# Patient Record
Sex: Female | Born: 1935 | Race: Black or African American | Hispanic: No | Smoking: Never smoker
Health system: Southern US, Community
[De-identification: ages and names within clinical notes are randomized; demographics above are authoritative.]

## PROBLEM LIST (undated history)

## (undated) DIAGNOSIS — C50919 Malignant neoplasm of unspecified site of unspecified female breast: Secondary | ICD-10-CM

## (undated) DIAGNOSIS — M2550 Pain in unspecified joint: Secondary | ICD-10-CM

## (undated) DIAGNOSIS — I4729 Other ventricular tachycardia: Secondary | ICD-10-CM

## (undated) DIAGNOSIS — I471 Supraventricular tachycardia, unspecified: Secondary | ICD-10-CM

## (undated) DIAGNOSIS — K5900 Constipation, unspecified: Secondary | ICD-10-CM

## (undated) DIAGNOSIS — I10 Essential (primary) hypertension: Secondary | ICD-10-CM

## (undated) DIAGNOSIS — I472 Ventricular tachycardia: Secondary | ICD-10-CM

## (undated) DIAGNOSIS — E7800 Pure hypercholesterolemia, unspecified: Secondary | ICD-10-CM

## (undated) DIAGNOSIS — R1013 Epigastric pain: Secondary | ICD-10-CM

## (undated) DIAGNOSIS — R42 Dizziness and giddiness: Secondary | ICD-10-CM

## (undated) DIAGNOSIS — I4510 Unspecified right bundle-branch block: Secondary | ICD-10-CM

## (undated) DIAGNOSIS — K219 Gastro-esophageal reflux disease without esophagitis: Secondary | ICD-10-CM

## (undated) HISTORY — PX: BREAST LUMPECTOMY: SHX2

## (undated) HISTORY — PX: TONSILLECTOMY: SUR1361

## (undated) HISTORY — PX: EYE SURGERY: SHX253

## (undated) HISTORY — DX: Malignant neoplasm of unspecified site of unspecified female breast: C50.919

## (undated) HISTORY — PX: CHOLECYSTECTOMY: SHX55

---

## 2000-03-21 ENCOUNTER — Encounter: Admission: RE | Admit: 2000-03-21 | Discharge: 2000-03-21 | Payer: Self-pay | Admitting: Oncology

## 2000-03-21 ENCOUNTER — Encounter: Payer: Self-pay | Admitting: Oncology

## 2000-10-10 ENCOUNTER — Other Ambulatory Visit: Admission: RE | Admit: 2000-10-10 | Discharge: 2000-10-10 | Payer: Self-pay | Admitting: Family Medicine

## 2001-03-23 ENCOUNTER — Encounter: Admission: RE | Admit: 2001-03-23 | Discharge: 2001-03-23 | Payer: Self-pay | Admitting: Oncology

## 2001-03-23 ENCOUNTER — Encounter: Payer: Self-pay | Admitting: Oncology

## 2004-06-02 ENCOUNTER — Ambulatory Visit (HOSPITAL_COMMUNITY): Admission: RE | Admit: 2004-06-02 | Discharge: 2004-06-02 | Payer: Self-pay | Admitting: Obstetrics

## 2005-03-03 ENCOUNTER — Ambulatory Visit (HOSPITAL_COMMUNITY): Admission: RE | Admit: 2005-03-03 | Discharge: 2005-03-03 | Payer: Self-pay | Admitting: Ophthalmology

## 2005-05-05 ENCOUNTER — Ambulatory Visit (HOSPITAL_COMMUNITY): Admission: RE | Admit: 2005-05-05 | Discharge: 2005-05-05 | Payer: Self-pay | Admitting: Orthopedic Surgery

## 2005-10-18 ENCOUNTER — Emergency Department (HOSPITAL_COMMUNITY): Admission: EM | Admit: 2005-10-18 | Discharge: 2005-10-18 | Payer: Self-pay | Admitting: Emergency Medicine

## 2006-06-19 ENCOUNTER — Emergency Department (HOSPITAL_COMMUNITY): Admission: EM | Admit: 2006-06-19 | Discharge: 2006-06-19 | Payer: Self-pay | Admitting: Emergency Medicine

## 2006-07-29 ENCOUNTER — Ambulatory Visit: Payer: Self-pay | Admitting: Family Medicine

## 2006-09-23 ENCOUNTER — Ambulatory Visit: Payer: Self-pay | Admitting: Family Medicine

## 2006-10-06 ENCOUNTER — Ambulatory Visit: Payer: Self-pay | Admitting: Family Medicine

## 2006-10-20 ENCOUNTER — Ambulatory Visit: Payer: Self-pay | Admitting: Family Medicine

## 2006-10-25 ENCOUNTER — Ambulatory Visit: Payer: Self-pay | Admitting: Cardiology

## 2006-11-01 ENCOUNTER — Ambulatory Visit: Payer: Self-pay

## 2006-11-08 ENCOUNTER — Ambulatory Visit: Payer: Self-pay | Admitting: Cardiology

## 2006-11-21 ENCOUNTER — Ambulatory Visit: Payer: Self-pay | Admitting: Family Medicine

## 2007-01-21 ENCOUNTER — Emergency Department (HOSPITAL_COMMUNITY): Admission: EM | Admit: 2007-01-21 | Discharge: 2007-01-21 | Payer: Self-pay | Admitting: Emergency Medicine

## 2007-01-26 ENCOUNTER — Ambulatory Visit: Payer: Self-pay | Admitting: Family Medicine

## 2008-10-29 ENCOUNTER — Emergency Department (HOSPITAL_COMMUNITY): Admission: EM | Admit: 2008-10-29 | Discharge: 2008-10-29 | Payer: Self-pay | Admitting: Emergency Medicine

## 2008-11-03 ENCOUNTER — Emergency Department (HOSPITAL_COMMUNITY): Admission: EM | Admit: 2008-11-03 | Discharge: 2008-11-03 | Payer: Self-pay | Admitting: Emergency Medicine

## 2009-05-14 ENCOUNTER — Emergency Department (HOSPITAL_COMMUNITY): Admission: EM | Admit: 2009-05-14 | Discharge: 2009-05-15 | Payer: Self-pay | Admitting: Emergency Medicine

## 2010-08-06 ENCOUNTER — Emergency Department (HOSPITAL_COMMUNITY)
Admission: EM | Admit: 2010-08-06 | Discharge: 2010-08-07 | Payer: Self-pay | Source: Home / Self Care | Admitting: Emergency Medicine

## 2010-08-06 LAB — DIFFERENTIAL
Basophils Absolute: 0 10*3/uL (ref 0.0–0.1)
Basophils Relative: 0 % (ref 0–1)
Eosinophils Absolute: 0.1 10*3/uL (ref 0.0–0.7)
Eosinophils Relative: 1 % (ref 0–5)
Lymphocytes Relative: 31 % (ref 12–46)
Lymphs Abs: 2 10*3/uL (ref 0.7–4.0)
Monocytes Absolute: 0.3 10*3/uL (ref 0.1–1.0)
Monocytes Relative: 5 % (ref 3–12)
Neutro Abs: 4 10*3/uL (ref 1.7–7.7)
Neutrophils Relative %: 63 % (ref 43–77)

## 2010-08-06 LAB — COMPREHENSIVE METABOLIC PANEL
ALT: 26 U/L (ref 0–35)
AST: 26 U/L (ref 0–37)
Albumin: 4.5 g/dL (ref 3.5–5.2)
Alkaline Phosphatase: 62 U/L (ref 39–117)
BUN: 13 mg/dL (ref 6–23)
CO2: 27 mEq/L (ref 19–32)
Calcium: 10 mg/dL (ref 8.4–10.5)
Chloride: 105 mEq/L (ref 96–112)
Creatinine, Ser: 0.78 mg/dL (ref 0.4–1.2)
GFR calc Af Amer: >60 mL/min (ref >60)
GFR calc non Af Amer: >60 mL/min (ref >60)
Glucose, Bld: 102 mg/dL — ABNORMAL HIGH (ref 70–99)
Potassium: 3.6 mEq/L (ref 3.5–5.1)
Sodium: 139 mEq/L (ref 135–145)
Total Bilirubin: 0.8 mg/dL (ref 0.3–1.2)
Total Protein: 8.5 g/dL — ABNORMAL HIGH (ref 6.0–8.3)

## 2010-08-06 LAB — POCT CARDIAC MARKERS
CKMB, poc: 6 ng/mL (ref 1.0–8.0)
Myoglobin, poc: 123 ng/mL (ref 12–200)
Troponin i, poc: <0.05 ng/mL (ref 0.00–0.09)

## 2010-08-06 LAB — CBC
HCT: 36.9 % (ref 36.0–46.0)
Hemoglobin: 12.5 g/dL (ref 12.0–15.0)
MCH: 28 pg (ref 26.0–34.0)
MCHC: 33.9 g/dL (ref 30.0–36.0)
MCV: 82.7 fL (ref 78.0–100.0)
Platelets: 155 10*3/uL (ref 150–400)
RBC: 4.46 MIL/uL (ref 3.87–5.11)
RDW: 13.9 % (ref 11.5–15.5)
WBC: 6.4 10*3/uL (ref 4.0–10.5)

## 2010-08-07 ENCOUNTER — Emergency Department (HOSPITAL_COMMUNITY)
Admission: EM | Admit: 2010-08-07 | Discharge: 2010-08-07 | Payer: Self-pay | Source: Home / Self Care | Admitting: Emergency Medicine

## 2010-11-05 LAB — LIPASE, BLOOD: Lipase: 18 U/L (ref 11–59)

## 2010-11-05 LAB — DIFFERENTIAL
Basophils Absolute: 0 10*3/uL (ref 0.0–0.1)
Basophils Relative: 1 % (ref 0–1)
Eosinophils Absolute: 0 10*3/uL (ref 0.0–0.7)
Eosinophils Relative: 0 % (ref 0–5)
Lymphocytes Relative: 22 % (ref 12–46)
Lymphs Abs: 1.3 10*3/uL (ref 0.7–4.0)
Monocytes Absolute: 0.3 10*3/uL (ref 0.1–1.0)
Monocytes Relative: 6 % (ref 3–12)
Neutro Abs: 4 10*3/uL (ref 1.7–7.7)
Neutrophils Relative %: 71 % (ref 43–77)

## 2010-11-05 LAB — COMPREHENSIVE METABOLIC PANEL
ALT: 19 U/L (ref 0–35)
AST: 20 U/L (ref 0–37)
Albumin: 4.3 g/dL (ref 3.5–5.2)
Alkaline Phosphatase: 58 U/L (ref 39–117)
BUN: 10 mg/dL (ref 6–23)
CO2: 27 mEq/L (ref 19–32)
Calcium: 9.5 mg/dL (ref 8.4–10.5)
Chloride: 103 mEq/L (ref 96–112)
Creatinine, Ser: 0.7 mg/dL (ref 0.4–1.2)
GFR calc Af Amer: >60 mL/min (ref >60)
GFR calc non Af Amer: >60 mL/min (ref >60)
Glucose, Bld: 126 mg/dL — ABNORMAL HIGH (ref 70–99)
Potassium: 3.6 mEq/L (ref 3.5–5.1)
Sodium: 136 mEq/L (ref 135–145)
Total Bilirubin: 0.6 mg/dL (ref 0.3–1.2)
Total Protein: 8.2 g/dL (ref 6.0–8.3)

## 2010-11-05 LAB — CBC
HCT: 40.6 % (ref 36.0–46.0)
Hemoglobin: 13.4 g/dL (ref 12.0–15.0)
MCHC: 32.9 g/dL (ref 30.0–36.0)
MCV: 86.8 fL (ref 78.0–100.0)
Platelets: 159 10*3/uL (ref 150–400)
RBC: 4.68 MIL/uL (ref 3.87–5.11)
RDW: 14.2 % (ref 11.5–15.5)
WBC: 5.6 10*3/uL (ref 4.0–10.5)

## 2010-12-17 ENCOUNTER — Emergency Department (HOSPITAL_COMMUNITY)
Admission: EM | Admit: 2010-12-17 | Discharge: 2010-12-17 | Disposition: A | Discharge status: Home / Self Care | Payer: Medicare Other | Attending: Emergency Medicine | Admitting: Emergency Medicine

## 2010-12-17 ENCOUNTER — Emergency Department (HOSPITAL_COMMUNITY): Payer: Medicare Other

## 2010-12-17 DIAGNOSIS — E7800 Pure hypercholesterolemia, unspecified: Secondary | ICD-10-CM | POA: Insufficient documentation

## 2010-12-17 DIAGNOSIS — D649 Anemia, unspecified: Secondary | ICD-10-CM | POA: Insufficient documentation

## 2010-12-17 DIAGNOSIS — I10 Essential (primary) hypertension: Secondary | ICD-10-CM | POA: Insufficient documentation

## 2010-12-17 DIAGNOSIS — Z79899 Other long term (current) drug therapy: Secondary | ICD-10-CM | POA: Insufficient documentation

## 2010-12-17 DIAGNOSIS — R110 Nausea: Secondary | ICD-10-CM | POA: Insufficient documentation

## 2010-12-17 DIAGNOSIS — R109 Unspecified abdominal pain: Secondary | ICD-10-CM | POA: Insufficient documentation

## 2010-12-17 LAB — DIFFERENTIAL
Basophils Absolute: 0 10*3/uL (ref 0.0–0.1)
Basophils Relative: 0 % (ref 0–1)
Eosinophils Absolute: 0 10*3/uL (ref 0.0–0.7)
Eosinophils Relative: 0 % (ref 0–5)
Lymphocytes Relative: 29 % (ref 12–46)
Lymphs Abs: 1.3 10*3/uL (ref 0.7–4.0)
Monocytes Absolute: 0.3 10*3/uL (ref 0.1–1.0)
Monocytes Relative: 6 % (ref 3–12)
Neutro Abs: 2.8 10*3/uL (ref 1.7–7.7)
Neutrophils Relative %: 64 % (ref 43–77)

## 2010-12-17 LAB — URINALYSIS, ROUTINE W REFLEX MICROSCOPIC
Bilirubin Urine: NEGATIVE
Glucose, UA: NEGATIVE mg/dL
Hgb urine dipstick: NEGATIVE
Ketones, ur: NEGATIVE mg/dL
Nitrite: NEGATIVE
Protein, ur: NEGATIVE mg/dL
Specific Gravity, Urine: 1.01 (ref 1.005–1.030)
Urobilinogen, UA: 0.2 mg/dL (ref 0.0–1.0)
pH: 7 (ref 5.0–8.0)

## 2010-12-17 LAB — COMPREHENSIVE METABOLIC PANEL
ALT: 22 U/L (ref 0–35)
AST: 26 U/L (ref 0–37)
Albumin: 4.5 g/dL (ref 3.5–5.2)
Alkaline Phosphatase: 68 U/L (ref 39–117)
BUN: 13 mg/dL (ref 6–23)
CO2: 28 mEq/L (ref 19–32)
Calcium: 10.7 mg/dL — ABNORMAL HIGH (ref 8.4–10.5)
Chloride: 100 mEq/L (ref 96–112)
Creatinine, Ser: 0.72 mg/dL (ref 0.4–1.2)
GFR calc Af Amer: >60 mL/min (ref >60)
GFR calc non Af Amer: >60 mL/min (ref >60)
Glucose, Bld: 129 mg/dL — ABNORMAL HIGH (ref 70–99)
Potassium: 3.8 mEq/L (ref 3.5–5.1)
Sodium: 136 mEq/L (ref 135–145)
Total Bilirubin: 0.6 mg/dL (ref 0.3–1.2)
Total Protein: 8.7 g/dL — ABNORMAL HIGH (ref 6.0–8.3)

## 2010-12-17 LAB — CBC
HCT: 38.7 % (ref 36.0–46.0)
Hemoglobin: 12.6 g/dL (ref 12.0–15.0)
MCH: 27.8 pg (ref 26.0–34.0)
MCHC: 32.6 g/dL (ref 30.0–36.0)
MCV: 85.4 fL (ref 78.0–100.0)
Platelets: 154 10*3/uL (ref 150–400)
RBC: 4.53 MIL/uL (ref 3.87–5.11)
RDW: 14.5 % (ref 11.5–15.5)
WBC: 4.5 10*3/uL (ref 4.0–10.5)

## 2010-12-17 LAB — URINE MICROSCOPIC-ADD ON

## 2010-12-17 LAB — LIPASE, BLOOD: Lipase: 27 U/L (ref 11–59)

## 2010-12-17 MED ORDER — IOHEXOL 300 MG/ML  SOLN
100.0000 mL | Freq: Once | INTRAMUSCULAR | Status: AC | PRN
  Administered 2010-12-17: 100 mL via INTRAVENOUS

## 2010-12-18 NOTE — Op Note (Signed)
[name], [name]             ACCOUNT NO.:  [id number]   MEDICAL RECORD NO.:  [id number]          PATIENT TYPE:  AMB   LOCATION:  DAY                           FACILITY:  [facility]   PHYSICIAN:  Trish Fountain, MD    DATE OF BIRTH:  Aug 15, 1919   DATE OF PROCEDURE:  05/05/2005  DATE OF DISCHARGE:  05/05/2005                                 OPERATIVE REPORT   PREOPERATIVE DIAGNOSIS:  Cataract, left eye.   POSTOPERATIVE DIAGNOSIS:  Cataract, left eye.   PROCEDURE:  Kelman phacoemulsification, left eye, with posterior chamber  intraocular lens, left eye.   ANESTHESIA:  MAC with topical anesthesia of the left eye.   SURGEON:  Trish Fountain, M.D.   SPECIMENS:  None.   COMPLICATIONS:  None.   INDICATIONS FOR PROCEDURE:  This is an 77 year old female with progressive  decreased vision in the left eye.   LENS MODEL:  The lens model #AMO CLRFLXC 21 diopter lens, serial  #[id number].   DESCRIPTION OF PROCEDURE:  In the preoperative area the patient had Cyclogyl  and Neo-Synephrine drops in the left eye in order to dilate the eye, along  with Tetracycline to help anesthetize the eye.  Once the patient's left eye  was dilated, the patient was taken to the operating room and prepped.  The  left eye was prepped and draped in the usual sterile manner.  A lid speculum  was placed in the left eye and a 2% Xylocaine jelly was placed in the left  eye as well.  A paracentesis was made through clear cornea at the limbus at  approximately the 11 o'clock position of the left eye.  Non-preserved  Xylocaine 1%, 1 mL was placed into the anterior chamber for one minute.  Viscoat was then used to fill the anterior chamber.  Using a 2.75 mm blade  at the 9 o'clock position, an incision into the anterior chamber was made  through clear cornea near the limbus.  Viscoat was again used to reform the  anterior chamber.  A #25 gauge bent capsulotomy needle was used to begin the  capsulorrhexis through  the anterior capsule of the lens.  Utrata forceps  were used to make a 360 degree anterior capsulorrhexis.  A Chang #27 gauge  irrigating cannula was used to hydrochlorothiazide-dissect and  hydrochlorothiazide-delineate the nucleus.  Once the hydrodissection and  hydrodelineation were carried out, [facility] phacoemulsification was used to  make a deep groove in the lens nucleus.  The lens was rotated 360 degrees  and divided into four quadrants using deep grooves made by  phacoemulsification with the [facility] phacoemulsification tip.  The nucleus  was then divided using the phaco tip and the nucleus manipulator.  The  nuclear quadrants were then removed using phacoemulsification.  The  irrigation aspiration was then used to remove the remainder of the cortex.  The anterior chamber and posterior capsule were filled with Provisc and the  9 o'clock position incision was slightly widened using the same 2.75 mm  blade that was initially used to make the incision.  An intraocular lens was  placed  in the shooter, and this was placed in the eye, followed by placement  of the trailing haptic into the posterior capsule, using the Kuglen.  The  irrigation/aspiration was then used to remove Provisc from the anterior  chamber and the posterior capsule.  BSS on a syringe was then used to  hydrate the cornea at the 9 o'clock incision site.  The incision site was  then checked for water tightness using a Weck-cel.  Half-strength Betadine  solution placed, one drop in the inner canthus and one drop in the outer  canthus.  After one minute this was rinsed from the eye.  Drops were placed  in the eye with Vigamox, followed by Nevanac, followed by Econopred.  A  shield was placed over the patient's left eye.   The patient was sent to the recovery room in satisfactory condition.      Trish Fountain, MD  Electronically Signed     PVK/MEDQ  D:  05/07/2005  T:  05/07/2005  Job:  [id number]

## 2010-12-18 NOTE — Op Note (Signed)
[name], [name]             ACCOUNT NO.:  [id number]   MEDICAL RECORD NO.:  [id number]          PATIENT TYPE:  AMB   LOCATION:  DAY                           FACILITY:  [facility]   PHYSICIAN:  Trish Fountain, MD    DATE OF BIRTH:  1919/11/07   DATE OF PROCEDURE:  03/03/2005  DATE OF DISCHARGE:                                 OPERATIVE REPORT   PREOPERATIVE DIAGNOSIS:  Cataract, right eye.   POSTOPERATIVE DIAGNOSIS:  Cataract, right eye.   SURGERY:  Kelman phacoemulsification, right eye, with posterior chamber  intraocular lens, right eye.   ANESTHESIA:  MAC with topical anesthesia of the right eye.   SURGEON:  Trish Fountain, MD   SPECIMENS:  None.   COMPLICATIONS:  None.   HISTORY:  This is an 77 year old female who has slowly progressive decrease  in vision in the right eye.   Lens model is AMO CLRFLXC 21.0 diopter lens, serial # [id number].   DESCRIPTION OF PROCEDURE:  In the preoperative area, the patient had  Cyclogyl and Neo-Synephrine drops in the right eye in order to dilate the  eye along with Tetracaine to help anesthetize the eye.  Once the patient's  right eye was dilated, the patient was taken to the operating room and  prepped.  The right eye was prepped and draped in the usual sterile manner.  A lid speculum was placed in the right eye, and 2% Xylocaine jelly was  placed in the right eye as well.  A paracentesis was made through clear  cornea at the limbus at approximately the 11 o'clock position of the right  eye.  Nonpreserved Xylocaine 1% 1 cc was placed into the anterior chamber  for one minute.  Viscoat was then used to fill the anterior chamber.  Using  a 2.75 mm blade at the 9 o'clock position, an incision into the anterior  chamber was made through clear cornea near the limbus.  Viscoat was again  used to reform the anterior chamber.  A 25 gauge bent capsulotomy needle was  used to begin the capsulorrhexis through the anterior capsule of the  lens.  Utrata forceps were used to make a 360 degree anterior capsulorrhexis.  A  Chang 27 gauge irrigating cannula was used to hydrodissect and  hydrodelineate the nucleus.  Once hydrodissection and hydrodelineation was  carried out, [facility] phacoemulsification was used to make a deep groove in  the lens nucleus.  The lens was rotated 360 degrees and divided into four  quadrants using deep grooves made by phacoemulsification with the [facility]  phacoemulsification tip.  The nucleus was then divided using the phaco tip  and the nucleus manipulator.  The nuclear quadrants were then removed using  phacoemulsification.  The irrigation aspiration was then used to remove the  remainder of the cortex.  The anterior chamber and posterior capsule was  filled with Provisc, and the 9 o'clock position incision was slightly  widened, using the same 2.75 mm blade that was initially used to make the  incision.  An intraocular lens was placed in the shooter, and this was  placed in the eye, followed by placement of the trailing haptic into the  posterior capsule, using the Kugelan.  Irrigation/aspiration was then used  to remove Provisc from the anterior chamber and the posterior capsule.  BSS  on a syringe was then used to hydrate the cornea at the 9 o'clock incision  site.  The incision site was then checked for water tightness, using a Weck-  cel.  Half-strength Betadine solution was placed, 1 drop, in the inner  canthus, and 1 drop in the outer canthus.  After one minute, this was rinsed  from the eye.  Drops were placed in the eye, Vigamox, followed by Nevanac  followed by Econopred.  A shield was placed over the patient's right eye,  and the patient was sent to the recovery room in satisfactory condition.       PVK/MEDQ  D:  03/04/2005  T:  03/05/2005  Job:  [id number]

## 2010-12-18 NOTE — Assessment & Plan Note (Signed)
[facility]                            CARDIOLOGY OFFICE NOTE   NAME:Dolin, ELHAM FINI                    MRN:          [id number]  DATE:11/08/2006                            DOB:          01-19-1920    Ms. Hirschmann comes back today to followup on her episodes of dizziness.  She was profoundly hypertension in the office and we restarted her on  amlodipine 5 mg a day. I also told her to stay well hydrated and to eat  regular meals.   She feels remarkably better, having no further spells.   We performed a stress Myoview, which showed an EF of 70% with normal  contractility and wall motion of all areas of myocardium. In some views,  it appeared that she had some attentuation in the distal anterior wall  and apex, which could have been mild ischemia. However, it was most  likely consistent with variable breast attenuation with normal wall  motion and a normal EF, it is unlikely that this is significant  ischemia.   Also performed carotid Dopplers which showed no obstructive disease with  good antegrade flow in both vertebrals.   I have had a nice chat today with Mrs. Fiala and her son, Roe Coombs. I have  asked her again to eat regular meals, to get up slowly before she starts  moving, take amlodipine 5 mg with careful monitoring of her blood  pressure (she says it is running around 130 systolic now), and to let me  know if she is having any further episodes.   I also told her the greatest fear that I have is that she could fall.  This could mean a fractured hip. I have reinforced the fact that if she  feels like she is going to faint, she should sit down or lie down as  quickly and safely as possible.   Will see her back on a p.r.n. basis.     Thomas C. Daleen Squibb, MD, [facility]  Electronically Signed    TCW/MedQ  DD: 11/08/2006  DT: 11/08/2006  Job #: [id number]   cc:   Delaney Meigs, M.D.

## 2010-12-18 NOTE — Assessment & Plan Note (Signed)
[facility]                            CARDIOLOGY OFFICE NOTE   NAME:[name], [name]                    MRN:          [id number]  DATE:10/25/2006                            DOB:          1920/04/13    I was asked by Dr. Lysbeth Galas to evaluate Maudie Flakes with episodes of  dizziness, and what she refers to as spells.   HISTORY OF PRESENT ILLNESS:  She is an 77 year old widowed African-  American female who comes with her son today for the evaluation of the  above complaints.   This has been going on for several months.  It will happen almost every  other day.  It generally happens when she has been up, doing things for  a while.  It does not seem to be related to position or changes in  posture.   She has no other symptoms including chest discomfort, shortness of  breath, diaphoresis, nausea and has never fainted with it.  She usually  will just sit down and rest for a few minutes, then she feels better.  She says it seems to be worse when she has not eaten.   She was recently put on amlodipine, unknown dose, along with her  metoprolol, for blood pressure.  Her last blood pressure in Dr. Joyce Copa  office on October 06, 2006, was 148/82.  She says this medicine made her  feel worse.   She said her blood pressure is always elevated in the doctor's office.   PAST MEDICAL HISTORY:  She has no allergies to medications.  She has no  dye allergies.  She does not smoke, drink, use any recreational drugs.  She does enjoy tea and coffee, but she says that they are decaffeinated.  She says she drinks plenty of water.  It does not sounds like she eats  on a regular basis.  She has lost her appetite.   PREVIOUS SURGERY:  Cholecystectomy and a lumpectomy, unknown dates.   FAMILY HISTORY:  Really noncontributory.   CURRENT MEDICATIONS:  1. Aspirin 81 mg daily.  2. Metoprolol 25 mg daily.  3. Benicar/HCT 40/12.5 daily.  4. Multivitamin daily.   SOCIAL  HISTORY:  She is widowed.  She has 5 children.  Her son is with  her today.   REVIEW OF SYSTEMS:  All systems reviewed and negative.   She had recent blood work which included a normal CBC, normal  electrolytes, normal TSH.  BUN, creatinine was 14 and 0.7.  Her LDL was  only 110.  Total was 184.  LFTs were normal.   PHYSICAL EXAMINATION:  Her blood pressures were checked numerous times  here in the office.  Lying it was 177/86, pulse was 69, and she is in  sinus rhythm.  Sitting, it went to 182/92 with no change in pulse.  Standing for 2 minutes, it went to 194/94 with a heart rate of 78, and  she felt a little lightheaded, and then we checked it at 5 minutes, it  was 195/96, and she was weak and lightheaded.  Her heart rate was 79 and  regular.  We then walked her around the office, and her blood pressure  was 198/98, pulse of 86 and regular.  HEENT:  Normocephalic, atraumatic.  PERRLA, extraocular movements  intact.  Arcus senilis, sclerae are slightly muddy.  Facial symmetry is  normal.  She had dentures.  NECK:  Supple.  Carotids are full without bruits.  There is no JVD.  Thyroid is not enlarged.  Trachea is midline.  LUNGS:  Clear to auscultation.  HEART:  Reveals a nondisplaced PMI.  She has normal S1, S2 with a very  faint murmur at the apex.  S2 splits.  ABDOMINAL EXAM:  Soft with good bowel sounds, no midline bruit.  There  is no hepatomegaly.  EXTREMITIES:  Reveal no edema.  Pulses are present.  NEUROLOGICAL EXAM:  Grossly intact.  SKIN:  Shows some chronic arthritic changes.   Her electrocardiogram shows sinus rhythm with a right bundle, which is  seen on previous ECG at Dr. Joyce Copa office.   ASSESSMENT:  1. Weak spells and dizziness, unknown etiology.  2. Hypertension.  I think this is poorly controlled, though she says      it may be white coat hypertension.   PLAN:  1. Adenosine Myoview to rule out any obstructive coronary disease.  2. Carotid Dopplers to  evaluate carotids as well as vertebrals.  3. Begin amlodipine 5 mg a day.  I told her this might make her feel a      little lightheaded at first, but her pressure needs to clearly come      down.  She will continue metoprolol, benazepril      hydrochlorothiazide.  I have encouraged her to drink more fluids.   I will schedule for a followup in 2 weeks to discuss tests, see how her  blood pressure is doing.     Thomas C. Daleen Squibb, MD, [facility]  Electronically Signed    TCW/MedQ  DD: 10/25/2006  DT: 10/25/2006  Job #: [id number]   cc:   Delaney Meigs, M.D.

## 2010-12-24 ENCOUNTER — Ambulatory Visit (INDEPENDENT_AMBULATORY_CARE_PROVIDER_SITE_OTHER): Payer: Medicare Other | Admitting: Internal Medicine

## 2010-12-24 DIAGNOSIS — Z7982 Long term (current) use of aspirin: Secondary | ICD-10-CM

## 2010-12-24 DIAGNOSIS — R112 Nausea with vomiting, unspecified: Secondary | ICD-10-CM

## 2011-01-18 NOTE — Consult Note (Signed)
[name], [name]             ACCOUNT NO.:  [id number]  MEDICAL RECORD NO.:  [id number]           PATIENT TYPE:  LOCATION:                                 FACILITY:  PHYSICIAN:  Lionel December, M.D.    DATE OF BIRTH:  1920-06-22  DATE OF CONSULTATION:  12/24/2010 DATE OF DISCHARGE:                                CONSULTATION   REASON FOR CONSULTATION:  Nausea.  HISTORY OF PRESENT ILLNESS:  [name] is an 77 year old black female referred to our office from Dr. Despina Hidden.  She was recently seen in the emergency room for nausea and abdominal pain.  She underwent a CT scan of the abdomen and pelvis with contrast which revealed no acute abnormalities.  She has had a cholecystectomy with the post cholecystectomy, dilatation of the common bile duct, atherosclerosis, unchanged small left inferior pole renal cyst.  She has had a calcified necrotic uterus, uterine fibroid with fluid within the superior uterine fundus.  This probably relates outflow obstruction, however, a follow up nonemergent transvaginal ultrasound recommended to further characterize. Cervical stenosis is another possibility causes endometrial fluid.  The common bile duct was not measured.  She was seen by Dr. Despina Hidden and then referred to our office.  [name] states she cannot eat chocolate or greasy foods.  At times, she becomes so nauseated.  She feels like she will pass out.  She has been seen Dr. Madilyn Fireman in [place] who is a gastroenterologist.  She saw him 5-6 weeks ago for this.  She was given a GERD diet.  When she was in the emergency department recently, she was given samples of Dexilant.  She has been on Prilosec which is actually not helped any.  She says she is not having any pain.  She usually has nausea about every other day, but she has not vomited.  She has actually had these symptoms for over 2 years.  She says she has been on multiple PPIs and they have not helped.  Her friend states [name] has  lost about 15 pounds over the past year.  She has had a colonoscopy less than 5 years ago in [place] and they reported it was normal.  She usually has one bowel movement a day.  She has had no rectal bleeding or melena.  ALLERGIES:  THERE ARE NO KNOWN ALLERGIES.  HOME MEDICATIONS:  Include 1. Norvasc 5 mg a day. 2. Dexilant 60 mg a day. 3. Crestor 20 mg a day. 4. Losartan 100 mg a day. 5. Aspirin 81 mg a day. 6. MVI one a day. 7. MiraLax once a day. 8. She is also taking lansoprazole 30 mg one a day.  SURGERIES:  She had a lumpectomy to her left breast 25 years ago and she had a cholecystectomy many years ago.  MEDICAL HISTORY:  Includes hypertension.  FAMILY HISTORY:  Mother and father are deceased, mother from a CVA, father from hypertension.  She has one brother and one sister in good health.  SOCIAL HISTORY:  She is widowed.  She is retired from [employer].  She does not smoke, drink or do drugs.  She had 5 children,  1 is deceased from lung cancer in his 30s and 4 are in good health.  OBJECTIVE:  VITAL SIGNS:  Her weight is 153, her height is 5 feet 2 inches, temperature is 98, blood pressure is 150/76, her pulse is 72. HEENT:  She has dentures.  Her oral mucosa is moist.  There are no lesions.  Her conjunctivae is pink.  Her sclerae are anicteric.  Her thyroid is normal.  There is no cervical lymphadenopathy. LUNGS:  Clear. HEART:  Regular rate and rhythm. ABDOMEN:  Soft.  Bowel sounds are positive.  No masses and there is no tenderness.  LABORATORY DATA:  Dec 17, 2010, lipase 27.  Urine was negative, small amount of leukocytes.  WBC count was 4.5, hemoglobin was 12.6, hematocrit was 38.7, MCV 85.4, platelets 154.  All the lab was normal on the CBC.  CMET, sodium 136, potassium 3.8, chloride 100, glucose 129, BUN 13, total bilirubin 0.6, ALP 68, AST 26, ALT 22, total protein was 8.7 and calcium was 10.7.  ASSESSMENT:  [name] is an 77 year old female presenting today  with nausea.  Suspect she may have peptic ulcer disease.  She has been on multiple PPIs which have not helped.  RECOMMENDATIONS: Diagnostic EGD in near future.  She will take Dexilant once a day.  Stop the Prevacid.  I would like to get H. pylori on her. I would also like for her to follow with the GERD diet that was given to her by Dr. Madilyn Fireman 6 weeks ago..  If her EGD is normal, further studies will be needed possibly a gastric emptying study.    ______________________________ Dorene Ar, NP   ______________________________ Lionel December, M.D.    TS/MEDQ  D:  12/24/2010  T:  12/25/2010  Job:  [id number] Copy to Dr. Despina Hidden  Electronically Signed by Dorene Ar PA on 12/31/2010 01:57:02 PM Electronically Signed by Lionel December M.D. on 01/18/2011 10:17:24 AM

## 2011-01-27 ENCOUNTER — Encounter (INDEPENDENT_AMBULATORY_CARE_PROVIDER_SITE_OTHER): Payer: Medicare Other | Admitting: Internal Medicine

## 2011-04-04 ENCOUNTER — Inpatient Hospital Stay (INDEPENDENT_AMBULATORY_CARE_PROVIDER_SITE_OTHER)
Admission: RE | Admit: 2011-04-04 | Discharge: 2011-04-04 | Disposition: A | Discharge status: Home / Self Care | Payer: Medicare Other | Source: Ambulatory Visit | Attending: Emergency Medicine | Admitting: Emergency Medicine

## 2011-04-04 DIAGNOSIS — H9209 Otalgia, unspecified ear: Secondary | ICD-10-CM

## 2011-04-04 DIAGNOSIS — H81399 Other peripheral vertigo, unspecified ear: Secondary | ICD-10-CM

## 2011-05-19 LAB — COMPREHENSIVE METABOLIC PANEL
ALT: 24
Calcium: 9.7
Creatinine, Ser: 0.72
GFR calc non Af Amer: >60
Glucose, Bld: 110 — ABNORMAL HIGH
Sodium: 134 — ABNORMAL LOW
Total Protein: 7.1

## 2011-05-19 LAB — URINALYSIS, ROUTINE W REFLEX MICROSCOPIC
Glucose, UA: NEGATIVE
Hgb urine dipstick: NEGATIVE
Specific Gravity, Urine: 1.02
pH: 6

## 2011-05-19 LAB — DIFFERENTIAL
Lymphocytes Relative: 20
Lymphs Abs: 1
Monocytes Relative: 7
Neutro Abs: 3.5
Neutrophils Relative %: 72

## 2011-05-19 LAB — CBC
Hemoglobin: 12.4
MCHC: 33.7
MCV: 83.4
RDW: 13.7

## 2011-05-19 LAB — URINE MICROSCOPIC-ADD ON

## 2011-07-21 ENCOUNTER — Encounter: Payer: Self-pay | Admitting: Emergency Medicine

## 2011-07-21 ENCOUNTER — Emergency Department (HOSPITAL_COMMUNITY)
Admission: EM | Admit: 2011-07-21 | Discharge: 2011-07-21 | Disposition: A | Discharge status: Home / Self Care | Payer: Medicare Other | Attending: Emergency Medicine | Admitting: Emergency Medicine

## 2011-07-21 DIAGNOSIS — I491 Atrial premature depolarization: Secondary | ICD-10-CM | POA: Insufficient documentation

## 2011-07-21 DIAGNOSIS — I10 Essential (primary) hypertension: Secondary | ICD-10-CM | POA: Insufficient documentation

## 2011-07-21 DIAGNOSIS — I4510 Unspecified right bundle-branch block: Secondary | ICD-10-CM | POA: Insufficient documentation

## 2011-07-21 DIAGNOSIS — K219 Gastro-esophageal reflux disease without esophagitis: Secondary | ICD-10-CM | POA: Insufficient documentation

## 2011-07-21 DIAGNOSIS — I498 Other specified cardiac arrhythmias: Secondary | ICD-10-CM | POA: Insufficient documentation

## 2011-07-21 HISTORY — DX: Essential (primary) hypertension: I10

## 2011-07-21 HISTORY — DX: Gastro-esophageal reflux disease without esophagitis: K21.9

## 2011-07-21 MED ORDER — ONDANSETRON HCL 4 MG/2ML IJ SOLN
4.0000 mg | Freq: Once | INTRAMUSCULAR | Status: AC
  Administered 2011-07-21: 4 mg via INTRAVENOUS
  Filled 2011-07-21: qty 2

## 2011-07-21 MED ORDER — FAMOTIDINE 20 MG PO TABS
20.0000 mg | ORAL_TABLET | Freq: Once | ORAL | Status: AC
  Administered 2011-07-21: 20 mg via ORAL
  Filled 2011-07-21: qty 1

## 2011-07-21 MED ORDER — PANTOPRAZOLE SODIUM 40 MG IV SOLR
40.0000 mg | Freq: Once | INTRAVENOUS | Status: AC
  Administered 2011-07-21: 40 mg via INTRAVENOUS
  Filled 2011-07-21: qty 40

## 2011-07-21 MED ORDER — ONDANSETRON 4 MG PO TBDP: 4.0000 mg | ORAL_TABLET | Freq: Three times a day (TID) | ORAL | Status: AC | PRN

## 2011-07-21 NOTE — ED Notes (Signed)
Pt states nausea and "feeling like I'm going to pass out at times" began last night and has continued today. Pt denies any pain.

## 2011-07-21 NOTE — ED Notes (Signed)
C/o nausea onset yesterday-denies vomiting; c/o lightheadedness ("feels like I'm going to just black out") onset this morning. Denies pain. A&ox4; answers questions appropriately; in no distress.

## 2011-07-21 NOTE — ED Provider Notes (Signed)
Scribed for [employer]. Colon Branch, MD, the patient was seen in room APA07/APA07 . This chart was scribed by Ellie Lunch.   CSN: [id number] Arrival date & time: 07/21/2011  3:10 PM   First MD Initiated Contact with Patient 07/21/11 1514      Chief Complaint  Patient presents with  . Nausea/Dizziness     (Consider location/radiation/quality/duration/timing/severity/associated sxs/prior treatment) HPI [name] is a 77 y.o. female w h/o GERD who presents to the Emergency Department complaining of 1 day of sudden onset nausea. Nausea began yesterday and has been gradually worsening since onset. Pt ate a meal of salad and chicken before nausea began. Pt denies associated fever, chills, diarrhea, difficulty urinating, and CP. Pt treated nausea with her prescribed GERD medication with no improvement.       Past Medical History  Diagnosis Date  . Hypertension   . GERD (gastroesophageal reflux disease)     Past Surgical History  Procedure Date  . Cholecystectomy   . Breast lumpectomy     left breast    No family history on file.  History  Substance Use Topics  . Smoking status: Never Smoker   . Smokeless tobacco: Not on file  . Alcohol Use: No     Review of Systems 10 Systems reviewed and are negative for acute change except as noted in the HPI.   Allergies  Review of patient's allergies indicates no known allergies.  Home Medications   Current Outpatient Rx  Name Route Sig Dispense Refill  . AMLODIPINE BESYLATE 5 MG PO TABS Oral Take 5 mg by mouth every morning.      Marland Kitchen CETIRIZINE HCL 10 MG PO TABS Oral Take 10 mg by mouth every morning.      Marland Kitchen FLUTICASONE PROPIONATE 50 MCG/ACT NA SUSP Nasal Place 2 sprays into the nose daily.      Marland Kitchen LANSOPRAZOLE 30 MG PO CPDR Oral Take 30 mg by mouth daily after breakfast.      . LOSARTAN POTASSIUM 100 MG PO TABS Oral Take 100 mg by mouth every morning.      Marland Kitchen ROSUVASTATIN CALCIUM 5 MG PO TABS Oral Take 5 mg by mouth every  morning.        BP 146/74  Pulse 113  Temp(Src) 99.2 F (37.3 C) (Oral)  Resp 16  Wt 150 lb (68.04 kg)  SpO2 100%  Physical Exam  Nursing note and vitals reviewed. Constitutional: She is oriented to person, place, and time. She appears well-developed and well-nourished. No distress.  HENT:  Head: Normocephalic and atraumatic.  Right Ear: Tympanic membrane normal.  Left Ear: Tympanic membrane normal.  Mouth/Throat: Oropharynx is clear and moist.       Upper and lower dentures  Eyes: Conjunctivae are normal. No scleral icterus.  Neck: Neck supple.  Cardiovascular: Normal rate and regular rhythm.  Exam reveals no gallop and no friction rub.   No murmur heard. Pulmonary/Chest: Effort normal and breath sounds normal. No respiratory distress. She has no wheezes. She has no rales.  Abdominal: Soft. Bowel sounds are normal. There is no tenderness.  Musculoskeletal: She exhibits no edema.  Neurological: She is alert and oriented to person, place, and time.  Skin: Skin is warm and dry.  Psychiatric: She has a normal mood and affect.    ED Course  Procedures (including critical care time) DIAGNOSTIC STUDIES: Oxygen Saturation is 100% on room air, normal by my interpretation.     Date: 07/21/2011 1523  Rate: 106  Rhythm: sinus tachycardia and premature atrial contractions (PAC)  QRS Axis: normal  Intervals: normal  ST/T Wave abnormalities: normal  Conduction Disutrbances:right bundle branch block  Narrative Interpretation:   Old EKG Reviewed: unchanged c/w 08/06/10   COORDINATION OF CARE:   3:41 PM EDP discussed with PT certain food that can aggravate GERD. Discussed that nausea is likely caused by GERD. Recommended PT supplement medication with OTC mylanta.   No diagnosis found.    MDM  Patient with symptoms of worsening GERD since last night. Has taken her PPO without relief. Non focal PE. Given protonix and pepcid with relief. WIll d/c home with instructions for use of  mylanta as a supplement to her PPO.Pt feels improved after observation and/or treatment in ED.Pt stable in ED with no significant deterioration in condition.The patient appears reasonably screened and/or stabilized for discharge and I doubt any other medical condition or other [facility] requiring further screening, evaluation, or treatment in the ED at this time prior to discharge.  I personally performed the services described in this documentation, which was scribed in my presence. The recorded information has been reviewed and considered.   MDM Reviewed: nursing note and vitals         Nicoletta Dress. Colon Branch, MD 07/21/11 1640

## 2011-07-21 NOTE — ED Notes (Signed)
Left in c/o family for transport home; instructions given and reviewed; verbalizes understanding.

## 2011-09-25 ENCOUNTER — Other Ambulatory Visit: Payer: Self-pay

## 2011-09-25 ENCOUNTER — Emergency Department (HOSPITAL_BASED_OUTPATIENT_CLINIC_OR_DEPARTMENT_OTHER): Payer: Medicare Other

## 2011-09-25 ENCOUNTER — Inpatient Hospital Stay (HOSPITAL_BASED_OUTPATIENT_CLINIC_OR_DEPARTMENT_OTHER)
Admission: EM | Admit: 2011-09-25 | Discharge: 2011-09-28 | DRG: 310 | Disposition: A | Discharge status: Home / Self Care | Payer: Medicare Other | Attending: Cardiology | Admitting: Cardiology

## 2011-09-25 ENCOUNTER — Emergency Department (INDEPENDENT_AMBULATORY_CARE_PROVIDER_SITE_OTHER): Payer: Medicare Other

## 2011-09-25 ENCOUNTER — Encounter (HOSPITAL_BASED_OUTPATIENT_CLINIC_OR_DEPARTMENT_OTHER): Payer: Self-pay | Admitting: *Deleted

## 2011-09-25 DIAGNOSIS — I4510 Unspecified right bundle-branch block: Secondary | ICD-10-CM | POA: Diagnosis present

## 2011-09-25 DIAGNOSIS — R05 Cough: Secondary | ICD-10-CM

## 2011-09-25 DIAGNOSIS — I471 Supraventricular tachycardia, unspecified: Secondary | ICD-10-CM

## 2011-09-25 DIAGNOSIS — J069 Acute upper respiratory infection, unspecified: Secondary | ICD-10-CM | POA: Diagnosis present

## 2011-09-25 DIAGNOSIS — R5383 Other fatigue: Secondary | ICD-10-CM

## 2011-09-25 DIAGNOSIS — R5381 Other malaise: Secondary | ICD-10-CM

## 2011-09-25 DIAGNOSIS — I498 Other specified cardiac arrhythmias: Principal | ICD-10-CM | POA: Diagnosis present

## 2011-09-25 DIAGNOSIS — E7800 Pure hypercholesterolemia, unspecified: Secondary | ICD-10-CM | POA: Diagnosis present

## 2011-09-25 DIAGNOSIS — I517 Cardiomegaly: Secondary | ICD-10-CM

## 2011-09-25 DIAGNOSIS — R110 Nausea: Secondary | ICD-10-CM

## 2011-09-25 DIAGNOSIS — E876 Hypokalemia: Secondary | ICD-10-CM | POA: Diagnosis present

## 2011-09-25 DIAGNOSIS — R0602 Shortness of breath: Secondary | ICD-10-CM

## 2011-09-25 DIAGNOSIS — D696 Thrombocytopenia, unspecified: Secondary | ICD-10-CM | POA: Diagnosis present

## 2011-09-25 DIAGNOSIS — K219 Gastro-esophageal reflux disease without esophagitis: Secondary | ICD-10-CM | POA: Diagnosis present

## 2011-09-25 DIAGNOSIS — D72819 Decreased white blood cell count, unspecified: Secondary | ICD-10-CM | POA: Diagnosis present

## 2011-09-25 DIAGNOSIS — I10 Essential (primary) hypertension: Secondary | ICD-10-CM

## 2011-09-25 DIAGNOSIS — E785 Hyperlipidemia, unspecified: Secondary | ICD-10-CM | POA: Diagnosis present

## 2011-09-25 DIAGNOSIS — K5900 Constipation, unspecified: Secondary | ICD-10-CM | POA: Diagnosis present

## 2011-09-25 DIAGNOSIS — R112 Nausea with vomiting, unspecified: Secondary | ICD-10-CM | POA: Diagnosis present

## 2011-09-25 HISTORY — DX: Supraventricular tachycardia, unspecified: I47.10

## 2011-09-25 HISTORY — DX: Other ventricular tachycardia: I47.29

## 2011-09-25 HISTORY — DX: Ventricular tachycardia: I47.2

## 2011-09-25 HISTORY — DX: Pure hypercholesterolemia, unspecified: E78.00

## 2011-09-25 HISTORY — DX: Unspecified right bundle-branch block: I45.10

## 2011-09-25 HISTORY — DX: Supraventricular tachycardia: I47.1

## 2011-09-25 LAB — BASIC METABOLIC PANEL
BUN: 17 mg/dL (ref 6–23)
CO2: 23 mEq/L (ref 19–32)
GFR calc non Af Amer: 55 mL/min — ABNORMAL LOW (ref >90)
Glucose, Bld: 130 mg/dL — ABNORMAL HIGH (ref 70–99)
Potassium: 3.3 mEq/L — ABNORMAL LOW (ref 3.5–5.1)
Sodium: 136 mEq/L (ref 135–145)

## 2011-09-25 LAB — CBC
Hemoglobin: 12.7 g/dL (ref 12.0–15.0)
MCV: 81.7 fL (ref 78.0–100.0)
Platelets: 92 10*3/uL — ABNORMAL LOW (ref 150–400)
RBC: 4.71 MIL/uL (ref 3.87–5.11)
RBC: 4.57 MIL/uL (ref 3.87–5.11)
RDW: 13.5 % (ref 11.5–15.5)
WBC: 2.1 10*3/uL — ABNORMAL LOW (ref 4.0–10.5)
WBC: 2.2 10*3/uL — ABNORMAL LOW (ref 4.0–10.5)

## 2011-09-25 LAB — CREATININE, SERUM
Creatinine, Ser: 0.78 mg/dL (ref 0.50–1.10)
GFR calc Af Amer: 83 mL/min — ABNORMAL LOW (ref >90)
GFR calc non Af Amer: 72 mL/min — ABNORMAL LOW (ref >90)

## 2011-09-25 LAB — CARDIAC PANEL(CRET KIN+CKTOT+MB+TROPI)
CK, MB: 4.1 ng/mL — ABNORMAL HIGH (ref 0.3–4.0)
Total CK: 160 U/L (ref 7–177)
Troponin I: <0.3 ng/mL (ref <0.30)

## 2011-09-25 LAB — MAGNESIUM: Magnesium: 1.9 mg/dL (ref 1.5–2.5)

## 2011-09-25 LAB — DIFFERENTIAL
Blasts: 0 %
Eosinophils Absolute: 0 10*3/uL (ref 0.0–0.7)
Eosinophils Relative: 0 % (ref 0–5)
Metamyelocytes Relative: 0 %
Monocytes Absolute: 0.2 10*3/uL (ref 0.1–1.0)
Monocytes Relative: 9 % (ref 3–12)
Myelocytes: 0 %
Neutro Abs: 1 10*3/uL — ABNORMAL LOW (ref 1.7–7.7)
Neutrophils Relative %: 47 % (ref 43–77)
nRBC: 0 /100 WBC

## 2011-09-25 LAB — PROCALCITONIN: Procalcitonin: <0.1 ng/mL

## 2011-09-25 LAB — PROTIME-INR
INR: 1.23 (ref 0.00–1.49)
Prothrombin Time: 15.8 seconds — ABNORMAL HIGH (ref 11.6–15.2)

## 2011-09-25 LAB — LACTIC ACID, PLASMA: Lactic Acid, Venous: 1.5 mmol/L (ref 0.5–2.2)

## 2011-09-25 LAB — MRSA PCR SCREENING: MRSA by PCR: NEGATIVE

## 2011-09-25 MED ORDER — SODIUM CHLORIDE 0.9 % IJ SOLN
3.0000 mL | Freq: Two times a day (BID) | INTRAMUSCULAR | Status: DC
  Administered 2011-09-27 (×2): 3 mL via INTRAVENOUS

## 2011-09-25 MED ORDER — SODIUM CHLORIDE 0.9 % IV SOLN: Freq: Once | INTRAVENOUS | Status: DC

## 2011-09-25 MED ORDER — SODIUM CHLORIDE 0.9 % IV SOLN: 250.0000 mL | INTRAVENOUS | Status: DC | PRN

## 2011-09-25 MED ORDER — DILTIAZEM HCL 100 MG IV SOLR: 5.0000 mg/h | Freq: Once | INTRAVENOUS | Status: DC

## 2011-09-25 MED ORDER — HEPARIN SODIUM (PORCINE) 5000 UNIT/ML IJ SOLN
5000.0000 [IU] | Freq: Three times a day (TID) | INTRAMUSCULAR | Status: DC
  Administered 2011-09-25 – 2011-09-28 (×8): 5000 [IU] via SUBCUTANEOUS
  Filled 2011-09-25 (×12): qty 1

## 2011-09-25 MED ORDER — SODIUM CHLORIDE 0.9 % IV SOLN
INTRAVENOUS | Status: DC
  Administered 2011-09-25: 20:00:00 via INTRAVENOUS

## 2011-09-25 MED ORDER — ASPIRIN EC 81 MG PO TBEC
81.0000 mg | DELAYED_RELEASE_TABLET | Freq: Every day | ORAL | Status: DC
  Administered 2011-09-26 – 2011-09-28 (×3): 81 mg via ORAL
  Filled 2011-09-25 (×3): qty 1

## 2011-09-25 MED ORDER — ATORVASTATIN CALCIUM 10 MG PO TABS
10.0000 mg | ORAL_TABLET | Freq: Every day | ORAL | Status: DC
  Administered 2011-09-25 – 2011-09-27 (×3): 10 mg via ORAL
  Filled 2011-09-25 (×4): qty 1

## 2011-09-25 MED ORDER — B COMPLEX-C PO TABS
1.0000 | ORAL_TABLET | Freq: Every day | ORAL | Status: DC
  Administered 2011-09-26 – 2011-09-28 (×3): 1 via ORAL
  Filled 2011-09-25 (×3): qty 1

## 2011-09-25 MED ORDER — ASPIRIN 81 MG PO CHEW: 324.0000 mg | CHEWABLE_TABLET | ORAL | Status: DC

## 2011-09-25 MED ORDER — DILTIAZEM HCL 25 MG/5ML IV SOLN
INTRAVENOUS | Status: AC
  Administered 2011-09-25: 10 mg via INTRAVENOUS
  Filled 2011-09-25: qty 5

## 2011-09-25 MED ORDER — SODIUM CHLORIDE 0.9 % IJ SOLN: 3.0000 mL | INTRAMUSCULAR | Status: DC | PRN

## 2011-09-25 MED ORDER — ASPIRIN 300 MG RE SUPP
300.0000 mg | RECTAL | Status: DC
  Filled 2011-09-25: qty 1

## 2011-09-25 MED ORDER — POTASSIUM CHLORIDE CRYS ER 20 MEQ PO TBCR
40.0000 meq | EXTENDED_RELEASE_TABLET | Freq: Once | ORAL | Status: AC
  Administered 2011-09-25: 40 meq via ORAL
  Filled 2011-09-25: qty 2

## 2011-09-25 MED ORDER — ADENOSINE 6 MG/2ML IV SOLN
INTRAVENOUS | Status: AC
  Filled 2011-09-25: qty 6

## 2011-09-25 MED ORDER — DILTIAZEM HCL 25 MG/5ML IV SOLN
10.0000 mg | Freq: Once | INTRAVENOUS | Status: AC
  Administered 2011-09-25: 10 mg via INTRAVENOUS

## 2011-09-25 MED ORDER — SODIUM CHLORIDE 0.9 % IV BOLUS (SEPSIS)
1000.0000 mL | Freq: Once | INTRAVENOUS | Status: AC
  Administered 2011-09-25: 1000 mL via INTRAVENOUS

## 2011-09-25 MED ORDER — ACETAMINOPHEN 325 MG PO TABS: 650.0000 mg | ORAL_TABLET | ORAL | Status: DC | PRN

## 2011-09-25 MED ORDER — DILTIAZEM HCL 100 MG IV SOLR
INTRAVENOUS | Status: AC
  Filled 2011-09-25: qty 100

## 2011-09-25 MED ORDER — DILTIAZEM HCL 100 MG IV SOLR
5.0000 mg/h | Freq: Once | INTRAVENOUS | Status: AC
  Administered 2011-09-25: 5 mg/h via INTRAVENOUS

## 2011-09-25 MED ORDER — ONDANSETRON HCL 4 MG/2ML IJ SOLN: 4.0000 mg | Freq: Four times a day (QID) | INTRAMUSCULAR | Status: DC | PRN

## 2011-09-25 MED ORDER — LOSARTAN POTASSIUM 50 MG PO TABS
100.0000 mg | ORAL_TABLET | Freq: Every day | ORAL | Status: DC
  Administered 2011-09-26 – 2011-09-28 (×3): 100 mg via ORAL
  Filled 2011-09-25 (×3): qty 2

## 2011-09-25 NOTE — H&P (Signed)
Primary Cardiologist: Dr. Valera Castle Corinda Gubler); last seen in 2008 per records  Primary Care Doctor: Dr. Lysbeth Galas  Patient Location: Room 2927  Chief Complaint: weakness/nausea  HPI:  Ms. Shackleford is a delightful 78 year old woman with a history of hypertension, GERD, and hypercholesterolemia who presents with nausea and fatigue.  Her present illness began approximately 1 week ago, on Sunday 2/17 when she developed a cough after going out in the cold the prior evening. The cough was non-productive, but persistent. She did not experience any fevers, chills, or night sweats. She developed nausea without overt abdominal pain and has had a poor appetite for most of the last week. No diarrhea or abdominal cramping. She has chronic constipation which responds to PRN Miralax. On the basis of her cough, she saw her primary care physician who prescribed an oral antibiotic on Tuesday 2/19. After taking a single dose of the prescribed antibiotic (unclear what particular medication she was prescribed), she developed significantly worsened nausea/vomiting and more fatigue. She stopped after one dose and began a course of Azithromycin (5 day Z-pack) on Wednesday 2/20. Wile she feels better than on Tuesday, her nausea persisted, as has her sense of fatigue. On the basis of continued nausea and poor oral intake, she presented to [facility] center for evaluation this afternoon. Of note, the patient denies any chest pain or discomfort, and similarly denies shortness of breath during the past week. No palpitations. No lightheadedness or orthostatic symptoms. No syncope. No PND, orthopnea, or leg swelling. No headaches or myalgias. No joint pain or skin rashes that she's noticed.   At [facility], she was found to be profoundly tachycardic, with a normal blood pressure. She was given a liter of normal saline and IV Diltiazem as a bolus followed by continuous infusion. After improvement in her heart rate,  she was subsequent transferred to [facility] for further evaluation and management.   Upon arrival to [facility], she reports feeling "much better than earlier today." She is without acute complaints. She currently denies nausea, chest pain, or shortness of breath. She is actually feeling hungry for the first time this week.   Past Medical History Hypertension Hypercholesterolemia GERD S/p left breast lumpectomy S/p cholecystectomy  Family History: Non-contributory to this presentation  Social History: Widow; with children & family in the area 7 family members accompanied her to [facility] from [employer] Lives alone and independently in her own home  Review of Systems: As per HPI; otherwise is comprehensively negative  Allergies: No Known Allergies  Medications (home): Aspirin 81mg  Amlodipine 5 mg Losartan 100 mg Rosuvastatin 5 mg Lansoprazole 30 mg  Cetirizine 10 mg daily (Zyrtec) Fluticasone 50 mcg nasal spray 2 sprays daily Vitamin B/C complex Fish Oil 1000 mg  Azithromycin 250mg  for 2 more days (through Monday 2/25)  Medications on transfer from [facility]: Diltiazem IV infusion S/p single dose potassium for replacement of K (3.3) S/p normal saline bolus (1 liter)  Exam:  Afebrile  HR  71 BP 145/88 RR 14 100% RA  GEN: no acute distress, no accessory muscle use HEENT: JVP 8cm H2O, no carotid bruits  CHEST: clear to auscultation bilaterally; no wheezing, no crackles CARDIAC: S1 S2, no murmurs/rubs; no RV heave; no rub  ABDOMEN: soft, non-tender, no bruits; normal bowel sounds EXT: warm, well-perfused, no edema NEURO: alert & oriented x3; no focal deficits detected SKIN: warm & dry  Select Labs:  WBC 2.1 Hgb 13  Plts 92K  Na  136  K 3.3 HCO3 23  BUN 17 Cr 0.9 Ca 9.4 Lactate 1.5 Troponin < 0.30   CXR: no evident infiltrates, effusions or edema  ECGs:  From [facility]: 1433 RBBB (QRS~130) with HR on 171; regular without evident P-wave activity though P's may  be buried under T waves; no obvious ST segment abnormalities 1438 RBBB; HR 165; otherwise same as at 1433 1440 normal sinus rhythm with 1st degree AV block (PR~210); RBBB; HR ~100 with rhytm strip at the end showing an abrupt transition back to tachycardia with loss of clearly defined P waves 1450 normal sinus; HR 85; RBBB; 1st degree AV block   Assessment/Plan  78 year old admirably-independent woman who presents after a week of what sounds most like a viral syndrome with associated cough and periodic nausea. Based on her clinical history and presentation to [facility], I suspect that she developed an SVT secondary to nausea and a degree of dehydration for poor PO intake from a viral syndrome this week. Out of an abundance of understandable caution, she was initiated on an oral antibiotic by her primary care physician this week. Reassuringly, based on a clear lung exam together with a reasonably benign looking chest radiograph and her general appearance absent fevers or hypoxia, I do not think she has a pneumonia. Instead, she most likely experienced a URI this week.   In the context of her SVT paroxysms earlier this afternoon: her exam offers no evidence of volume overload and her cardiovascular bedside exam is normal. I have no reason to suspect underlying  structural heart disease. I suspect that volume depletion and hypokalemia may have lowered her threshold. Thus, I think the primary management approach should focus on re-establishment of euvolemia, electrolyte replacement as needed, and encouragement of PO intake.  1) SVT paroxysms likely secondary to volume depletion and resolving viral syndrome - Received 1 liter normal saline prior to transfer from [facility]; will give her 100cc/hr of normal saline for a total of 1 liter overnight - Will encourage PO intake for maintenance of hydration, with plan to wean IV fluids by tomorrow (Sunday) - Currently on Diltiazem infusion at 5mg /hour. Once she is  volume replete, we can likely discontinue the drip tomorrow (Sunday). Her current medication regimen does not currently include a nodal agent. I am not yet convinced that she will ultimately need a nodal agent, since the underlying cause of her SVT paroxysms seems related to an acute, resolving trigger in the form of her dehydration & viral-syndrome. Should her SVT re-cur, or if there is concern for its recurrence going forward, then perhaps her anti-hypertensive regimen can be modified to include a nodal agent. Specifically, her amlodipine could be switched to a low, long-acting dose of Diltiazem. Alternatively, a low-dose beta-blocker could be employed.   2) Hypertension - Continue home regimen, including Losartan 100mg  daily - Currently on Diltiazem infusion. As I discuss above, I think she can be transitioned back to her home Amlodipine dose, perhaps as early as tomorrow (Sunday), depending on how she does overnight from the standpoint of SVT paroxysms.   3) Hyperlipidemia - Continue low-dose statin therapy and low-dose aspirin for primary prevention - I do not think her current symptoms are as a result of her statin  4) GERD - Continue Lansoprazole therapy per her home dose   I discussed my impressions with the patient who voice understanding. Her questions were answered to the best of my ability.  Zacarias Pontes, MD Cardiology Fellow On-Call [phone]

## 2011-09-25 NOTE — ED Notes (Addendum)
Called son Zeniyah Peaster and informed him of patient's room assignment

## 2011-09-25 NOTE — ED Notes (Signed)
Patient states that she is completing her second antibiotic for pneumonia symptoms developed last weekend, antibiotic changed since the first one made her nauseous, patient states that she was feeling weak all weak and nauseous today, during ekg patient changed from svt, to afib to NSR, nausea has subsided at this time

## 2011-09-25 NOTE — ED Notes (Addendum)
Pt states she got sick last Sunday and saw the Dr. On Tuesday. Dx'd with pneumonia and placed on several different meds, the last one being Azithromycin. Color pale. BBS diminished. Not taking deep breaths. Sat 100% at triage.

## 2011-09-25 NOTE — H&P (Signed)
CARDIOLOGY ADMISSION NOTE  Patient ID: [name] [name] MRN: [id number] DOB/AGE: 11/29/1919 78 y.o.  Admit date: 09/25/2011 Primary Physician  Dr. Lysbeth Galas Primary Cardiologist   None Chief Complaint    Cough, N/V, weakness  HPI: Please see Dr. Seward Meth H&P  Past Medical History  Diagnosis Date  . Hypertension   . GERD (gastroesophageal reflux disease)     Past Surgical History  Procedure Date  . Cholecystectomy   . Breast lumpectomy     left breast    No Known Allergies    Current Outpatient Prescriptions on File Prior to Encounter  Medication Sig Dispense Refill  . amLODipine (NORVASC) 5 MG tablet Take 5 mg by mouth every morning.        . cetirizine (ZYRTEC) 10 MG tablet Take 10 mg by mouth daily as needed. For allergies      . fluticasone (FLONASE) 50 MCG/ACT nasal spray Place 2 sprays into the nose daily.        . lansoprazole (PREVACID) 30 MG capsule Take 30 mg by mouth daily after breakfast.        . losartan (COZAAR) 100 MG tablet Take 100 mg by mouth every morning.        . rosuvastatin (CRESTOR) 5 MG tablet Take 5 mg by mouth every morning.         Social History: Widow, denies any smoking, alcohol, or illicit drugs  No pertinent family history.    Physical Exam: Blood pressure 153/117, pulse 81, temperature 98.8 F (37.1 C), temperature source Oral, resp. rate 13, height 5\' 3"  (1.6 m), weight 151 lb (68.493 kg), SpO2 100.00%.   please see Dr. Seward Meth H&P.   Labs: Lab Results  Component Value Date   BUN 17 09/25/2011   Lab Results  Component Value Date   CREATININE 0.90 09/25/2011   Lab Results  Component Value Date   NA 136 09/25/2011   K 3.3* 09/25/2011   CL 100 09/25/2011   CO2 23 09/25/2011   Lab Results  Component Value Date   TROPONINI <0.30 09/25/2011   Lab Results  Component Value Date   WBC 2.1* 09/25/2011   HGB 13.0 09/25/2011   HCT 38.5 09/25/2011   MCV 81.7 09/25/2011   PLT 92* 09/25/2011   No results found for this basename: CHOL,  HDL, LDLCALC, LDLDIRECT, TRIG, CHOLHDL   Lab Results  Component Value Date   ALT 22 12/17/2010   AST 26 12/17/2010   ALKPHOS 68 12/17/2010   BILITOT 0.6 12/17/2010      Radiology: CXR 09/25/11:   Findings: Cardiac silhouette mildly enlarged but stable. Thoracic  aorta atherosclerotic, unchanged. Hilar and mediastinal contours  otherwise unremarkable. Lungs clear. Bronchovascular markings  normal. Pulmonary vascularity normal. No pneumothorax. No  pleural effusions. Surgical clips in the left axilla from prior  node dissection.  IMPRESSION:  Stable mild cardiomegaly. No acute cardiopulmonary disease.   ASSESSMENT AND PLAN:    1. SVT/Atrial fibrillation: -Admit to SDU -cycle cardiac enzymes x3 q8hrs -Repeat EKG in AM -Check TSH  2. Leukopenia: WBC 2.1, baseline is normal. This is likely 2/2 post infectious vs. Drug-induced since patient recently received abx as well as having symptoms of URI/gastritis with cough, N/V. -Will continue to monitor CBC  3. Thrombocytopenia- Platelet 92.  Also her baseline has been normal.  Unclear etiology at this time.  Likely from post-infection. No signs of active bleeding at this time. -Will monitor CBC  4. Hypokalemia- likely 2/2 poor oral intake.  -  Will replete with KCL x 2 doses,  -Check magnesium -continue to monitor her BMP  5. HTN-well controlled with Diltiazem gtt  6. GERD-stable, will start Protonix 40mg  IV bid    Signed: Annalysia Willenbring 09/25/2011, 4:29 PM

## 2011-09-25 NOTE — ED Provider Notes (Signed)
History     CSN: [id number]  Arrival date & time 09/25/11  1348   First MD Initiated Contact with Patient 09/25/11 1415      Chief Complaint  Patient presents with  . Cough    (Consider location/radiation/quality/duration/timing/severity/associated sxs/prior treatment) HPI Comments: Patient presents with cough over the last week and she has been evaluated by her primary care physician Dr. Lysbeth Galas and his nurse practitioner.  She was initially placed on an antibiotic that she does not know the name of but gave her significant nausea and vomiting so that was stopped and changed to azithromycin.  Patient is now been on the azithromycin but no she's had some continued nausea and emesis.  No diarrhea.  No specific abdominal pain.  She's had some nonproductive cough but does not complaining of shortness of breath or chest pain.  She just feels tired and "sick".  No significant smoking history and no asthma or emphysema history.  Denies any cardiac history.  Patient is a 78 y.o. female presenting with cough. The history is provided by the patient. No language interpreter was used.  Cough This is a new problem. The current episode started more than 2 days ago. The problem occurs every few minutes. The problem has not changed since onset.The cough is non-productive. There has been no fever. Pertinent negatives include no chest pain, no chills, no sweats, no weight loss, no ear congestion, no ear pain, no headaches, no rhinorrhea, no sore throat, no myalgias, no shortness of breath, no wheezing and no eye redness. She has tried cough syrup for the symptoms. She is not a smoker. Her past medical history is significant for pneumonia.    Past Medical History  Diagnosis Date  . Hypertension   . GERD (gastroesophageal reflux disease)     Past Surgical History  Procedure Date  . Cholecystectomy   . Breast lumpectomy     left breast    History reviewed. No pertinent family history.  History    Substance Use Topics  . Smoking status: Never Smoker   . Smokeless tobacco: Not on file  . Alcohol Use: No    OB History    Grav Para Term Preterm Abortions TAB SAB Ect Mult Living                  Review of Systems  Constitutional: Positive for fatigue. Negative for fever, chills and weight loss.  HENT: Negative.  Negative for ear pain, sore throat and rhinorrhea.   Eyes: Negative.  Negative for discharge and redness.  Respiratory: Positive for cough. Negative for shortness of breath and wheezing.   Cardiovascular: Negative.  Negative for chest pain.  Gastrointestinal: Positive for nausea and vomiting. Negative for abdominal pain and diarrhea.  Genitourinary: Negative.  Negative for dysuria and vaginal discharge.  Musculoskeletal: Negative.  Negative for myalgias and back pain.  Skin: Negative.  Negative for color change and rash.  Neurological: Negative.  Negative for syncope and headaches.  Hematological: Negative.  Negative for adenopathy.  Psychiatric/Behavioral: Negative.  Negative for confusion.  All other systems reviewed and are negative.    Allergies  Review of patient's allergies indicates no known allergies.  Home Medications   Current Outpatient Rx  Name Route Sig Dispense Refill  . AZITHROMYCIN 250 MG PO TABS Oral Take 250 mg by mouth daily.    Marland Kitchen HYDROCODONE-HOMATROPINE 5-1.5 MG/5ML PO SYRP Oral Take 5 mLs by mouth every 6 (six) hours as needed.    Marland Kitchen AMLODIPINE BESYLATE  5 MG PO TABS Oral Take 5 mg by mouth every morning.      Marland Kitchen CETIRIZINE HCL 10 MG PO TABS Oral Take 10 mg by mouth every morning.      Marland Kitchen FLUTICASONE PROPIONATE 50 MCG/ACT NA SUSP Nasal Place 2 sprays into the nose daily.      Marland Kitchen LANSOPRAZOLE 30 MG PO CPDR Oral Take 30 mg by mouth daily after breakfast.      . LOSARTAN POTASSIUM 100 MG PO TABS Oral Take 100 mg by mouth every morning.      Marland Kitchen ROSUVASTATIN CALCIUM 5 MG PO TABS Oral Take 5 mg by mouth every morning.        BP 153/106  Pulse 116   Temp(Src) 98.8 F (37.1 C) (Oral)  Resp 28  Ht 5\' 3"  (1.6 m)  Wt 151 lb (68.493 kg)  BMI 26.75 kg/m2  SpO2 100%  Physical Exam  Nursing note and vitals reviewed. Constitutional: She is oriented to person, place, and time. She appears well-developed and well-nourished.  Non-toxic appearance. She does not have a sickly appearance.  HENT:  Head: Normocephalic and atraumatic.  Eyes: Conjunctivae, EOM and lids are normal. Pupils are equal, round, and reactive to light. No scleral icterus.  Neck: Trachea normal and normal range of motion. Neck supple.  Cardiovascular: Regular rhythm, S1 normal, S2 normal and normal heart sounds.  Tachycardia present.   Pulmonary/Chest: Effort normal and breath sounds normal. No respiratory distress. She has no wheezes. She has no rales.  Abdominal: Soft. Normal appearance. There is no tenderness. There is no rebound, no guarding and no CVA tenderness.  Musculoskeletal: Normal range of motion.  Neurological: She is alert and oriented to person, place, and time. She has normal strength.  Skin: Skin is warm, dry and intact. No rash noted.  Psychiatric: She has a normal mood and affect. Her behavior is normal. Judgment and thought content normal.    ED Course  Procedures (including critical care time)  Results for orders placed during the hospital encounter of 09/25/11  CBC      Component Value Range   WBC 2.1 (*) 4.0 - 10.5 (K/uL)   RBC 4.71  3.87 - 5.11 (MIL/uL)   Hemoglobin 13.0  12.0 - 15.0 (g/dL)   HCT 16.1  09.6 - 04.5 (%)   MCV 81.7  78.0 - 100.0 (fL)   MCH 27.6  26.0 - 34.0 (pg)   MCHC 33.8  30.0 - 36.0 (g/dL)   RDW 40.9  81.1 - 91.4 (%)   Platelets 92 (*) 150 - 400 (K/uL)  DIFFERENTIAL      Component Value Range   Neutrophils Relative PENDING  43 - 77 (%)   Neutro Abs PENDING  1.7 - 7.7 (K/uL)   Band Neutrophils PENDING  0 - 10 (%)   Lymphocytes Relative PENDING  12 - 46 (%)   Lymphs Abs PENDING  0.7 - 4.0 (K/uL)   Monocytes Relative  PENDING  3 - 12 (%)   Monocytes Absolute PENDING  0.1 - 1.0 (K/uL)   Eosinophils Relative PENDING  0 - 5 (%)   Eosinophils Absolute PENDING  0.0 - 0.7 (K/uL)   Basophils Relative PENDING  0 - 1 (%)   Basophils Absolute PENDING  0.0 - 0.1 (K/uL)   WBC Morphology PENDING     RBC Morphology PENDING     Smear Review PENDING     nRBC PENDING  0 (/100 WBC)   Metamyelocytes Relative PENDING  Myelocytes PENDING     Promyelocytes Absolute PENDING     Blasts PENDING    TROPONIN I      Component Value Range   Troponin I <0.30  <0.30 (ng/mL)  LACTIC ACID, PLASMA      Component Value Range   Lactic Acid, Venous 1.5  0.5 - 2.2 (mmol/L)   Dg Chest Portable 1 View  09/25/2011  *RADIOLOGY REPORT*  Clinical Data: Severe nausea and shortness of breath.  PORTABLE CHEST - 1 VIEW 09/25/2011 1455 hours:  Comparison: Two-view chest x-ray 12/17/2010 and portable chest x- ray 10/18/2005 [facility].  Findings: Cardiac silhouette mildly enlarged but stable.  Thoracic aorta atherosclerotic, unchanged.  Hilar and mediastinal contours otherwise unremarkable.  Lungs clear.  Bronchovascular markings normal.  Pulmonary vascularity normal.  No pneumothorax.  No pleural effusions.  Surgical clips in the left axilla from prior node dissection.  IMPRESSION: Stable mild cardiomegaly.  No acute cardiopulmonary disease.  Original Report Authenticated By: Arnell Sieving, M.D.      Date: 09/25/2011  1433  Rate: 171  Rhythm: supraventricular tachycardia (SVT)  QRS Axis: left  Intervals: normal  ST/T Wave abnormalities: ST depression in anterior leads  Conduction Disutrbances:right bundle branch block  Narrative Interpretation:   Old EKG Reviewed: changes noted from 07/21/2011 when patient was in the sinus tachycardia with right bundle branch block   Date: 09/25/2011  1438  Rate: 165  Rhythm: supraventricular tachycardia (SVT)  QRS Axis: left  Intervals: normal  ST/T Wave abnormalities: ST depressions  anteriorly  Conduction Disutrbances:right bundle branch block  Narrative Interpretation:   Old EKG Reviewed: unchanged from prior EKG   Date: 09/25/2011  1440  Rate: 111  Rhythm: sinus tachycardia initially transitioning to SVT  QRS Axis: left  Intervals: normal  ST/T Wave abnormalities: normal  Conduction Disutrbances:right bundle branch block  Narrative Interpretation:   Old EKG Reviewed: changes noted from prior EKG with SVT   Date: 09/25/2011   1448  Rate: 86  Rhythm: normal sinus rhythm  QRS Axis: left  Intervals: normal  ST/T Wave abnormalities: normal  Conduction Disutrbances:right bundle branch block  Narrative Interpretation:   Old EKG Reviewed: changes noted from prior EKG        MDM  Patient presented with symptoms initially concerning for possible pneumonia but as patient discussed further symptoms of mild nausea that was intermittent and is not associated with abdominal pain that became concerning for atypical presentation for ACS.  As the tech was performing the EKG she noted the patient to be in a heart rate of the 160s and what appeared to be SVT.  Patient then showed some varying heart rates between 160s to the 90s spontaneously without further intervention.  At the lower heart rates patient would be in a normal sinus rhythm.  It is unclear if the patient may have truly been in A. fib with RVR rather than SVT.  Therefore rather than give patient adenosine as she often was dropping her heart rate to the 90s I gave her diltiazem and place her on a diltiazem drip to maintain a sinus rhythm in the 80s.  On 5 mg per hour the patient has maintained a normal heart rate and normal blood pressure and feels no more nausea.  Notably the patient was nauseous when she had the tachycardic episodes.  Patient has a clear chest x-ray and so I do not feel further antibiotics for pneumonia are necessary at this time if she has no infiltrate.  Notably patient also has no leukocytosis and  is not febrile here.  Patient's cardiac markers are negative at this time.  There is no acute signs of acute ischemia on her EKG.  I did discuss this patient with Dr. Diona Browner from [facility] cardiology at [facility] and the patient will  be transferred to a medical step down bed at [facility] admitted to the cardiology service for further evaluation and management of these tachycardic episodes.  Patient was found to be hypokalemic and will have potassium given to raise her potassium to the normal range.   CRITICAL CARE Performed by: Emeline General A   Total critical care time: 40 minutes  Critical care time was exclusive of separately billable procedures and treating other patients.  Critical care was necessary to treat or prevent imminent or life-threatening deterioration.  Critical care was time spent personally by me on the following activities: development of treatment plan with patient and/or surrogate as well as nursing, discussions with consultants, evaluation of patient's response to treatment, examination of patient, obtaining history from patient or surrogate, ordering and performing treatments and interventions, ordering and review of laboratory studies, ordering and review of radiographic studies, pulse oximetry and re-evaluation of patient's condition.        Nat Christen, MD 09/25/11 1556

## 2011-09-26 DIAGNOSIS — I471 Supraventricular tachycardia, unspecified: Secondary | ICD-10-CM

## 2011-09-26 DIAGNOSIS — J069 Acute upper respiratory infection, unspecified: Secondary | ICD-10-CM

## 2011-09-26 DIAGNOSIS — I359 Nonrheumatic aortic valve disorder, unspecified: Secondary | ICD-10-CM

## 2011-09-26 DIAGNOSIS — I4510 Unspecified right bundle-branch block: Secondary | ICD-10-CM

## 2011-09-26 DIAGNOSIS — I10 Essential (primary) hypertension: Secondary | ICD-10-CM

## 2011-09-26 LAB — BASIC METABOLIC PANEL
Calcium: 8.6 mg/dL (ref 8.4–10.5)
Chloride: 106 mEq/L (ref 96–112)
Creatinine, Ser: 0.82 mg/dL (ref 0.50–1.10)
GFR calc Af Amer: 71 mL/min — ABNORMAL LOW (ref >90)
Sodium: 139 mEq/L (ref 135–145)

## 2011-09-26 LAB — CBC
Platelets: 109 10*3/uL — ABNORMAL LOW (ref 150–400)
RDW: 13.8 % (ref 11.5–15.5)
WBC: 2.3 10*3/uL — ABNORMAL LOW (ref 4.0–10.5)

## 2011-09-26 LAB — CARDIAC PANEL(CRET KIN+CKTOT+MB+TROPI): Total CK: 133 U/L (ref 7–177)

## 2011-09-26 LAB — TSH: TSH: 0.666 u[IU]/mL (ref 0.350–4.500)

## 2011-09-26 MED ORDER — POTASSIUM CHLORIDE CRYS ER 20 MEQ PO TBCR
40.0000 meq | EXTENDED_RELEASE_TABLET | Freq: Once | ORAL | Status: AC
  Administered 2011-09-26: 40 meq via ORAL
  Filled 2011-09-26: qty 2

## 2011-09-26 MED ORDER — PNEUMOCOCCAL VAC POLYVALENT 25 MCG/0.5ML IJ INJ
0.5000 mL | INJECTION | INTRAMUSCULAR | Status: AC
  Filled 2011-09-26 (×2): qty 0.5

## 2011-09-26 MED ORDER — DEXTROSE 5 % IV SOLN
5.0000 mg/h | INTRAVENOUS | Status: DC
  Administered 2011-09-26: 5 mg/h via INTRAVENOUS

## 2011-09-26 NOTE — Progress Notes (Signed)
  Echocardiogram 2D Echocardiogram has been performed.  Joelene Barriere, Real Cons 09/26/2011, 4:07 PM

## 2011-09-26 NOTE — Progress Notes (Signed)
Subjective: Feels better this morning, no cough or shortness of breath, no palpitations or chest pain.   Objective: Temp:  [98.4 F (36.9 C)-99.5 F (37.5 C)] 98.8 F (37.1 C) (02/24 0850) Pulse Rate:  [59-116] 64  (02/24 0646) Resp:  [9-28] 18  (02/24 0646) BP: (99-153)/(50-117) 116/52 mmHg (02/24 0646) SpO2:  [97 %-100 %] 97 % (02/24 0646) Weight:  [151 lb (68.493 kg)-154 lb 12.2 oz (70.2 kg)] 154 lb 12.2 oz (70.2 kg) (02/24 0616)  I/O last 3 completed shifts: In: 1085 [I.V.:1085] Out: 400 [Urine:400]  Telemetry - Normal sinus rhythm, no SVT or AF.  Exam -   General - NAD.  Lungs - Coarse but clear, nonlabored.  Cardiac - Regular rate and rhythm, very soft systolic murmur.  Abdomen - NABS.  Extremities - No pitting.  Testing -   Lab Results  Component Value Date   WBC 2.3* 09/26/2011   HGB 11.4* 09/26/2011   HCT 34.5* 09/26/2011   MCV 84.8 09/26/2011   PLT 109* 09/26/2011    Lab Results  Component Value Date   CREATININE 0.82 09/26/2011   BUN 15 09/26/2011   NA 139 09/26/2011   K 3.4* 09/26/2011   CL 106 09/26/2011   CO2 24 09/26/2011    Lab Results  Component Value Date   CKTOTAL 133 09/25/2011   CKMB 3.5 09/25/2011   TROPONINI <0.30 09/25/2011    ECG - Sinus rhythm with right bundle branch block, PR interval 194 ms.   Current Medications    . sodium chloride   Intravenous Once  . aspirin EC  81 mg Oral Daily  . atorvastatin  10 mg Oral q1800  . B-complex with vitamin C  1 tablet Oral Daily  . diltiazem (CARDIZEM) infusion  5 mg/hr Intravenous Once  . diltiazem (CARDIZEM) infusion  5 mg/hr Intravenous Once  . diltiazem      . diltiazem  10 mg Intravenous Once  . heparin  5,000 Units Subcutaneous Q8H  . losartan  100 mg Oral Daily  . potassium chloride  40 mEq Oral Once  . sodium chloride  1,000 mL Intravenous Once  . sodium chloride  3 mL Intravenous Q12H  . DISCONTD: aspirin  324 mg Oral NOW  . DISCONTD: aspirin  300 mg Rectal NOW     Assessment:  1. Probable PSVT rather than atrial fibrillation or atrial flutter with 2:1 conduction. Heart rhythm was regular and in the 160s to 170s at times, which would be fast for 2:1 conduction in an 78 year old. This has been quiescent during observation at [facility], on Cardizem infusion. Patient reports no previous history of palpitations or dysrhythmia. Cardiac markers argue against any associated acute coronary syndrome. She has a right bundle branch block and borderline prolonged PR interval at baseline.  2. Probable upper respiratory tract infection over the last week, perhaps contributing to problem #1. No infiltrate by chest x-ray, suspect viral etiology most likely.  3. Hypertension, on Norvasc and Cozaar as an outpatient.  Plan:  Appreciate the complete note of Dr. Maryelizabeth Kaufmann. Patient has been hydrated overnight, feels better this morning. Plan is to discontinue Cardizem infusion, transfer her to telemetry, and have her ambulate in the halls some today. 2-D echocardiogram will also be arranged to assess cardiac structure and function. If she has no more PSVT under observation, anticipate that she could be discharged home tomorrow, and arrange followup in the [place] office. It is not entirely clear that she will require an AV nodal  blocking agent, although if PSVT continues, would suggest switching from Norvasc to Cardizem CD.  Jonelle Sidle, M.D., F.A.C.C.

## 2011-09-27 ENCOUNTER — Other Ambulatory Visit: Payer: Self-pay

## 2011-09-27 DIAGNOSIS — I498 Other specified cardiac arrhythmias: Principal | ICD-10-CM

## 2011-09-27 LAB — CBC
MCH: 27.4 pg (ref 26.0–34.0)
Platelets: 108 10*3/uL — ABNORMAL LOW (ref 150–400)
RBC: 4.45 MIL/uL (ref 3.87–5.11)
WBC: 3.4 10*3/uL — ABNORMAL LOW (ref 4.0–10.5)

## 2011-09-27 LAB — BASIC METABOLIC PANEL
CO2: 25 mEq/L (ref 19–32)
Calcium: 9.2 mg/dL (ref 8.4–10.5)
Chloride: 106 mEq/L (ref 96–112)
Sodium: 140 mEq/L (ref 135–145)

## 2011-09-27 LAB — MAGNESIUM: Magnesium: 1.7 mg/dL (ref 1.5–2.5)

## 2011-09-27 MED ORDER — GUAIFENESIN ER 600 MG PO TB12
600.0000 mg | ORAL_TABLET | Freq: Two times a day (BID) | ORAL | Status: DC
  Administered 2011-09-27 – 2011-09-28 (×3): 600 mg via ORAL
  Filled 2011-09-27 (×4): qty 1

## 2011-09-27 MED ORDER — HYDRALAZINE HCL 20 MG/ML IJ SOLN
5.0000 mg | Freq: Four times a day (QID) | INTRAMUSCULAR | Status: DC | PRN
  Filled 2011-09-27: qty 0.25

## 2011-09-27 MED ORDER — POTASSIUM CHLORIDE CRYS ER 20 MEQ PO TBCR
20.0000 meq | EXTENDED_RELEASE_TABLET | Freq: Two times a day (BID) | ORAL | Status: DC
  Administered 2011-09-27 (×2): 20 meq via ORAL
  Filled 2011-09-27 (×4): qty 1

## 2011-09-27 MED ORDER — METOPROLOL SUCCINATE 12.5 MG HALF TABLET
12.5000 mg | ORAL_TABLET | Freq: Every day | ORAL | Status: DC
  Administered 2011-09-27 – 2011-09-28 (×2): 12.5 mg via ORAL
  Filled 2011-09-27 (×2): qty 1

## 2011-09-27 MED ORDER — AMLODIPINE BESYLATE 5 MG PO TABS
5.0000 mg | ORAL_TABLET | Freq: Every day | ORAL | Status: DC
  Administered 2011-09-28: 5 mg via ORAL
  Filled 2011-09-27: qty 1

## 2011-09-27 NOTE — Progress Notes (Signed)
CARDIAC REHAB PHASE I   PRE:  Rate/Rhythm: 70SR  BP:  Supine:   Sitting: 100/66  Standing:    SaO2: 98%RA  MODE:  Ambulation: 352 ft   POST:  Rate/Rhythem: 78 SR  BP:  Supine:   Sitting: 140/60  Standing:    SaO2: 99%RA 1255-1320 Pt walked 352 ft on RA with hand held asst with steady gait. Tolerated well. To chair after walk. Call bell in reach.  Duanne Limerick

## 2011-09-27 NOTE — Progress Notes (Signed)
UR Completed. [name], Maureen Lannan F [phone]  

## 2011-09-27 NOTE — Progress Notes (Signed)
Subjective:  The patient feels a little better today.  She is still coughing.  Her nausea has improved.  She has not yet walked in the hall.  Telemetry yesterday showed a short run of monomorphic ventricular tachycardia in the setting of hypokalemia.  She has had no further ventricular tachycardia overnight.  She is having frequent PVCs today.  Objective:  Vital Signs in the last 24 hours: Temp:  [98 F (36.7 C)-98.9 F (37.2 C)] 98 F (36.7 C) (02/25 0641) Pulse Rate:  [69-76] 76  (02/25 0641) Resp:  [18-22] 19  (02/25 0641) BP: (119-143)/(57-81) 143/76 mmHg (02/25 0641) SpO2:  [99 %] 99 % (02/25 0641)  Intake/Output from previous day: 02/24 0701 - 02/25 0700 In: 320 [P.O.:320] Out: 575 [Urine:575] Intake/Output from this shift:       . sodium chloride   Intravenous Once  . aspirin EC  81 mg Oral Daily  . atorvastatin  10 mg Oral q1800  . B-complex with vitamin C  1 tablet Oral Daily  . heparin  5,000 Units Subcutaneous Q8H  . losartan  100 mg Oral Daily  . pneumococcal 23 valent vaccine  0.5 mL Intramuscular Tomorrow-1000  . potassium chloride  40 mEq Oral Once  . sodium chloride  3 mL Intravenous Q12H  . DISCONTD: diltiazem (CARDIZEM) infusion  5 mg/hr Intravenous Once      . sodium chloride 100 mL/hr at 09/25/11 1940  . DISCONTD: diltiazem (CARDIZEM) infusion 5 mg/hr (09/26/11 0342)    Physical Exam: The patient appears to be in no distress.  Head and neck exam reveals that the pupils are equal and reactive.  The extraocular movements are full.  There is no scleral icterus.  Mouth and pharynx are benign.  No lymphadenopathy.  No carotid bruits.  The jugular venous pressure is normal.  Thyroid is not enlarged or tender.  Chest reveals a few inspiratory rhonchi.  She has a nonproductive cough  Heart reveals no abnormal lift or heave.  First and second heart sounds are normal.  There is no murmur gallop rub or click.  The abdomen is soft and nontender.  Bowel  sounds are normoactive.  There is no hepatosplenomegaly or mass.  There are no abdominal bruits.  Extremities reveal no phlebitis or edema.  Pedal pulses are good.  There is no cyanosis or clubbing.  Neurologic exam is normal strength and no lateralizing weakness.  No sensory deficits.  Integument reveals no rash  Lab Results:  Basename 09/27/11 0643 09/26/11 0010  WBC 3.4* 2.3*  HGB 12.2 11.4*  PLT 108* 109*    Basename 09/27/11 0643 09/26/11 0010  NA 140 139  K 3.3* 3.4*  CL 106 106  CO2 25 24  GLUCOSE 77 93  BUN 13 15  CREATININE 0.68 0.82    Basename 09/25/11 2341 09/25/11 1827  TROPONINI <0.30 <0.30   Hepatic Function Panel No results found for this basename: PROT,ALBUMIN,AST,ALT,ALKPHOS,BILITOT,BILIDIR,IBILI in the last 72 hours No results found for this basename: CHOL in the last 72 hours No results found for this basename: PROTIME in the last 72 hours  Imaging: Imaging results have been reviewed.  There is borderline cardiomegaly.  The lungs are clear on x-ray  Cardiac Studies: Two-dimensional echocardiogram shows normal systolic function with an ejection fraction of 60-65% and she has grade 1 diastolic dysfunction.  Assessment/Plan:  Patient Active Hospital Problem List: PSVT (paroxysmal supraventricular tachycardia) (09/26/2011)   Assessment: No further SVT but she did have one run of monomorphic  ventricular tachycardia yesterday    Plan: Repeat potassium.  Add low-dose beta blocker.   Right bundle branch block (09/26/2011)   Assessment: No change    Plan: No change  Upper respiratory tract infection (09/26/2011)   Assessment: She is afebrile.  She recently completed a course of Zithromax    Plan: Administer Mucinex 600 mg twice a day  Essential hypertension, benign (09/26/2011)   Assessment: Systolic blood pressure borderline elevated this morning at 143/76    Plan: Add Toprol 12.5 mg one daily.  Ambulate in the hall today.  Anticipate discharge home  tomorrow if stable.   LOS: 2 days    Cassell Clement 09/27/2011, 8:39 AM

## 2011-09-28 ENCOUNTER — Encounter (HOSPITAL_COMMUNITY): Payer: Self-pay | Admitting: Physician Assistant

## 2011-09-28 LAB — BASIC METABOLIC PANEL
BUN: 13 mg/dL (ref 6–23)
CO2: 27 mEq/L (ref 19–32)
Chloride: 105 mEq/L (ref 96–112)
Creatinine, Ser: 0.74 mg/dL (ref 0.50–1.10)
Glucose, Bld: 88 mg/dL (ref 70–99)

## 2011-09-28 MED ORDER — POTASSIUM CHLORIDE CRYS ER 20 MEQ PO TBCR
20.0000 meq | EXTENDED_RELEASE_TABLET | Freq: Every day | ORAL | Status: DC
  Administered 2011-09-28: 20 meq via ORAL
  Filled 2011-09-28: qty 1

## 2011-09-28 MED ORDER — POTASSIUM CHLORIDE CRYS ER 20 MEQ PO TBCR: 20.0000 meq | EXTENDED_RELEASE_TABLET | Freq: Every day | ORAL | Status: DC

## 2011-09-28 MED ORDER — METOPROLOL SUCCINATE ER 25 MG PO TB24: 12.5000 mg | ORAL_TABLET | Freq: Every day | ORAL | Status: DC

## 2011-09-28 NOTE — Discharge Summary (Signed)
Discharge Summary   Patient ID: CHESLEY VALLS MRN: [id number], DOB/AGE: 11/11/1919 78 y.o. Admit date: 09/25/2011 D/C date:     09/28/2011   Primary Discharge Diagnoses:  1. SVT 2. Recent URI 3. Short run of monomorphic VT in setting of hypokalemia - EF 60-65% by echo 09/26/11 4. Hypokalemia - started on repletion, pending BMET 10/05/11 to determine if KCl needs to be continued 5. RBBB  Secondary Discharge Diagnoses:  1. HTN 2. GERD 3. Hypercholesterolemia  Hospital Course: 78 y/o F with hx of HTN, GERD, HL presented with nausea and fatigue along with recent cough treated as an outpatient with antibiotics. On the basis of continued nausea and poor oral intake, she presented to [facility]25[facility]11 and was found to be profoundly tachycardic with a normal blood pressure. She was given a liter of normal saline and IV Diltiazem as a bolus followed by continuous infusion. After improvement in her heart rate, she was subsequent transferred to [facility] for further evaluation and management. Her initial EKG was reviewed with report of RBBB (QRS~130) with HR on 171; regular without evident P-wave activity though P's may be buried under T waves; no obvious ST segment abnormalities. The admitting physician felt she developed an SVT secondary to nausea and a degree of dehydration for poor PO intake from a viral syndrome this week. After quiescence of SVT, cardizem was changed back to amlodipine. Cardiac enzymes remained negative. 2D echo demonstrated EF 60-65%. She had an episode on 09/27/11 noted of a short run of monomorphic ventricular tachycardia in the setting of hypokalemia felt secondary to GI losses. Potassium was repleted and low-dose BB was added. She ambulated without difficulty. Today she is feeling well and rhythm has remained stable. Dr. Patty Sermons has seen and examined her and feels she is stable for discharge. Of note, her platelets and wbc are slightly decreased and therefore  she was instructed to f/u with PCP for these things. Dr. Patty Sermons has requested she have a BMET and CBC in one week which she was given a lab slip for. Dr. Patty Sermons is not sure she will require long-term potassium supplementation so she will have this checked as part of her BMET.  Discharge Vitals: Blood pressure 153/81, pulse 72, temperature 98.1 F (36.7 C), temperature source Oral, resp. rate 18, height 5\' 3"  (1.6 m), weight 154 lb 12.2 oz (70.2 kg), SpO2 100.00%.  Labs: Lab Results  Component Value Date   WBC 3.4* 09/27/2011   HGB 12.2 09/27/2011   HCT 37.1 09/27/2011   MCV 83.4 09/27/2011   PLT 108* 09/27/2011    Lab 09/28/11 0600  NA 140  K 4.0  CL 105  CO2 27  BUN 13  CREATININE 0.74  CALCIUM 9.2  PROT --  BILITOT --  ALKPHOS --  ALT --  AST --  GLUCOSE 88    Basename 09/25/11 2341 09/25/11 1827 09/25/11 1440  CKTOTAL 133 160 --  CKMB 3.5 4.1* --  TROPONINI <0.30 <0.30 <0.30   Diagnostic Studies/Procedures   1.Chest Portable 1 View 09/25/2011  *RADIOLOGY REPORT*  Clinical Data: Severe nausea and shortness of breath.  PORTABLE CHEST - 1 VIEW 09/25/2011 1455 hours:  Comparison: Two-view chest x-ray 12/17/2010 and portable chest x- ray 10/18/2005 [facility].  Findings: Cardiac silhouette mildly enlarged but stable.  Thoracic aorta atherosclerotic, unchanged.  Hilar and mediastinal contours otherwise unremarkable.  Lungs clear.  Bronchovascular markings normal.  Pulmonary vascularity normal.  No pneumothorax.  No pleural effusions.  Surgical clips in the left axilla from prior node dissection.  IMPRESSION: Stable mild cardiomegaly.  No acute cardiopulmonary disease.  Original Report Authenticated By: Arnell Sieving, M.D.   2. 2D Echo 09/26/11 Study Conclusions - Left ventricle: Systolic function was normal. The estimated ejection fraction was in the range of 60% to 65%. There was an increased relative contribution of atrial contraction to ventricular filling. -  Aortic valve: Mild regurgitation. - Mitral valve: Mild regurgitation. - Pulmonary arteries: PA peak pressure: 34mm Hg (S).  Discharge Medications   Medication List  As of 09/28/2011  9:36 AM   STOP taking these medications         azithromycin 250 MG tablet         TAKE these medications         amLODipine 5 MG tablet   Commonly known as: NORVASC   Take 5 mg by mouth every morning.      aspirin 81 MG tablet   Take 81 mg by mouth daily.      B-complex with vitamin C tablet   Take 1 tablet by mouth daily.      cetirizine 10 MG tablet   Commonly known as: ZYRTEC   Take 10 mg by mouth daily as needed. For allergies      fish oil-omega-3 fatty acids 1000 MG capsule   Take 1 g by mouth daily.      fluticasone 50 MCG/ACT nasal spray   Commonly known as: FLONASE   Place 2 sprays into the nose daily.      HYDROcodone-homatropine 5-1.5 MG/5ML syrup   Commonly known as: HYCODAN   Take 5-10 mLs by mouth every 6 (six) hours as needed. For cough        lansoprazole 30 MG capsule   Commonly known as: PREVACID   Take 30 mg by mouth daily after breakfast.      losartan 100 MG tablet   Commonly known as: COZAAR   Take 100 mg by mouth every morning.      metoprolol succinate 25 MG 24 hr tablet   Commonly known as: TOPROL-XL   Take 0.5 tablets (12.5 mg total) by mouth daily.      potassium chloride SA 20 MEQ tablet   Commonly known as: K-DUR,KLOR-CON   Take 1 tablet (20 mEq total) by mouth daily.      rosuvastatin 5 MG tablet   Commonly known as: CRESTOR   Take 5 mg by mouth every morning.            Disposition   The patient will be discharged in stable condition to home. Discharge Orders    Future Appointments: Provider: Department: Dept Phone: Center:   10/08/2011 1:40 PM Joni Reining, NP [facility] [phone] [id number]     Future Orders Please Complete By Expires   Diet - low sodium heart healthy      Increase activity slowly        Follow-up  Information    Follow up with Josue Hector, MD. (Please follow up to monitor your other medical issues. Your platelet count and white blood cell count were slightly low and will be rechecked  next week, but your primary doctor may need to evaluate this)       Follow up with [facility]. (You will see Joni Reining, NP on 10/08/11 at 1:40pm)    Contact information:   [address] [place] [address] [phone]      Follow up  with [facility] in [place]. (Please take lab slip to lab on Monday 10/05/11 to check electrolytes and blood count)    Contact information:   If you need help with directions, call the [place] Office at [phone]           Duration of Discharge Encounter: 35 minutes including physician and PA time.  Signed, Ronie Spies PA-C 09/28/2011, 9:36 AM

## 2011-09-28 NOTE — Progress Notes (Signed)
Pt/family given discharge instructions, medication list, follow up appointments and when to call the doctor.  Pt/family verbalized understanding of instructions. Thomas Hoff

## 2011-09-28 NOTE — Progress Notes (Signed)
Subjective:  Patient walked well in hall yesterday.  Rhythm stable NSR on low dose Toprol. BP improving. Afebrile. Cough has improved.  Objective:  Vital Signs in the last 24 hours: Temp:  [98.1 F (36.7 C)-98.6 F (37 C)] 98.1 F (36.7 C) (02/26 0500) Pulse Rate:  [72-78] 72  (02/26 0500) Resp:  [18] 18  (02/26 0500) BP: (100-173)/(66-90) 153/81 mmHg (02/26 0500) SpO2:  [98 %-100 %] 100 % (02/26 0500)  Intake/Output from previous day: 02/25 0701 - 02/26 0700 In: 360 [P.O.:360] Out: 650 [Urine:650] Intake/Output from this shift:       . sodium chloride   Intravenous Once  . amLODipine  5 mg Oral Daily  . aspirin EC  81 mg Oral Daily  . atorvastatin  10 mg Oral q1800  . B-complex with vitamin C  1 tablet Oral Daily  . guaiFENesin  600 mg Oral BID  . heparin  5,000 Units Subcutaneous Q8H  . losartan  100 mg Oral Daily  . metoprolol succinate  12.5 mg Oral Daily  . pneumococcal 23 valent vaccine  0.5 mL Intramuscular Tomorrow-1000  . potassium chloride  20 mEq Oral BID  . sodium chloride  3 mL Intravenous Q12H      . sodium chloride 100 mL/hr at 09/25/11 1940    Physical Exam: The patient appears to be in no distress.  Head and neck exam reveals that the pupils are equal and reactive.  The extraocular movements are full.  There is no scleral icterus.  Mouth and pharynx are benign.  No lymphadenopathy.  No carotid bruits.  The jugular venous pressure is normal.  Thyroid is not enlarged or tender.  Chest reveals a few inspiratory rhonchi.  She has a nonproductive cough  Heart reveals no abnormal lift or heave.  First and second heart sounds are normal.  There is no murmur gallop rub or click.  The abdomen is soft and nontender.  Bowel sounds are normoactive.  There is no hepatosplenomegaly or mass.  There are no abdominal bruits.  Extremities reveal no phlebitis or edema.  Pedal pulses are good.  There is no cyanosis or clubbing.  Neurologic exam is normal  strength and no lateralizing weakness.  No sensory deficits.  Integument reveals no rash  Lab Results:  Basename 09/27/11 0643 09/26/11 0010  WBC 3.4* 2.3*  HGB 12.2 11.4*  PLT 108* 109*    Basename 09/28/11 0600 09/27/11 0643  NA 140 140  K 4.0 3.3*  CL 105 106  CO2 27 25  GLUCOSE 88 77  BUN 13 13  CREATININE 0.74 0.68    Basename 09/25/11 2341 09/25/11 1827  TROPONINI <0.30 <0.30   Hepatic Function Panel No results found for this basename: PROT,ALBUMIN,AST,ALT,ALKPHOS,BILITOT,BILIDIR,IBILI in the last 72 hours No results found for this basename: CHOL in the last 72 hours No results found for this basename: PROTIME in the last 72 hours  Imaging: Imaging results have been reviewed.  There is borderline cardiomegaly.  The lungs are clear on x-ray  Cardiac Studies: Two-dimensional echocardiogram shows normal systolic function with an ejection fraction of 60-65% and she has grade 1 diastolic dysfunction.  Assessment/Plan:  Patient Active Hospital Problem List: PSVT (paroxysmal supraventricular tachycardia) (09/26/2011)   Assessment: No further SVT    Plan: Continue current Rx Right bundle branch block (09/26/2011)   Assessment: No change    Plan: No change  Upper respiratory tract infection (09/26/2011)   Assessment: She is afebrile.  She recently completed a course  of Zithromax    Plan: Improved on Mucinex 600 mg twice a day.  Follow CBC as outpatient re low platelets and leukopenia felt to be secondary to recent viral illness.  Essential hypertension, benign (09/26/2011)   Assessment: Systolic blood pressure still mildly elevated.   Plan: Continue Toprol, Losartan,amlodipine. Follow as outpatient and consider further increase in amlodipine if necessary.   Plan: Home today on current Rx and medical follow-up with Dr. Lysbeth Galas and cardiology follow-up in [place]. Potassium level now back to normal after GI losses so will probably not require longterm potassium.  Will  reduce Kdur to once a day and she should have followup CBC and BMET in 1 week.   LOS: 3 days    Cassell Clement 09/28/2011, 7:30 AM

## 2011-10-01 NOTE — H&P (Signed)
This note was prepared in advance by Dr. Anselm Jungling, although the patient was actually seen by Dr. Maryelizabeth Kaufmann, and it is his documentation that should serve as the H&P.

## 2011-10-05 ENCOUNTER — Other Ambulatory Visit: Payer: Self-pay | Admitting: Physician Assistant

## 2011-10-05 LAB — BASIC METABOLIC PANEL
BUN: 13 mg/dL (ref 6–23)
Creat: 0.72 mg/dL (ref 0.50–1.10)

## 2011-10-06 LAB — CBC
HCT: 37.7 % (ref 36.0–46.0)
MCH: 27.6 pg (ref 26.0–34.0)
MCHC: 32.1 g/dL (ref 30.0–36.0)
MCV: 86.1 fL (ref 78.0–100.0)
RDW: 13.7 % (ref 11.5–15.5)

## 2011-10-08 ENCOUNTER — Ambulatory Visit: Payer: Medicare Other | Admitting: Adult Health

## 2011-10-08 ENCOUNTER — Encounter: Payer: Self-pay | Admitting: *Deleted

## 2011-10-11 ENCOUNTER — Encounter: Payer: Self-pay | Admitting: Adult Health

## 2011-10-11 ENCOUNTER — Ambulatory Visit (INDEPENDENT_AMBULATORY_CARE_PROVIDER_SITE_OTHER): Payer: Medicare Other | Admitting: Adult Health

## 2011-10-11 DIAGNOSIS — I471 Supraventricular tachycardia: Secondary | ICD-10-CM

## 2011-10-11 DIAGNOSIS — I10 Essential (primary) hypertension: Secondary | ICD-10-CM

## 2011-10-11 NOTE — Progress Notes (Signed)
HPI: [name] is an 78 y/o patient of Dr. Dietrich Pates we are seeing post-hospitalization at [facility], where she was admitted for SVT, URI, hypokalemia, with know history of hypertension, GERD, and hypercholesterolemia. She was found to be dehydrated, and given IVF hydration. She developed SVT and started on cardizem gtt. SVT resolved and therefore cardizem was discontinued. She was placed back on amlodipine. She was given potassium repletion with discharge potassium of 4.0.  Echocardiogram demonstrated normal EF of 60-65%.  Since discharge she has felt much better and has been asymptomatic. She is drinking Ensure and drinking Gatoraid daily. She is mildly hypertensive on this visit. Otherwise she is without complaint.  No Known Allergies  Current Outpatient Prescriptions  Medication Sig Dispense Refill  . amLODipine (NORVASC) 5 MG tablet Take 5 mg by mouth every morning.        Marland Kitchen aspirin 81 MG tablet Take 81 mg by mouth daily.      . B Complex-C (B-COMPLEX WITH VITAMIN C) tablet Take 1 tablet by mouth daily.      . cetirizine (ZYRTEC) 10 MG tablet Take 10 mg by mouth daily as needed. For allergies      . Cholecalciferol (VITAMIN D-3 PO) Take by mouth daily.      . fish oil-omega-3 fatty acids 1000 MG capsule Take 1 g by mouth daily.      . fluticasone (FLONASE) 50 MCG/ACT nasal spray Place 2 sprays into the nose daily.        Marland Kitchen HYDROcodone-homatropine (HYCODAN) 5-1.5 MG/5ML syrup Take 5-10 mLs by mouth every 6 (six) hours as needed. For cough       . lansoprazole (PREVACID) 30 MG capsule Take 30 mg by mouth daily after breakfast.        . losartan (COZAAR) 100 MG tablet Take 100 mg by mouth every morning.        . metoprolol succinate (TOPROL-XL) 25 MG 24 hr tablet Take 0.5 tablets (12.5 mg total) by mouth daily.  30 tablet  6  . potassium chloride SA (K-DUR,KLOR-CON) 20 MEQ tablet Take 1 tablet (20 mEq total) by mouth daily.  30 tablet  6  . rosuvastatin (CRESTOR) 5 MG tablet Take 5 mg  by mouth every morning.          Past Medical History  Diagnosis Date  . Hypertension   . GERD (gastroesophageal reflux disease)   . High cholesterol   . SVT (supraventricular tachycardia)     Noted 09/2011 responsive to cardizem  . NSVT (nonsustained ventricular tachycardia)     Short run of monomorphic VT in setting of hypokalemia during 09/2011 hospitalization. EF 60-65% by echo 09/26/11  . Right bundle branch block     Past Surgical History  Procedure Date  . Cholecystectomy   . Breast lumpectomy     left breast    QIO:NGEXBM of systems complete and found to be negative unless listed above PHYSICAL EXAM BP 172/77  Pulse 80  Resp 18  Ht 5\' 3"  (1.6 m)  Wt 149 lb (67.586 kg)  BMI 26.39 kg/m2  General: Well developed, well nourished, in no acute distress Head: Eyes PERRLA, No xanthomas.   Normal cephalic and atramatic  Lungs: Clear bilaterally to auscultation and percussion. Heart: HRRR S1 S2, without MRG.  Pulses are 2+ & equal.            No carotid bruit. No JVD.  No abdominal bruits. No femoral bruits. Abdomen: Bowel sounds are positive, abdomen soft  and non-tender without masses or                  Hernia's noted. Msk:  Back normal, normal gait. Normal strength and tone for age. Extremities: No clubbing, cyanosis or edema.  DP +1 Neuro: Alert and oriented X 3. Psych:  Good affect, responds appropriately  ASSESSMENT AND PLAN

## 2011-10-11 NOTE — Assessment & Plan Note (Signed)
There was no evidence of LVH on echo during admission. She is hypertensive on this evaluation, but I rechecked it in the office and found to be 148/78.  I will not make any changes in her medications at this time. I will check a BMET to evaluate kidney fx and potassium status. She is asked to stop drinking Gator Aid secondary to potassium and salt in the drink. Will follow-up in one month for further assessment.

## 2011-10-11 NOTE — Patient Instructions (Signed)
Your physician recommends that you schedule a follow-up appointment in: 3 months  Your physician recommends that you return for lab work in: Today   

## 2011-10-11 NOTE — Assessment & Plan Note (Signed)
This has been quiescent and believed to be related to dehydration and hypokalemia.  No further testing or medication changes are necessary at this time.

## 2011-10-12 ENCOUNTER — Encounter: Payer: Self-pay | Admitting: *Deleted

## 2011-10-12 LAB — BASIC METABOLIC PANEL
Potassium: 4.1 mEq/L (ref 3.5–5.3)
Sodium: 137 mEq/L (ref 135–145)

## 2012-04-20 ENCOUNTER — Other Ambulatory Visit: Payer: Self-pay | Admitting: *Deleted

## 2012-04-20 MED ORDER — POTASSIUM CHLORIDE CRYS ER 20 MEQ PO TBCR: 20.0000 meq | EXTENDED_RELEASE_TABLET | Freq: Every day | ORAL | Status: DC

## 2012-05-17 ENCOUNTER — Other Ambulatory Visit: Payer: Self-pay | Admitting: Adult Health

## 2012-05-22 ENCOUNTER — Ambulatory Visit (INDEPENDENT_AMBULATORY_CARE_PROVIDER_SITE_OTHER): Payer: Medicare Other | Admitting: Physician Assistant

## 2012-05-22 ENCOUNTER — Encounter: Payer: Self-pay | Admitting: Physician Assistant

## 2012-05-22 VITALS — BP 150/78 | HR 77 | Ht 65.0 in | Wt 161.0 lb

## 2012-05-22 DIAGNOSIS — I10 Essential (primary) hypertension: Secondary | ICD-10-CM

## 2012-05-22 DIAGNOSIS — I471 Supraventricular tachycardia: Secondary | ICD-10-CM

## 2012-05-22 NOTE — Patient Instructions (Addendum)
Have Blood Pressure checked with PCP   Your physician wants you to follow-up in: 6 months with Dr.Wall You will receive a reminder letter in the mail two months in advance. If you don't receive a letter, please call our office to schedule the follow-up appointment.    Follow 2 gm sodium diet

## 2012-05-22 NOTE — Assessment & Plan Note (Signed)
Patient's blood pressure is elevated today but she did he does smoke sausage for breakfast this morning. She states it is always normal and she sees her primary care physician. She is willing to keep track of her blood pressure and call us if it is elevated. Recommend 2 g sodium diet.

## 2012-05-22 NOTE — Progress Notes (Signed)
HPI:  This is a 78 year old African American female patient Dr. Elijah Birk wall was here for  Followup. She was last seen in March at which time she was doing well. She has a history of SVT in the setting of an upper respiratory infection. She also has history of hypertension.  The patient says she's been doing extremely well and denies any cardiac complaints. She has no history of chest pain, palpitations, dyspnea, dyspnea on exertion, dizziness, or presyncope. Her blood pressure is elevated today but she admits she is a smoked sausage for breakfast. She tries to do well most of the time. Her echo in the past showed an ejection fraction of 60-65%.   No Known Allergies  Current Outpatient Prescriptions on File Prior to Visit: amLODipine (NORVASC) 5 MG tablet, Take 5 mg by mouth every morning.  , Disp: , Rfl:  aspirin 81 MG tablet, Take 81 mg by mouth daily., Disp: , Rfl:  cetirizine (ZYRTEC) 10 MG tablet, Take 10 mg by mouth daily as needed. For allergies, Disp: , Rfl:  fluticasone (FLONASE) 50 MCG/ACT nasal spray, Place 2 sprays into the nose daily.  , Disp: , Rfl:  KLOR-CON M20 20 MEQ tablet, TAKE ONE TABLET BY MOUTH ONE TIME DAILY, Disp: 30 tablet, Rfl: 11 lansoprazole (PREVACID) 30 MG capsule, Take 30 mg by mouth daily after breakfast.  , Disp: , Rfl:  losartan (COZAAR) 100 MG tablet, Take 100 mg by mouth every morning.  , Disp: , Rfl:  metoprolol succinate (TOPROL-XL) 25 MG 24 hr tablet, Take 0.5 tablets (12.5 mg total) by mouth daily., Disp: 30 tablet, Rfl: 6    Past Medical History:   Hypertension                                                 GERD (gastroesophageal reflux disease)                       High cholesterol                                             SVT (supraventricular tachycardia)                             Comment:Noted 09/2011 responsive to cardizem   NSVT (nonsustained ventricular tachycardia)                    Comment:Short run of monomorphic VT in setting of          hypokalemia during 09/2011 hospitalization. EF               60-65% by echo 09/26/11   Right bundle branch block                                   Past Surgical History:   CHOLECYSTECTOMY                                              BREAST LUMPECTOMY  Comment:left breast  No family history on file.   Social History   Marital Status: Widowed             Spouse Name:                      Years of Education:                 Number of children:             Occupational History   None on file  Social History Main Topics   Smoking Status: Never Smoker                     Smokeless Status: Not on file                      Alcohol Use: No             Drug Use: No             Sexual Activity:                    Other Topics            Concern   None on file  Social History Narrative   None on file    ROS:see history of present illness otherwise negative   PHYSICAL EXAM: Well-nournished, in no acute distress. Neck: No JVD, HJR, Bruit, or thyroid enlargement  Lungs: No tachypnea, clear without wheezing, rales, or rhonchi  Cardiovascular: RRR, PMI not displaced, heart sounds normal, no murmurs, gallops, bruit, thrill, or heave.  Abdomen: BS normal. Soft without organomegaly, masses, lesions or tenderness.  Extremities: without cyanosis, clubbing or edema. Good distal pulses bilateral  SKin: Warm, no lesions or rashes   Musculoskeletal: No deformities  Neuro: no focal signs  BP 150/78  Pulse 77  Ht 5\' 5"  (1.651 m)  Wt 161 lb (73.029 kg)  BMI 26.79 kg/m2   UJW:[id number] sinus rhythm with right bundle branch block

## 2012-05-22 NOTE — Assessment & Plan Note (Signed)
No recurrence. 

## 2012-05-23 ENCOUNTER — Telehealth: Payer: Self-pay | Admitting: Physician Assistant

## 2012-05-23 NOTE — Telephone Encounter (Signed)
Pt is calling about BP 132/82 this morning

## 2012-05-23 NOTE — Telephone Encounter (Signed)
Noted.  Advised patient that this was an adequate blood pressure and to continue with current medication regimen.

## 2012-10-25 ENCOUNTER — Encounter: Payer: Self-pay | Admitting: Cardiology

## 2012-10-25 ENCOUNTER — Ambulatory Visit (INDEPENDENT_AMBULATORY_CARE_PROVIDER_SITE_OTHER): Payer: Medicare Other | Admitting: Cardiology

## 2012-10-25 VITALS — BP 140/70 | HR 83 | Ht 63.0 in | Wt 157.1 lb

## 2012-10-25 DIAGNOSIS — I471 Supraventricular tachycardia: Secondary | ICD-10-CM

## 2012-10-25 NOTE — Progress Notes (Signed)
HPI [name] returns today for evaluation and management of her history of SVT and nonsustained ventricular tachycardia. Her last echocardiogram in February 2013 showed normal left ventricular systolic function.  She still very independent. She drives locally and still gets out to church and other activities.  She's had no symptoms of any cardiac decompensation or SVT. She is on low-dose metoprolol.  Past Medical History  Diagnosis Date  . Hypertension   . GERD (gastroesophageal reflux disease)   . High cholesterol   . SVT (supraventricular tachycardia)     Noted 09/2011 responsive to cardizem  . NSVT (nonsustained ventricular tachycardia)     Short run of monomorphic VT in setting of hypokalemia during 09/2011 hospitalization. EF 60-65% by echo 09/26/11  . Right bundle branch block     Current Outpatient Prescriptions  Medication Sig Dispense Refill  . amLODipine (NORVASC) 5 MG tablet Take 5 mg by mouth every morning.        Marland Kitchen aspirin 81 MG tablet Take 81 mg by mouth daily.      . cetirizine (ZYRTEC) 10 MG tablet Take 10 mg by mouth daily as needed. For allergies      . fluticasone (FLONASE) 50 MCG/ACT nasal spray Place 2 sprays into the nose daily.        Marland Kitchen KLOR-CON M20 20 MEQ tablet TAKE ONE TABLET BY MOUTH ONE TIME DAILY  30 tablet  11  . lansoprazole (PREVACID) 30 MG capsule Take 30 mg by mouth daily after breakfast.        . metoprolol succinate (TOPROL-XL) 25 MG 24 hr tablet Take 0.5 tablets (12.5 mg total) by mouth daily.  30 tablet  6   No current facility-administered medications for this visit.    No Known Allergies  No family history on file.  History   Social History  . Marital Status: Widowed    Spouse Name: N/A    Number of Children: N/A  . Years of Education: N/A   Occupational History  . Not on file.   Social History Main Topics  . Smoking status: Never Smoker   . Smokeless tobacco: Not on file  . Alcohol Use: No  . Drug Use: No  . Sexually Active:     Other Topics Concern  . Not on file   Social History Narrative  . No narrative on file    ROS ALL NEGATIVE EXCEPT THOSE NOTED IN HPI  PE  General Appearance: well developed, well nourished in no acute distress, looks younger than stated age HEENT: symmetrical face, PERRLA, good dentition  Neck: no JVD, thyromegaly, or adenopathy, trachea midline Chest: symmetric without deformity Cardiac: PMI non-displaced, RRR, normal S1, S2, no gallop or murmur Lung: clear to ausculation and percussion Vascular: all pulses full without bruits  Abdominal: nondistended, nontender, good bowel sounds, no HSM, no bruits Extremities: no cyanosis, clubbing or edema, no sign of DVT, no varicosities  Skin: normal color, no rashes Neuro: alert and oriented x 3, non-focal Pysch: normal affect  EKG Not repeated  BMET    Component Value Date/Time   NA 137 10/11/2011 1455   K 4.1 10/11/2011 1455   CL 103 10/11/2011 1455   CO2 26 10/11/2011 1455   GLUCOSE 83 10/11/2011 1455   BUN 13 10/11/2011 1455   CREATININE 0.53 10/11/2011 1455   CREATININE 0.74 09/28/2011 0600   CALCIUM 9.9 10/11/2011 1455   GFRNONAA 73* 09/28/2011 0600   GFRAA 85* 09/28/2011 0600    Lipid Panel  No  results found for this basename: chol, trig, hdl, cholhdl, vldl, ldlcalc    CBC    Component Value Date/Time   WBC 4.5 10/05/2011 1310   RBC 4.38 10/05/2011 1310   HGB 12.1 10/05/2011 1310   HCT 37.7 10/05/2011 1310   PLT 199 10/05/2011 1310   MCV 86.1 10/05/2011 1310   MCH 27.6 10/05/2011 1310   MCHC 32.1 10/05/2011 1310   RDW 13.7 10/05/2011 1310   LYMPHSABS 0.9 09/25/2011 1440   MONOABS 0.2 09/25/2011 1440   EOSABS 0.0 09/25/2011 1440   BASOSABS 0.0 09/25/2011 1440

## 2012-10-25 NOTE — Patient Instructions (Addendum)
Your physician recommends that you schedule a follow-up appointment in: one year  

## 2012-10-25 NOTE — Assessment & Plan Note (Signed)
No clinical recurrence. Continue low-dose beta blocker. Return when necessary or in one year.

## 2012-11-06 ENCOUNTER — Other Ambulatory Visit: Payer: Self-pay | Admitting: Physician Assistant

## 2013-01-20 ENCOUNTER — Encounter (HOSPITAL_COMMUNITY): Payer: Self-pay | Admitting: *Deleted

## 2013-01-20 ENCOUNTER — Emergency Department (HOSPITAL_COMMUNITY)
Admission: EM | Admit: 2013-01-20 | Discharge: 2013-01-20 | Disposition: A | Discharge status: Home / Self Care | Payer: Medicare Other | Attending: Emergency Medicine | Admitting: Emergency Medicine

## 2013-01-20 ENCOUNTER — Emergency Department (HOSPITAL_COMMUNITY): Payer: Medicare Other

## 2013-01-20 DIAGNOSIS — I4729 Other ventricular tachycardia: Secondary | ICD-10-CM | POA: Insufficient documentation

## 2013-01-20 DIAGNOSIS — Z7982 Long term (current) use of aspirin: Secondary | ICD-10-CM | POA: Insufficient documentation

## 2013-01-20 DIAGNOSIS — Z79899 Other long term (current) drug therapy: Secondary | ICD-10-CM | POA: Insufficient documentation

## 2013-01-20 DIAGNOSIS — Z8639 Personal history of other endocrine, nutritional and metabolic disease: Secondary | ICD-10-CM | POA: Insufficient documentation

## 2013-01-20 DIAGNOSIS — I472 Ventricular tachycardia, unspecified: Secondary | ICD-10-CM | POA: Insufficient documentation

## 2013-01-20 DIAGNOSIS — I10 Essential (primary) hypertension: Secondary | ICD-10-CM | POA: Insufficient documentation

## 2013-01-20 DIAGNOSIS — Z862 Personal history of diseases of the blood and blood-forming organs and certain disorders involving the immune mechanism: Secondary | ICD-10-CM | POA: Insufficient documentation

## 2013-01-20 DIAGNOSIS — Z8679 Personal history of other diseases of the circulatory system: Secondary | ICD-10-CM | POA: Insufficient documentation

## 2013-01-20 DIAGNOSIS — R110 Nausea: Secondary | ICD-10-CM | POA: Insufficient documentation

## 2013-01-20 DIAGNOSIS — R142 Eructation: Secondary | ICD-10-CM | POA: Insufficient documentation

## 2013-01-20 DIAGNOSIS — R141 Gas pain: Secondary | ICD-10-CM | POA: Insufficient documentation

## 2013-01-20 DIAGNOSIS — R109 Unspecified abdominal pain: Secondary | ICD-10-CM | POA: Insufficient documentation

## 2013-01-20 DIAGNOSIS — K219 Gastro-esophageal reflux disease without esophagitis: Secondary | ICD-10-CM | POA: Insufficient documentation

## 2013-01-20 DIAGNOSIS — Z9089 Acquired absence of other organs: Secondary | ICD-10-CM | POA: Insufficient documentation

## 2013-01-20 DIAGNOSIS — R1909 Other intra-abdominal and pelvic swelling, mass and lump: Secondary | ICD-10-CM | POA: Insufficient documentation

## 2013-01-20 DIAGNOSIS — IMO0002 Reserved for concepts with insufficient information to code with codable children: Secondary | ICD-10-CM | POA: Insufficient documentation

## 2013-01-20 LAB — CBC WITH DIFFERENTIAL/PLATELET
Basophils Absolute: 0 10*3/uL (ref 0.0–0.1)
Basophils Relative: 0 % (ref 0–1)
Eosinophils Absolute: 0 10*3/uL (ref 0.0–0.7)
HCT: 36.9 % (ref 36.0–46.0)
MCH: 28.1 pg (ref 26.0–34.0)
MCHC: 32.5 g/dL (ref 30.0–36.0)
Monocytes Absolute: 0.3 10*3/uL (ref 0.1–1.0)
Neutro Abs: 3 10*3/uL (ref 1.7–7.7)
RDW: 13.6 % (ref 11.5–15.5)

## 2013-01-20 LAB — URINALYSIS, ROUTINE W REFLEX MICROSCOPIC
Bilirubin Urine: NEGATIVE
Glucose, UA: NEGATIVE mg/dL
Hgb urine dipstick: NEGATIVE
Ketones, ur: NEGATIVE mg/dL
Protein, ur: NEGATIVE mg/dL
Urobilinogen, UA: 0.2 mg/dL (ref 0.0–1.0)

## 2013-01-20 LAB — COMPREHENSIVE METABOLIC PANEL
ALT: 12 U/L (ref 0–35)
AST: 18 U/L (ref 0–37)
Alkaline Phosphatase: 72 U/L (ref 39–117)
CO2: 27 mEq/L (ref 19–32)
GFR calc Af Amer: 60 mL/min — ABNORMAL LOW (ref >90)
GFR calc non Af Amer: 52 mL/min — ABNORMAL LOW (ref >90)
Glucose, Bld: 140 mg/dL — ABNORMAL HIGH (ref 70–99)
Potassium: 4.1 mEq/L (ref 3.5–5.1)
Sodium: 137 mEq/L (ref 135–145)
Total Protein: 8 g/dL (ref 6.0–8.3)

## 2013-01-20 LAB — URINE MICROSCOPIC-ADD ON

## 2013-01-20 MED ORDER — PROMETHAZINE HCL 25 MG PO TABS: 25.0000 mg | ORAL_TABLET | Freq: Four times a day (QID) | ORAL | Status: DC | PRN

## 2013-01-20 MED ORDER — FENTANYL CITRATE 0.05 MG/ML IJ SOLN
50.0000 ug | Freq: Once | INTRAMUSCULAR | Status: AC
  Administered 2013-01-20: 50 ug via INTRAVENOUS
  Filled 2013-01-20: qty 2

## 2013-01-20 MED ORDER — SODIUM CHLORIDE 0.9 % IV BOLUS (SEPSIS)
500.0000 mL | Freq: Once | INTRAVENOUS | Status: AC
  Administered 2013-01-20: 1000 mL via INTRAVENOUS

## 2013-01-20 MED ORDER — HYDROCODONE-ACETAMINOPHEN 5-325 MG PO TABS: 1.0000 | ORAL_TABLET | ORAL | Status: DC | PRN

## 2013-01-20 NOTE — ED Provider Notes (Signed)
History  This chart was scribed for Donnetta Hutching, MD, by Yevette Edwards, ED Scribe. This patient was seen in room APA01/APA01 and the patient's care was started at 4:43 PM.   CSN: [id number]  Arrival date & time 01/20/13  1619   First MD Initiated Contact with Patient 01/20/13 1628      Chief Complaint  Patient presents with  . Abdominal Pain    (Consider location/radiation/quality/duration/timing/severity/associated sxs/prior treatment) The history is provided by the patient. No language interpreter was used.   HPI Comments: [name] is a 79 y.o. female who presents to the Emergency Department complaining of  constant nausea which began two days ago. She reports that the nausea causes her to not want to eat, though she did eat oatmeal and toast today. She states that her abdomen is more swollen than it usually is. She reports that she has experienced two bowel movements today, but that her movements were smaller than usual. The pt denies any emesis, diarrhea, and dysuria.   She denies a h/o abdominal surgery. She does have a h/o HTN. The pt denies smoking and drinking.    Past Medical History  Diagnosis Date  . Hypertension   . GERD (gastroesophageal reflux disease)   . High cholesterol   . SVT (supraventricular tachycardia)     Noted 09/2011 responsive to cardizem  . NSVT (nonsustained ventricular tachycardia)     Short run of monomorphic VT in setting of hypokalemia during 09/2011 hospitalization. EF 60-65% by echo 09/26/11  . Right bundle branch block     Past Surgical History  Procedure Laterality Date  . Cholecystectomy    . Breast lumpectomy      left breast    No family history on file.  History  Substance Use Topics  . Smoking status: Never Smoker   . Smokeless tobacco: Not on file  . Alcohol Use: No    No OB history provided.   Review of Systems A complete 10 system review of systems was obtained, and all systems were negative except where indicated  in the HPI and ROS.   Allergies  Review of patient's allergies indicates no known allergies.  Home Medications   Current Outpatient Rx  Name  Route  Sig  Dispense  Refill  . amLODipine (NORVASC) 5 MG tablet   Oral   Take 5 mg by mouth every morning.           Marland Kitchen aspirin EC 81 MG tablet   Oral   Take 81 mg by mouth daily.         . fluticasone (FLONASE) 50 MCG/ACT nasal spray   Nasal   Place 2 sprays into the nose daily.           . lansoprazole (PREVACID) 30 MG capsule   Oral   Take 30 mg by mouth daily after breakfast.           . losartan (COZAAR) 100 MG tablet   Oral   Take 100 mg by mouth daily.         . metoprolol succinate (TOPROL-XL) 25 MG 24 hr tablet   Oral   Take 12.5 mg by mouth daily.         . potassium chloride SA (K-DUR,KLOR-CON) 20 MEQ tablet   Oral   Take 20 mEq by mouth daily.           Triage Vitals: BP 127/110  Pulse 96  Temp(Src) 98 F (36.7 C) (Oral)  Resp  18  Wt 150 lb (68.04 kg)  BMI 26.58 kg/m2  SpO2 100%  Physical Exam  Nursing note and vitals reviewed. Constitutional: She is oriented to person, place, and time. She appears well-developed and well-nourished.  HENT:  Head: Normocephalic and atraumatic.  Eyes: Conjunctivae and EOM are normal. Pupils are equal, round, and reactive to light.  Neck: Normal range of motion. Neck supple.  Cardiovascular: Normal rate, regular rhythm and normal heart sounds.   Pulmonary/Chest: Effort normal and breath sounds normal.  Abdominal: Soft. Bowel sounds are normal. She exhibits distension. There is no tenderness.  Musculoskeletal: Normal range of motion.  Neurological: She is alert and oriented to person, place, and time.  Skin: Skin is warm and dry.  Psychiatric: She has a normal mood and affect.    ED Course  Procedures (including critical care time)   COORDINATION OF CARE:  4:48 PM- Discussed treatment plan with pt which includes IV fluids, blood work, a urine analysis,  and an abdominal x-ray. Pt agreed.   DIAGNOSTIC STUDIES: Oxygen Saturation is 100% on room air, normal by my interpretation.     Labs Reviewed  COMPREHENSIVE METABOLIC PANEL - Abnormal; Notable for the following:    Glucose, Bld 140 (*)    GFR calc non Af Amer 52 (*)    GFR calc Af Amer 60 (*)    All other components within normal limits  CBC WITH DIFFERENTIAL  LIPASE, BLOOD  URINALYSIS, ROUTINE W REFLEX MICROSCOPIC   Dg Abd Acute W/chest  01/20/2013   *RADIOLOGY REPORT*  Clinical Data: Abdominal distention.  Small bowel obstruction.  ACUTE ABDOMEN SERIES (ABDOMEN 2 VIEW & CHEST 1 VIEW)  Comparison: 09/25/2011  Findings: Normal heart size.  No pleural effusion or edema identified.  There is no airspace consolidation identified. Surgical clips noted within the left axilla.  Cholecystectomy clips are present the right upper quadrant the abdomen.  No dilated loop of small bowel or fluid levels identified.  Gas and stool noted within the colon up to the rectum.  IMPRESSION:  1.  No acute cardiopulmonary abnormalities. 2.  Nonobstructive bowel gas pattern noted.   Original Report Authenticated By: Signa Kell, M.D.     No diagnosis found.    MDM  No acute abdomen. Screening tests normal. Patient is slightly constipated. Constipation guidelines given. Acute abdominal series shows no obstruction      I personally performed the services described in this documentation, which was scribed in my presence. The recorded information has been reviewed and is accurate.    Donnetta Hutching, MD 01/20/13 [phone]

## 2013-01-20 NOTE — ED Notes (Signed)
Generalized abd pain and nausea x 2 days.  Denies v/d.

## 2013-01-20 NOTE — ED Notes (Signed)
Pt c/o generalized abd pain with nausea that started two days ago, worse today, denies any diarrhea, vomiting, fever, chills.

## 2013-01-20 NOTE — ED Notes (Signed)
Pt ambulatory to restroom, tolerated well, urine sample obtained, sent to lab, pt and family updated on plan of care

## 2013-03-23 ENCOUNTER — Encounter (HOSPITAL_COMMUNITY): Payer: Self-pay

## 2013-03-23 ENCOUNTER — Emergency Department (HOSPITAL_COMMUNITY)
Admission: EM | Admit: 2013-03-23 | Discharge: 2013-03-23 | Disposition: A | Discharge status: Home / Self Care | Payer: Medicare Other | Attending: Emergency Medicine | Admitting: Emergency Medicine

## 2013-03-23 ENCOUNTER — Emergency Department (HOSPITAL_COMMUNITY): Payer: Medicare Other

## 2013-03-23 DIAGNOSIS — Z8639 Personal history of other endocrine, nutritional and metabolic disease: Secondary | ICD-10-CM | POA: Insufficient documentation

## 2013-03-23 DIAGNOSIS — K219 Gastro-esophageal reflux disease without esophagitis: Secondary | ICD-10-CM | POA: Insufficient documentation

## 2013-03-23 DIAGNOSIS — I498 Other specified cardiac arrhythmias: Secondary | ICD-10-CM | POA: Insufficient documentation

## 2013-03-23 DIAGNOSIS — Z862 Personal history of diseases of the blood and blood-forming organs and certain disorders involving the immune mechanism: Secondary | ICD-10-CM | POA: Insufficient documentation

## 2013-03-23 DIAGNOSIS — I10 Essential (primary) hypertension: Secondary | ICD-10-CM | POA: Insufficient documentation

## 2013-03-23 DIAGNOSIS — R110 Nausea: Secondary | ICD-10-CM | POA: Insufficient documentation

## 2013-03-23 DIAGNOSIS — Z79899 Other long term (current) drug therapy: Secondary | ICD-10-CM | POA: Insufficient documentation

## 2013-03-23 DIAGNOSIS — Z7982 Long term (current) use of aspirin: Secondary | ICD-10-CM | POA: Insufficient documentation

## 2013-03-23 DIAGNOSIS — Z9089 Acquired absence of other organs: Secondary | ICD-10-CM | POA: Insufficient documentation

## 2013-03-23 DIAGNOSIS — IMO0002 Reserved for concepts with insufficient information to code with codable children: Secondary | ICD-10-CM | POA: Insufficient documentation

## 2013-03-23 DIAGNOSIS — Z8679 Personal history of other diseases of the circulatory system: Secondary | ICD-10-CM | POA: Insufficient documentation

## 2013-03-23 LAB — COMPREHENSIVE METABOLIC PANEL
ALT: 12 U/L (ref 0–35)
AST: 18 U/L (ref 0–37)
Albumin: 3.6 g/dL (ref 3.5–5.2)
Alkaline Phosphatase: 64 U/L (ref 39–117)
BUN: 13 mg/dL (ref 6–23)
CO2: 28 mEq/L (ref 19–32)
Calcium: 9.1 mg/dL (ref 8.4–10.5)
Chloride: 103 mEq/L (ref 96–112)
Creatinine, Ser: 0.68 mg/dL (ref 0.50–1.10)
GFR calc Af Amer: 86 mL/min — ABNORMAL LOW (ref >90)
GFR calc non Af Amer: 74 mL/min — ABNORMAL LOW (ref >90)
Glucose, Bld: 130 mg/dL — ABNORMAL HIGH (ref 70–99)
Potassium: 4.1 mEq/L (ref 3.5–5.1)
Sodium: 138 mEq/L (ref 135–145)
Total Bilirubin: 0.4 mg/dL (ref 0.3–1.2)
Total Protein: 7.6 g/dL (ref 6.0–8.3)

## 2013-03-23 LAB — CBC WITH DIFFERENTIAL/PLATELET
Basophils Absolute: 0 10*3/uL (ref 0.0–0.1)
Eosinophils Relative: 1 % (ref 0–5)
HCT: 34.4 % — ABNORMAL LOW (ref 36.0–46.0)
Hemoglobin: 11 g/dL — ABNORMAL LOW (ref 12.0–15.0)
Lymphocytes Relative: 36 % (ref 12–46)
Lymphs Abs: 1.2 10*3/uL (ref 0.7–4.0)
MCV: 85.8 fL (ref 78.0–100.0)
Monocytes Absolute: 0.2 10*3/uL (ref 0.1–1.0)
Neutro Abs: 2 10*3/uL (ref 1.7–7.7)
RBC: 4.01 MIL/uL (ref 3.87–5.11)
WBC: 3.5 10*3/uL — ABNORMAL LOW (ref 4.0–10.5)

## 2013-03-23 LAB — TROPONIN I: Troponin I: <0.3 ng/mL (ref <0.30)

## 2013-03-23 LAB — LIPASE, BLOOD: Lipase: 26 U/L (ref 11–59)

## 2013-03-23 MED ORDER — PANTOPRAZOLE SODIUM 20 MG PO TBEC: 40.0000 mg | DELAYED_RELEASE_TABLET | Freq: Every day | ORAL | Status: DC

## 2013-03-23 MED ORDER — IOHEXOL 300 MG/ML  SOLN
100.0000 mL | Freq: Once | INTRAMUSCULAR | Status: AC | PRN
  Administered 2013-03-23: 100 mL via INTRAVENOUS

## 2013-03-23 MED ORDER — PANTOPRAZOLE SODIUM 40 MG IV SOLR
40.0000 mg | Freq: Once | INTRAVENOUS | Status: AC
  Administered 2013-03-23: 40 mg via INTRAVENOUS
  Filled 2013-03-23: qty 40

## 2013-03-23 MED ORDER — TRAMADOL HCL 50 MG PO TABS
50.0000 mg | ORAL_TABLET | Freq: Two times a day (BID) | ORAL | Status: DC | PRN
  Administered 2013-03-23: 50 mg via ORAL
  Filled 2013-03-23: qty 1

## 2013-03-23 MED ORDER — ONDANSETRON HCL 4 MG/2ML IJ SOLN
4.0000 mg | Freq: Once | INTRAMUSCULAR | Status: AC
  Administered 2013-03-23: 4 mg via INTRAVENOUS
  Filled 2013-03-23: qty 2

## 2013-03-23 MED ORDER — ONDANSETRON 4 MG PO TBDP: ORAL_TABLET | ORAL | Status: DC

## 2013-03-23 MED ORDER — MORPHINE SULFATE 2 MG/ML IJ SOLN
2.0000 mg | Freq: Once | INTRAMUSCULAR | Status: AC
  Administered 2013-03-23: 2 mg via INTRAVENOUS
  Filled 2013-03-23: qty 1

## 2013-03-23 MED ORDER — TRAMADOL HCL 50 MG PO TABS: 50.0000 mg | ORAL_TABLET | Freq: Four times a day (QID) | ORAL | Status: DC | PRN

## 2013-03-23 MED ORDER — IOHEXOL 300 MG/ML  SOLN
50.0000 mL | Freq: Once | INTRAMUSCULAR | Status: AC | PRN
  Administered 2013-03-23: 50 mL via ORAL

## 2013-03-23 NOTE — ED Notes (Signed)
Pt with epigastric pain since today with nausea, denies vomiting or diarrhea, hx of acid reflux and states that she has had daily med today for GERD, LBM today x 2

## 2013-03-23 NOTE — ED Provider Notes (Signed)
CSN: [id number]     Arrival date & time 03/23/13  1306 History    This chart was scribed for Maureen Lennert, MD by Blanchard Kelch, ED Scribe. The patient was seen in room APA11/APA11. Patient's care was started at 1:55 PM.     Chief Complaint  Patient presents with  . Abdominal Pain    Patient is a 79 y.o. female presenting with abdominal pain. The history is provided by the patient and a caregiver. No language interpreter was used.  Abdominal Pain Pain location:  Epigastric Pain radiates to:  Does not radiate Pain severity:  Moderate Onset quality:  Sudden Duration:  2 days Timing:  Constant Progression:  Worsening Chronicity:  Recurrent Associated symptoms: nausea   Associated symptoms: no chest pain, no cough, no diarrhea, no fatigue, no fever, no hematochezia and no melena     HPI Comments: NOMI RUDNICKI is a 79 y.o. female who presents to the Emergency Department complaining of moderate, constant, worsening epigastric abdominal pain that began yesterday. Patient has associated nausea. Patient has history of previous similar pain that she attributed to GERD. Patient has past surgical history of cholecystectomy. She denies cough, fever, hematochezia, or melena.  PCP is Dr. Lysbeth Galas.   Past Medical History  Diagnosis Date  . Hypertension   . GERD (gastroesophageal reflux disease)   . High cholesterol   . SVT (supraventricular tachycardia)     Noted 09/2011 responsive to cardizem  . NSVT (nonsustained ventricular tachycardia)     Short run of monomorphic VT in setting of hypokalemia during 09/2011 hospitalization. EF 60-65% by echo 09/26/11  . Right bundle branch block    Past Surgical History  Procedure Laterality Date  . Cholecystectomy    . Breast lumpectomy      left breast   No family history on file. History  Substance Use Topics  . Smoking status: Never Smoker   . Smokeless tobacco: Not on file  . Alcohol Use: No   OB History   Grav Para Term Preterm  Abortions TAB SAB Ect Mult Living                 Review of Systems  Constitutional: Negative for fever, appetite change and fatigue.  HENT: Negative for congestion, sinus pressure and ear discharge.   Eyes: Negative for discharge.  Respiratory: Negative for cough.   Cardiovascular: Negative for chest pain.  Gastrointestinal: Positive for nausea and abdominal pain. Negative for diarrhea, blood in stool, melena and hematochezia.  Genitourinary: Negative for frequency.  Musculoskeletal: Negative for back pain.  Skin: Negative for rash.  Neurological: Negative for seizures and headaches.  Psychiatric/Behavioral: Negative for hallucinations.    Allergies  Review of patient's allergies indicates no known allergies.  Home Medications   Current Outpatient Rx  Name  Route  Sig  Dispense  Refill  . amLODipine (NORVASC) 5 MG tablet   Oral   Take 5 mg by mouth every morning.           Marland Kitchen aspirin EC 81 MG tablet   Oral   Take 81 mg by mouth daily.         . fluticasone (FLONASE) 50 MCG/ACT nasal spray   Nasal   Place 2 sprays into the nose daily.           . hyoscyamine (LEVSIN SL) 0.125 MG SL tablet   Sublingual   Place 0.125 mg under the tongue every 4 (four) hours as needed for cramping.         Marland Kitchen  lansoprazole (PREVACID) 30 MG capsule   Oral   Take 30 mg by mouth daily after breakfast.           . losartan (COZAAR) 100 MG tablet   Oral   Take 100 mg by mouth daily.         . metoprolol succinate (TOPROL-XL) 25 MG 24 hr tablet   Oral   Take 12.5 mg by mouth daily.         . potassium chloride SA (K-DUR,KLOR-CON) 20 MEQ tablet   Oral   Take 20 mEq by mouth daily.          Triage Vitals: BP 153/79  Pulse 74  Temp(Src) 98.1 F (36.7 C) (Oral)  Resp 18  Ht 5\' 4"  (1.626 m)  Wt 150 lb (68.04 kg)  BMI 25.73 kg/m2  SpO2 100%  Physical Exam  Nursing note and vitals reviewed. Constitutional: She is oriented to person, place, and time. She appears  well-developed.  HENT:  Head: Normocephalic.  Eyes: Conjunctivae and EOM are normal. No scleral icterus.  Neck: Neck supple. No thyromegaly present.  Cardiovascular: Normal rate and regular rhythm.  Exam reveals no gallop and no friction rub.   No murmur heard. Pulmonary/Chest: No stridor. She has no wheezes. She has no rales. She exhibits no tenderness.  Abdominal: She exhibits no distension. There is tenderness. There is no rebound.  Moderate to severe tenderness to epigastic area.  Musculoskeletal: Normal range of motion. She exhibits no edema.  Lymphadenopathy:    She has no cervical adenopathy.  Neurological: She is oriented to person, place, and time. Coordination normal.  Skin: No rash noted. No erythema.  Psychiatric: She has a normal mood and affect. Her behavior is normal.    ED Course   DIAGNOSTIC STUDIES:  Oxygen Saturation is 100% on room air, normal by my interpretation.    COORDINATION OF CARE:  2:00 PM - Patient verbalizes understanding and agrees with treatment plan.   Procedures (including critical care time)  Labs Reviewed  CBC WITH DIFFERENTIAL - Abnormal; Notable for the following:    WBC 3.5 (*)    Hemoglobin 11.0 (*)    HCT 34.4 (*)    All other components within normal limits  COMPREHENSIVE METABOLIC PANEL - Abnormal; Notable for the following:    Glucose, Bld 130 (*)    GFR calc non Af Amer 74 (*)    GFR calc Af Amer 86 (*)    All other components within normal limits  TROPONIN I  LIPASE, BLOOD   No results found. No diagnosis found.  MDM  Abdominal pain,  gerd  The chart was scribed for me under my direct supervision.  I personally performed the history, physical, and medical decision making and all procedures in the evaluation of this patient.Maureen Lennert, MD 03/23/13 [phone]

## 2013-03-23 NOTE — ED Notes (Signed)
Complain odb abdominal pain and nausea that started yesterday

## 2013-03-23 NOTE — ED Notes (Signed)
Pt had vomited oral contrast, EDP made aware and reordered more IV zofran

## 2013-04-17 ENCOUNTER — Encounter (HOSPITAL_BASED_OUTPATIENT_CLINIC_OR_DEPARTMENT_OTHER): Payer: Self-pay | Admitting: *Deleted

## 2013-04-17 ENCOUNTER — Emergency Department (HOSPITAL_BASED_OUTPATIENT_CLINIC_OR_DEPARTMENT_OTHER)
Admission: EM | Admit: 2013-04-17 | Discharge: 2013-04-18 | Disposition: A | Discharge status: Home / Self Care | Payer: Medicare Other | Attending: Emergency Medicine | Admitting: Emergency Medicine

## 2013-04-17 DIAGNOSIS — Z8639 Personal history of other endocrine, nutritional and metabolic disease: Secondary | ICD-10-CM | POA: Insufficient documentation

## 2013-04-17 DIAGNOSIS — Z7982 Long term (current) use of aspirin: Secondary | ICD-10-CM | POA: Insufficient documentation

## 2013-04-17 DIAGNOSIS — K5900 Constipation, unspecified: Secondary | ICD-10-CM

## 2013-04-17 DIAGNOSIS — K219 Gastro-esophageal reflux disease without esophagitis: Secondary | ICD-10-CM | POA: Insufficient documentation

## 2013-04-17 DIAGNOSIS — I10 Essential (primary) hypertension: Secondary | ICD-10-CM | POA: Insufficient documentation

## 2013-04-17 DIAGNOSIS — K5641 Fecal impaction: Secondary | ICD-10-CM

## 2013-04-17 DIAGNOSIS — Z79899 Other long term (current) drug therapy: Secondary | ICD-10-CM | POA: Insufficient documentation

## 2013-04-17 DIAGNOSIS — Z862 Personal history of diseases of the blood and blood-forming organs and certain disorders involving the immune mechanism: Secondary | ICD-10-CM | POA: Insufficient documentation

## 2013-04-17 LAB — CBC WITH DIFFERENTIAL/PLATELET
Basophils Absolute: 0 10*3/uL (ref 0.0–0.1)
HCT: 35.3 % — ABNORMAL LOW (ref 36.0–46.0)
Hemoglobin: 11.5 g/dL — ABNORMAL LOW (ref 12.0–15.0)
Lymphocytes Relative: 10 % — ABNORMAL LOW (ref 12–46)
Monocytes Absolute: 0.3 10*3/uL (ref 0.1–1.0)
Monocytes Relative: 5 % (ref 3–12)
Neutro Abs: 5.7 10*3/uL (ref 1.7–7.7)
WBC: 6.8 10*3/uL (ref 4.0–10.5)

## 2013-04-17 LAB — URINALYSIS, ROUTINE W REFLEX MICROSCOPIC
Hgb urine dipstick: NEGATIVE
Ketones, ur: NEGATIVE mg/dL
Protein, ur: NEGATIVE mg/dL
Urobilinogen, UA: 1 mg/dL (ref 0.0–1.0)

## 2013-04-17 MED ORDER — SIMETHICONE 40 MG/0.6ML PO SUSP
ORAL | Status: AC
  Filled 2013-04-17: qty 0.6

## 2013-04-17 MED ORDER — FENTANYL CITRATE 0.05 MG/ML IJ SOLN
50.0000 ug | Freq: Once | INTRAMUSCULAR | Status: AC
  Administered 2013-04-17: 50 ug via INTRAVENOUS
  Filled 2013-04-17: qty 2

## 2013-04-17 MED ORDER — BISACODYL 10 MG RE SUPP
10.0000 mg | Freq: Once | RECTAL | Status: AC
  Administered 2013-04-17: 10 mg via RECTAL
  Filled 2013-04-17: qty 1

## 2013-04-17 MED ORDER — SIMETHICONE 40 MG/0.6ML PO SUSP (UNIT DOSE)
40.0000 mg | Freq: Once | ORAL | Status: AC
  Administered 2013-04-17: 40 mg via ORAL
  Filled 2013-04-17: qty 0.6

## 2013-04-17 NOTE — ED Notes (Signed)
Pt c/o pain in rectum when attempting to have BM. Pt state she had small amount of hard stool on Sun. Pt states she used a "suppository" earlier today. Pt is unsure if suppository was stool softener or laxative.

## 2013-04-17 NOTE — ED Provider Notes (Signed)
CSN: [id number]     Arrival date & time 04/17/13  2137 History  This chart was scribed for Devinn Voshell Smitty Cords, MD by Clydene Laming, ED Scribe. This patient was seen in room MH03/MH03 and the patient's care was started at 11:23 PM.   Chief Complaint  Patient presents with  . Abdominal Pain   Patient is a 79 y.o. female presenting with abdominal pain. The history is provided by the patient and a relative.  Abdominal Pain Pain location:  Suprapubic Pain quality: aching and cramping   Pain radiates to:  Does not radiate Pain severity:  Moderate Onset quality:  Gradual Duration:  1 day Timing:  Constant Progression:  Unchanged Chronicity:  New Context: not trauma   Relieved by:  Nothing Worsened by:  Nothing tried Ineffective treatments:  None tried Associated symptoms: constipation   Associated symptoms: no diarrhea, no fever, no nausea and no vomiting   Risk factors: being elderly     HPI Comments: [name] is a 79 y.o. female who presents to the Emergency Department complaining of constipation that began yesterday with associated abdominal pain. Pt states she feels a blockage although bowel seems eager to be released. Pt is currently taking medications to relieve constipation. Pt has hx of abdominal pain.    Past Medical History  Diagnosis Date  . Hypertension   . GERD (gastroesophageal reflux disease)   . High cholesterol   . SVT (supraventricular tachycardia)     Noted 09/2011 responsive to cardizem  . NSVT (nonsustained ventricular tachycardia)     Short run of monomorphic VT in setting of hypokalemia during 09/2011 hospitalization. EF 60-65% by echo 09/26/11  . Right bundle branch block    Past Surgical History  Procedure Laterality Date  . Cholecystectomy    . Breast lumpectomy      left breast   No family history on file. History  Substance Use Topics  . Smoking status: Never Smoker   . Smokeless tobacco: Not on file  . Alcohol Use: No   OB History    Grav Para Term Preterm Abortions TAB SAB Ect Mult Living                 Review of Systems  Constitutional: Negative for fever.  Gastrointestinal: Positive for abdominal pain and constipation. Negative for nausea, vomiting and diarrhea.  All other systems reviewed and are negative.    Allergies  Review of patient's allergies indicates no known allergies.  Home Medications   Current Outpatient Rx  Name  Route  Sig  Dispense  Refill  . amLODipine (NORVASC) 5 MG tablet   Oral   Take 5 mg by mouth every morning.           Marland Kitchen aspirin EC 81 MG tablet   Oral   Take 81 mg by mouth daily.         . fluticasone (FLONASE) 50 MCG/ACT nasal spray   Nasal   Place 2 sprays into the nose daily.           . hyoscyamine (LEVSIN SL) 0.125 MG SL tablet   Sublingual   Place 0.125 mg under the tongue every 4 (four) hours as needed for cramping.         . lansoprazole (PREVACID) 30 MG capsule   Oral   Take 30 mg by mouth daily after breakfast.           . losartan (COZAAR) 100 MG tablet   Oral  Take 100 mg by mouth daily.         . metoprolol succinate (TOPROL-XL) 25 MG 24 hr tablet   Oral   Take 12.5 mg by mouth daily.         . ondansetron (ZOFRAN ODT) 4 MG disintegrating tablet      4mg  ODT q6 hours prn nausea/vomit   12 tablet   0   . pantoprazole (PROTONIX) 20 MG tablet   Oral   Take 2 tablets (40 mg total) by mouth daily.   30 tablet   0   . potassium chloride SA (K-DUR,KLOR-CON) 20 MEQ tablet   Oral   Take 20 mEq by mouth daily.         . traMADol (ULTRAM) 50 MG tablet   Oral   Take 1 tablet (50 mg total) by mouth every 6 (six) hours as needed for pain.   15 tablet   0    Triage Vitals :BP 178/72  Pulse 96  Temp(Src) 99.3 F (37.4 C) (Oral)  Resp 28  SpO2 100% Physical Exam  Nursing note and vitals reviewed. Constitutional: She appears well-developed and well-nourished. No distress.  HENT:  Head: Normocephalic and atraumatic.   Mouth/Throat: Oropharynx is clear and moist and mucous membranes are normal.  Eyes: EOM are normal. Pupils are equal, round, and reactive to light.  Neck: Normal range of motion. Neck supple.  Cardiovascular: Normal rate, regular rhythm and normal heart sounds.   Pulmonary/Chest: Effort normal and breath sounds normal. She has no wheezes.  Lungs clear  Abdominal: Soft. She exhibits no distension and no mass. There is no tenderness. There is no rebound and no guarding.  Hyperactive bowel sounds in left lower quadrant No prolapse organs  Genitourinary:  External inspection normal No prolapse   Musculoskeletal: Normal range of motion.  Neurological: She is alert. She has normal reflexes.  Skin: Skin is warm and dry.  Psychiatric: She has a normal mood and affect. Her behavior is normal.    ED Course  Procedures (including critical care time)  DIAGNOSTIC STUDIES: Oxygen Saturation is 100% on RA, normal by my interpretation.    COORDINATION OF CARE: 11:30 PM- Discussed treatment plan with pt at bedside. Pt verbalized understanding and agreement with plan.   Labs Review Labs Reviewed  CBC WITH DIFFERENTIAL  COMPREHENSIVE METABOLIC PANEL  URINALYSIS, ROUTINE W REFLEX MICROSCOPIC  LIPASE, BLOOD   Imaging Review No results found.  MDM  No diagnosis found. Tresa Endo nurse removed distal impaction patient feels improved I personally performed the services described in this documentation, which was scribed in my presence. The recorded information has been reviewed and is accurate.     Jasmine Awe, MD 04/18/13 [phone]

## 2013-04-17 NOTE — ED Notes (Signed)
Abdominal pain since this am. States she is unable to urinate due to a prolapsed bladder.

## 2013-04-18 ENCOUNTER — Emergency Department (HOSPITAL_BASED_OUTPATIENT_CLINIC_OR_DEPARTMENT_OTHER): Payer: Medicare Other

## 2013-04-18 LAB — COMPREHENSIVE METABOLIC PANEL
AST: 16 U/L (ref 0–37)
Alkaline Phosphatase: 70 U/L (ref 39–117)
BUN: 19 mg/dL (ref 6–23)
CO2: 25 mEq/L (ref 19–32)
Chloride: 103 mEq/L (ref 96–112)
Creatinine, Ser: 0.9 mg/dL (ref 0.50–1.10)
GFR calc non Af Amer: 54 mL/min — ABNORMAL LOW (ref >90)
Potassium: 4 mEq/L (ref 3.5–5.1)
Total Bilirubin: 0.3 mg/dL (ref 0.3–1.2)

## 2013-04-18 LAB — LIPASE, BLOOD: Lipase: 23 U/L (ref 11–59)

## 2013-04-18 MED ORDER — DOCUSATE SODIUM 100 MG PO CAPS: 100.0000 mg | ORAL_CAPSULE | Freq: Two times a day (BID) | ORAL | Status: DC

## 2013-04-18 NOTE — ED Notes (Signed)
Pt found to have large firm ball of stool just inside of rectum. Pt manually disimpacted. Small amount of blood at rectum noted after disimpaction. Pt reports moderated relief of pain at rectum.

## 2013-04-18 NOTE — ED Notes (Signed)
Patient transported to X-ray 

## 2013-05-12 ENCOUNTER — Other Ambulatory Visit: Payer: Self-pay | Admitting: Adult Health

## 2013-06-16 ENCOUNTER — Emergency Department (HOSPITAL_BASED_OUTPATIENT_CLINIC_OR_DEPARTMENT_OTHER)
Admission: EM | Admit: 2013-06-16 | Discharge: 2013-06-16 | Disposition: A | Discharge status: Home / Self Care | Payer: Medicare Other | Attending: Emergency Medicine | Admitting: Emergency Medicine

## 2013-06-16 ENCOUNTER — Encounter (HOSPITAL_BASED_OUTPATIENT_CLINIC_OR_DEPARTMENT_OTHER): Payer: Self-pay | Admitting: Emergency Medicine

## 2013-06-16 DIAGNOSIS — Z79899 Other long term (current) drug therapy: Secondary | ICD-10-CM | POA: Insufficient documentation

## 2013-06-16 DIAGNOSIS — R109 Unspecified abdominal pain: Secondary | ICD-10-CM

## 2013-06-16 DIAGNOSIS — Z862 Personal history of diseases of the blood and blood-forming organs and certain disorders involving the immune mechanism: Secondary | ICD-10-CM | POA: Insufficient documentation

## 2013-06-16 DIAGNOSIS — N390 Urinary tract infection, site not specified: Secondary | ICD-10-CM | POA: Insufficient documentation

## 2013-06-16 DIAGNOSIS — K219 Gastro-esophageal reflux disease without esophagitis: Secondary | ICD-10-CM | POA: Insufficient documentation

## 2013-06-16 DIAGNOSIS — I10 Essential (primary) hypertension: Secondary | ICD-10-CM | POA: Insufficient documentation

## 2013-06-16 DIAGNOSIS — Z8639 Personal history of other endocrine, nutritional and metabolic disease: Secondary | ICD-10-CM | POA: Insufficient documentation

## 2013-06-16 DIAGNOSIS — Z7982 Long term (current) use of aspirin: Secondary | ICD-10-CM | POA: Insufficient documentation

## 2013-06-16 LAB — LIPASE, BLOOD: Lipase: 24 U/L (ref 11–59)

## 2013-06-16 LAB — COMPREHENSIVE METABOLIC PANEL
AST: 24 U/L (ref 0–37)
Albumin: 4.2 g/dL (ref 3.5–5.2)
BUN: 15 mg/dL (ref 6–23)
Calcium: 10 mg/dL (ref 8.4–10.5)
Creatinine, Ser: 0.9 mg/dL (ref 0.50–1.10)
Total Bilirubin: 0.8 mg/dL (ref 0.3–1.2)

## 2013-06-16 LAB — CBC WITH DIFFERENTIAL/PLATELET
Basophils Absolute: 0 10*3/uL (ref 0.0–0.1)
Basophils Relative: 0 % (ref 0–1)
Eosinophils Absolute: 0 10*3/uL (ref 0.0–0.7)
Eosinophils Relative: 0 % (ref 0–5)
HCT: 38.8 % (ref 36.0–46.0)
Hemoglobin: 12.6 g/dL (ref 12.0–15.0)
MCH: 26.8 pg (ref 26.0–34.0)
MCHC: 32.5 g/dL (ref 30.0–36.0)
MCV: 82.6 fL (ref 78.0–100.0)
Monocytes Absolute: 0.3 10*3/uL (ref 0.1–1.0)
Monocytes Relative: 6 % (ref 3–12)
Neutro Abs: 2.8 10*3/uL (ref 1.7–7.7)
RDW: 15.1 % (ref 11.5–15.5)

## 2013-06-16 LAB — URINALYSIS, ROUTINE W REFLEX MICROSCOPIC
Glucose, UA: NEGATIVE mg/dL
Hgb urine dipstick: NEGATIVE
Protein, ur: 30 mg/dL — AB
Specific Gravity, Urine: 1.017 (ref 1.005–1.030)
Urobilinogen, UA: 1 mg/dL (ref 0.0–1.0)

## 2013-06-16 LAB — URINE MICROSCOPIC-ADD ON

## 2013-06-16 MED ORDER — GI COCKTAIL ~~LOC~~
30.0000 mL | Freq: Once | ORAL | Status: AC
  Administered 2013-06-16: 30 mL via ORAL
  Filled 2013-06-16: qty 30

## 2013-06-16 MED ORDER — SULFAMETHOXAZOLE-TMP DS 800-160 MG PO TABS: 1.0000 | ORAL_TABLET | Freq: Two times a day (BID) | ORAL | Status: DC

## 2013-06-16 NOTE — ED Provider Notes (Signed)
CSN: [id number]     Arrival date & time 06/16/13  1152 History   First MD Initiated Contact with Patient 06/16/13 1203     Chief Complaint  Patient presents with  . Abdominal Pain   (Consider location/radiation/quality/duration/timing/severity/associated sxs/prior Treatment) Patient is a 79 y.o. female presenting with abdominal pain. The history is provided by the patient and a relative.  Abdominal Pain Pain location:  Generalized Pain quality: sharp   Pain radiates to:  Does not radiate Pain severity:  Moderate Onset quality:  Sudden Duration:  1 day Timing:  Constant Progression:  Improving Chronicity:  Chronic Context: eating   Relieved by:  Antacids (zofran) Worsened by:  Eating Ineffective treatments:  None tried Associated symptoms: no anorexia, no belching, no chest pain, no chills, no constipation, no cough, no diarrhea, no dysuria, no fatigue, no fever, no nausea, no shortness of breath, no sore throat and no vomiting   Risk factors: being elderly and multiple surgeries   Risk factors: no alcohol abuse and no aspirin use     Past Medical History  Diagnosis Date  . Hypertension   . GERD (gastroesophageal reflux disease)   . High cholesterol   . SVT (supraventricular tachycardia)     Noted 09/2011 responsive to cardizem  . NSVT (nonsustained ventricular tachycardia)     Short run of monomorphic VT in setting of hypokalemia during 09/2011 hospitalization. EF 60-65% by echo 09/26/11  . Right bundle branch block    Past Surgical History  Procedure Laterality Date  . Cholecystectomy    . Breast lumpectomy      left breast   No family history on file. History  Substance Use Topics  . Smoking status: Never Smoker   . Smokeless tobacco: Not on file  . Alcohol Use: No   OB History   Grav Para Term Preterm Abortions TAB SAB Ect Mult Living                 Review of Systems  Constitutional: Negative for fever, chills, diaphoresis, activity change, appetite change  and fatigue.  HENT: Negative for congestion, facial swelling, rhinorrhea and sore throat.   Eyes: Negative for photophobia and discharge.  Respiratory: Negative for cough, chest tightness and shortness of breath.   Cardiovascular: Negative for chest pain, palpitations and leg swelling.  Gastrointestinal: Positive for abdominal pain. Negative for nausea, vomiting, diarrhea, constipation and anorexia.  Endocrine: Negative for polydipsia and polyuria.  Genitourinary: Negative for dysuria, frequency, difficulty urinating and pelvic pain.  Musculoskeletal: Negative for arthralgias, back pain, neck pain and neck stiffness.  Skin: Negative for color change and wound.  Allergic/Immunologic: Negative for immunocompromised state.  Neurological: Negative for facial asymmetry, weakness, numbness and headaches.  Hematological: Does not bruise/bleed easily.  Psychiatric/Behavioral: Negative for confusion and agitation.    Allergies  Review of patient's allergies indicates no known allergies.  Home Medications   Current Outpatient Rx  Name  Route  Sig  Dispense  Refill  . amLODipine (NORVASC) 5 MG tablet   Oral   Take 5 mg by mouth every morning.           Marland Kitchen aspirin EC 81 MG tablet   Oral   Take 81 mg by mouth daily.         Marland Kitchen docusate sodium (COLACE) 100 MG capsule   Oral   Take 1 capsule (100 mg total) by mouth every 12 (twelve) hours.   60 capsule   0   . fluticasone (FLONASE) 50  MCG/ACT nasal spray   Nasal   Place 2 sprays into the nose daily.           . hyoscyamine (LEVSIN SL) 0.125 MG SL tablet   Sublingual   Place 0.125 mg under the tongue every 4 (four) hours as needed for cramping.         . lansoprazole (PREVACID) 30 MG capsule   Oral   Take 30 mg by mouth daily after breakfast.           . losartan (COZAAR) 100 MG tablet   Oral   Take 100 mg by mouth daily.         . metoprolol succinate (TOPROL-XL) 25 MG 24 hr tablet   Oral   Take 12.5 mg by mouth  daily.         . ondansetron (ZOFRAN ODT) 4 MG disintegrating tablet      4mg  ODT q6 hours prn nausea/vomit   12 tablet   0   . pantoprazole (PROTONIX) 20 MG tablet   Oral   Take 2 tablets (40 mg total) by mouth daily.   30 tablet   0   . potassium chloride SA (K-DUR,KLOR-CON) 20 MEQ tablet   Oral   Take 20 mEq by mouth daily.         . potassium chloride SA (K-DUR,KLOR-CON) 20 MEQ tablet      TAKE ONE TABLET BY MOUTH ONE TIME DAILY   30 tablet   10   . sulfamethoxazole-trimethoprim (BACTRIM DS) 800-160 MG per tablet   Oral   Take 1 tablet by mouth 2 (two) times daily.   20 tablet   0   . traMADol (ULTRAM) 50 MG tablet   Oral   Take 1 tablet (50 mg total) by mouth every 6 (six) hours as needed for pain.   15 tablet   0    BP 149/71  Pulse 89  Temp(Src) 97.7 F (36.5 C) (Oral)  Resp 16  SpO2 100% Physical Exam  Constitutional: She is oriented to person, place, and time. She appears well-developed and well-nourished. No distress.  HENT:  Head: Normocephalic and atraumatic.  Mouth/Throat: No oropharyngeal exudate.  Eyes: Pupils are equal, round, and reactive to light.  Neck: Normal range of motion. Neck supple.  Cardiovascular: Normal rate, regular rhythm and normal heart sounds.  Exam reveals no gallop and no friction rub.   No murmur heard. Pulmonary/Chest: Effort normal and breath sounds normal. No respiratory distress. She has no wheezes. She has no rales.  Abdominal: Soft. Bowel sounds are normal. She exhibits no distension and no mass. There is no tenderness. There is no rigidity, no rebound and no guarding.  Musculoskeletal: Normal range of motion. She exhibits no edema and no tenderness.  Neurological: She is alert and oriented to person, place, and time.  Skin: Skin is warm and dry.  Psychiatric: She has a normal mood and affect.    ED Course  Procedures (including critical care time) Labs Review Labs Reviewed  COMPREHENSIVE METABOLIC PANEL -  Abnormal; Notable for the following:    Glucose, Bld 149 (*)    Total Protein 8.6 (*)    GFR calc non Af Amer 54 (*)    GFR calc Af Amer 63 (*)    All other components within normal limits  URINALYSIS, ROUTINE W REFLEX MICROSCOPIC - Abnormal; Notable for the following:    APPearance CLOUDY (*)    Protein, ur 30 (*)    Nitrite POSITIVE (*)  Leukocytes, UA LARGE (*)    All other components within normal limits  URINE MICROSCOPIC-ADD ON - Abnormal; Notable for the following:    Squamous Epithelial / LPF MANY (*)    Bacteria, UA MANY (*)    All other components within normal limits  URINE CULTURE  CBC WITH DIFFERENTIAL  LIPASE, BLOOD   Imaging Review No results found.  EKG Interpretation   None       MDM   1. UTI (lower urinary tract infection)   2. Recurrent abdominal pain    Pt is a 79 y.o. female with Pmhx as above who presents with ab pain for 1 day after eating eggs. Pt states similar episodes have been going on for many years w/o known cause despite imaging and outpt GI w/u.  Pt has had 5 CTs since 2008 w/o acute findings, including most recent 8/'14.  Pt states pain usually occurs after eating and she has been restricting her diet based on symptoms.  She currently feels much improved, and mild generalized ttp w/o rebound or guarding on abdominal exam.  Given multiple prior nml CT's, I do not feel this is needed and doubt acute surgical cause of abdominal pain. CBC, CMP, lipase unremarkable. Urine appears infected. Will start PO abx, ask her to f/u with PCP in 2 days.  Return precautions given for new or worsening symptoms including worsening pain, fever, inability to tolerate PO.          Shanna Cisco, MD 06/16/13 2016

## 2013-06-16 NOTE — ED Notes (Signed)
Patient here with general abdominal pain x 1 day. Reports nausea and unable to vomit, denies constipation. Reports normal BM this am. Sharp abdominal pain

## 2013-06-18 LAB — URINE CULTURE: Culture: >=100000

## 2014-01-01 DIAGNOSIS — E785 Hyperlipidemia, unspecified: Secondary | ICD-10-CM | POA: Insufficient documentation

## 2014-05-04 ENCOUNTER — Encounter (HOSPITAL_COMMUNITY): Payer: Self-pay | Admitting: Emergency Medicine

## 2014-05-04 ENCOUNTER — Observation Stay (HOSPITAL_COMMUNITY)
Admission: EM | Admit: 2014-05-04 | Discharge: 2014-05-05 | Disposition: A | Discharge status: Home / Self Care | Payer: Medicare Other | Attending: Family Medicine | Admitting: Family Medicine

## 2014-05-04 ENCOUNTER — Emergency Department (HOSPITAL_COMMUNITY): Payer: Medicare Other

## 2014-05-04 DIAGNOSIS — K219 Gastro-esophageal reflux disease without esophagitis: Secondary | ICD-10-CM | POA: Diagnosis not present

## 2014-05-04 DIAGNOSIS — E780 Pure hypercholesterolemia: Secondary | ICD-10-CM | POA: Insufficient documentation

## 2014-05-04 DIAGNOSIS — Z79899 Other long term (current) drug therapy: Secondary | ICD-10-CM | POA: Insufficient documentation

## 2014-05-04 DIAGNOSIS — I4510 Unspecified right bundle-branch block: Secondary | ICD-10-CM | POA: Diagnosis not present

## 2014-05-04 DIAGNOSIS — Z7982 Long term (current) use of aspirin: Secondary | ICD-10-CM | POA: Insufficient documentation

## 2014-05-04 DIAGNOSIS — Z7951 Long term (current) use of inhaled steroids: Secondary | ICD-10-CM | POA: Insufficient documentation

## 2014-05-04 DIAGNOSIS — X58XXXA Exposure to other specified factors, initial encounter: Secondary | ICD-10-CM | POA: Diagnosis not present

## 2014-05-04 DIAGNOSIS — T783XXA Angioneurotic edema, initial encounter: Secondary | ICD-10-CM | POA: Diagnosis not present

## 2014-05-04 DIAGNOSIS — I10 Essential (primary) hypertension: Secondary | ICD-10-CM | POA: Diagnosis not present

## 2014-05-04 DIAGNOSIS — R000 Tachycardia, unspecified: Secondary | ICD-10-CM | POA: Diagnosis not present

## 2014-05-04 DIAGNOSIS — K148 Other diseases of tongue: Secondary | ICD-10-CM | POA: Diagnosis present

## 2014-05-04 LAB — CBC WITH DIFFERENTIAL/PLATELET
BASOS PCT: 0 % (ref 0–1)
Basophils Absolute: 0 10*3/uL (ref 0.0–0.1)
Eosinophils Absolute: 0 10*3/uL (ref 0.0–0.7)
Eosinophils Relative: 1 % (ref 0–5)
HEMATOCRIT: 38.2 % (ref 36.0–46.0)
HEMOGLOBIN: 12.3 g/dL (ref 12.0–15.0)
LYMPHS PCT: 34 % (ref 12–46)
Lymphs Abs: 1.2 10*3/uL (ref 0.7–4.0)
MCH: 27.4 pg (ref 26.0–34.0)
MCHC: 32.2 g/dL (ref 30.0–36.0)
MCV: 85.1 fL (ref 78.0–100.0)
MONO ABS: 0.1 10*3/uL (ref 0.1–1.0)
MONOS PCT: 4 % (ref 3–12)
Neutro Abs: 2.1 10*3/uL (ref 1.7–7.7)
Neutrophils Relative %: 61 % (ref 43–77)
Platelets: 164 10*3/uL (ref 150–400)
RBC: 4.49 MIL/uL (ref 3.87–5.11)
RDW: 14.1 % (ref 11.5–15.5)
WBC: 3.4 10*3/uL — ABNORMAL LOW (ref 4.0–10.5)

## 2014-05-04 LAB — URINALYSIS, ROUTINE W REFLEX MICROSCOPIC
Bilirubin Urine: NEGATIVE
Glucose, UA: NEGATIVE mg/dL
HGB URINE DIPSTICK: NEGATIVE
Ketones, ur: NEGATIVE mg/dL
Nitrite: NEGATIVE
PH: 7 (ref 5.0–8.0)
Protein, ur: NEGATIVE mg/dL
Urobilinogen, UA: 0.2 mg/dL (ref 0.0–1.0)

## 2014-05-04 LAB — BASIC METABOLIC PANEL
Anion gap: 16 — ABNORMAL HIGH (ref 5–15)
BUN: 13 mg/dL (ref 6–23)
CO2: 24 meq/L (ref 19–32)
CREATININE: 0.78 mg/dL (ref 0.50–1.10)
Calcium: 9.7 mg/dL (ref 8.4–10.5)
Chloride: 100 mEq/L (ref 96–112)
GFR calc Af Amer: 82 mL/min — ABNORMAL LOW (ref >90)
GFR calc non Af Amer: 70 mL/min — ABNORMAL LOW (ref >90)
Glucose, Bld: 147 mg/dL — ABNORMAL HIGH (ref 70–99)
Potassium: 3.8 mEq/L (ref 3.7–5.3)
Sodium: 140 mEq/L (ref 137–147)

## 2014-05-04 LAB — TROPONIN I: Troponin I: <0.3 ng/mL (ref <0.30)

## 2014-05-04 LAB — URINE MICROSCOPIC-ADD ON

## 2014-05-04 MED ORDER — SODIUM CHLORIDE 0.9 % IV SOLN: INTRAVENOUS | Status: DC

## 2014-05-04 MED ORDER — SODIUM CHLORIDE 0.9 % IV SOLN
1000.0000 mL | INTRAVENOUS | Status: DC
  Administered 2014-05-04 – 2014-05-05 (×4): 1000 mL via INTRAVENOUS

## 2014-05-04 MED ORDER — AMLODIPINE BESYLATE 5 MG PO TABS
5.0000 mg | ORAL_TABLET | ORAL | Status: DC
  Administered 2014-05-05: 5 mg via ORAL
  Filled 2014-05-04: qty 1

## 2014-05-04 MED ORDER — ACETAMINOPHEN 650 MG RE SUPP: 650.0000 mg | Freq: Four times a day (QID) | RECTAL | Status: DC | PRN

## 2014-05-04 MED ORDER — ASPIRIN EC 81 MG PO TBEC
81.0000 mg | DELAYED_RELEASE_TABLET | Freq: Every day | ORAL | Status: DC
  Administered 2014-05-05: 81 mg via ORAL
  Filled 2014-05-04: qty 1

## 2014-05-04 MED ORDER — RANITIDINE HCL 150 MG/10ML PO SYRP
150.0000 mg | ORAL_SOLUTION | Freq: Once | ORAL | Status: DC
  Filled 2014-05-04: qty 10

## 2014-05-04 MED ORDER — METHYLPREDNISOLONE SODIUM SUCC 125 MG IJ SOLR
125.0000 mg | Freq: Once | INTRAMUSCULAR | Status: AC
  Administered 2014-05-04: 125 mg via INTRAVENOUS
  Filled 2014-05-04: qty 2

## 2014-05-04 MED ORDER — METOPROLOL TARTRATE 25 MG PO TABS
25.0000 mg | ORAL_TABLET | Freq: Two times a day (BID) | ORAL | Status: DC
  Administered 2014-05-04 – 2014-05-05 (×2): 25 mg via ORAL
  Filled 2014-05-04 (×2): qty 1

## 2014-05-04 MED ORDER — DIPHENHYDRAMINE HCL 25 MG PO CAPS: 25.0000 mg | ORAL_CAPSULE | Freq: Four times a day (QID) | ORAL | Status: DC | PRN

## 2014-05-04 MED ORDER — ONDANSETRON HCL 4 MG/2ML IJ SOLN: 4.0000 mg | Freq: Four times a day (QID) | INTRAMUSCULAR | Status: DC | PRN

## 2014-05-04 MED ORDER — METOPROLOL TARTRATE 1 MG/ML IV SOLN
INTRAVENOUS | Status: AC
  Administered 2014-05-04: 5 mg
  Filled 2014-05-04: qty 5

## 2014-05-04 MED ORDER — ENOXAPARIN SODIUM 40 MG/0.4ML ~~LOC~~ SOLN
40.0000 mg | SUBCUTANEOUS | Status: DC
  Administered 2014-05-04: 40 mg via SUBCUTANEOUS
  Filled 2014-05-04: qty 0.4

## 2014-05-04 MED ORDER — ONDANSETRON HCL 4 MG PO TABS: 4.0000 mg | ORAL_TABLET | Freq: Four times a day (QID) | ORAL | Status: DC | PRN

## 2014-05-04 MED ORDER — POTASSIUM CHLORIDE CRYS ER 20 MEQ PO TBCR
20.0000 meq | EXTENDED_RELEASE_TABLET | Freq: Every day | ORAL | Status: DC
  Administered 2014-05-05: 20 meq via ORAL
  Filled 2014-05-04: qty 1

## 2014-05-04 MED ORDER — FLUTICASONE PROPIONATE 50 MCG/ACT NA SUSP: 2.0000 | Freq: Every day | NASAL | Status: DC

## 2014-05-04 MED ORDER — INFLUENZA VAC SPLIT QUAD 0.5 ML IM SUSY
0.5000 mL | PREFILLED_SYRINGE | INTRAMUSCULAR | Status: AC
  Administered 2014-05-05: 0.5 mL via INTRAMUSCULAR
  Filled 2014-05-04: qty 0.5

## 2014-05-04 MED ORDER — ACETAMINOPHEN 325 MG PO TABS: 650.0000 mg | ORAL_TABLET | Freq: Four times a day (QID) | ORAL | Status: DC | PRN

## 2014-05-04 MED ORDER — SODIUM CHLORIDE 0.9 % IJ SOLN
3.0000 mL | Freq: Two times a day (BID) | INTRAMUSCULAR | Status: DC
Start: 2014-05-04 — End: 2014-05-05
  Administered 2014-05-05: 3 mL via INTRAVENOUS

## 2014-05-04 MED ORDER — PANTOPRAZOLE SODIUM 40 MG PO TBEC
40.0000 mg | DELAYED_RELEASE_TABLET | Freq: Every day | ORAL | Status: DC
  Administered 2014-05-05: 40 mg via ORAL
  Filled 2014-05-04: qty 1

## 2014-05-04 MED ORDER — DOCUSATE SODIUM 100 MG PO CAPS: 100.0000 mg | ORAL_CAPSULE | Freq: Every day | ORAL | Status: DC | PRN

## 2014-05-04 MED ORDER — FAMOTIDINE IN NACL 20-0.9 MG/50ML-% IV SOLN
INTRAVENOUS | Status: AC
  Administered 2014-05-04: 20 mg
  Filled 2014-05-04: qty 50

## 2014-05-04 MED ORDER — METHYLPREDNISOLONE SODIUM SUCC 125 MG IJ SOLR
60.0000 mg | Freq: Every day | INTRAMUSCULAR | Status: DC
  Administered 2014-05-05: 60 mg via INTRAVENOUS
  Filled 2014-05-04: qty 2

## 2014-05-04 MED ORDER — ALUM & MAG HYDROXIDE-SIMETH 200-200-20 MG/5ML PO SUSP: 30.0000 mL | Freq: Four times a day (QID) | ORAL | Status: DC | PRN

## 2014-05-04 MED ORDER — SODIUM CHLORIDE 0.9 % IV SOLN
1000.0000 mL | Freq: Once | INTRAVENOUS | Status: AC
  Administered 2014-05-04: 1000 mL via INTRAVENOUS

## 2014-05-04 NOTE — ED Notes (Signed)
PT ate breakfast this morning and noticed tongue swelling. EMS gave 0.3 epi 1:1 IM in Left Deltoid and 50mg  benadryl IV given by EMS. PT on N/C 2L at this time and denies any SOB. PT tongue swollen on arrival to ED.

## 2014-05-04 NOTE — H&P (Signed)
History and Physical  [name] VOJ:[id number] DOB: 03/18/1921 DOA: 05/04/2014  Referring physician: Dr Eulis Foster, ED physician PCP: Sherrie Mustache, MD   Chief Complaint: Tongue Swelling  HPI: [name] is a 80 y.o. female  With history of hypertension, GERD who was brought to the hospital by EMS due to tongue swelling that started around 11:00 this morning. She just returned home from [facility] when this occurred. She had taken her morning medications, which included Cozaar, at approximately 10:15 to 10:30. She has been on Cozaar for quite some time and has never had a reaction to this medication. She found it difficult to talk, but had noted difficulty breathing. EMS gave her epinephrine, which began to help with her tongue swelling, but caused SVT - this resolved after a single dose of metoprolol. Additionally, the patient received Benadryl and steroids in the ED. She notes that her tongue has decreased in swelling, but still feels that her tongue is abnormal. She reports no new medications, foods, lotions.   Review of Systems:  Pt complains of difficulty speaking due to her thick tongue.  Pt denies any fevers, chills, nausea, vomiting, chest pain, shortness of breath, palpitations, cough, throat swelling, abdominal pain, rash.  Review of systems are otherwise negative  Past Medical History  Diagnosis Date  . Hypertension   . GERD (gastroesophageal reflux disease)   . High cholesterol   . SVT (supraventricular tachycardia)     Noted 09/2011 responsive to cardizem  . NSVT (nonsustained ventricular tachycardia)     Short run of monomorphic VT in setting of hypokalemia during 09/2011 hospitalization. EF 60-65% by echo 09/26/11  . Right bundle branch block    Past Surgical History  Procedure Laterality Date  . Cholecystectomy    . Breast lumpectomy      left breast   Social History:  reports that she has never smoked. She does not have any smokeless tobacco history  on file. She reports that she does not drink alcohol or use illicit drugs. Patient lives at home & is able to participate in activities of daily living without assistance  No Known Allergies  No family history on file.    Prior to Admission medications   Medication Sig Start Date End Date Taking? Authorizing Provider  amLODipine (NORVASC) 5 MG tablet Take 5 mg by mouth every morning.     Yes Historical Provider, MD  aspirin EC 81 MG tablet Take 81 mg by mouth daily.   Yes Historical Provider, MD  fluticasone (FLONASE) 50 MCG/ACT nasal spray Place 2 sprays into the nose daily.     Yes Historical Provider, MD  losartan (COZAAR) 100 MG tablet Take 100 mg by mouth daily.   Yes Historical Provider, MD  pantoprazole (PROTONIX) 20 MG tablet Take 2 tablets (40 mg total) by mouth daily. 03/23/13  Yes Maudry Diego, MD  potassium chloride SA (K-DUR,KLOR-CON) 20 MEQ tablet Take 20 mEq by mouth daily.   Yes Historical Provider, MD    Physical Exam: BP 157/72  Pulse 78  Temp(Src) 98.4 F (36.9 C) (Oral)  Resp 16  SpO2 100%  General: Elderly black female. Awake and alert and oriented x3. No acute cardiopulmonary distress.  Eyes: Pupils equal, round, reactive to light. Extraocular muscles are intact. Sclerae anicteric and noninjected.  ENT:  Moist mucosal membranes. Tongue is mostly thick and patient has some difficulty speaking due to thickness of. No mucosal lesions. Neck: Neck supple without lymphadenopathy. No carotid bruits. No masses palpated.  Cardiovascular: Regular rate with normal S1-S2 sounds. No murmurs, rubs, gallops auscultated. No JVD.  Respiratory: Good respiratory effort with no wheezes, rales, rhonchi. Lungs clear to auscultation bilaterally.  Abdomen: Soft, nontender, nondistended. Active bowel sounds. No masses or hepatosplenomegaly  Skin: Dry, warm to touch. 2+ dorsalis pedis and radial pulses. Musculoskeletal: No calf or leg pain. All major joints not erythematous nontender.   Psychiatric: Intact judgment and insight.  Neurologic: No focal neurological deficits. Cranial nerves II through XII are grossly intact.           Labs on Admission:  Basic Metabolic Panel:  Recent Labs Lab 05/04/14 1240  NA 140  K 3.8  CL 100  CO2 24  GLUCOSE 147*  BUN 13  CREATININE 0.78  CALCIUM 9.7   Liver Function Tests: No results found for this basename: AST, ALT, ALKPHOS, BILITOT, PROT, ALBUMIN,  in the last 168 hours No results found for this basename: LIPASE, AMYLASE,  in the last 168 hours No results found for this basename: AMMONIA,  in the last 168 hours CBC:  Recent Labs Lab 05/04/14 1240  WBC 3.4*  NEUTROABS 2.1  HGB 12.3  HCT 38.2  MCV 85.1  PLT 164   Cardiac Enzymes:  Recent Labs Lab 05/04/14 1240  TROPONINI <0.30    BNP (last 3 results) No results found for this basename: PROBNP,  in the last 8760 hours CBG: No results found for this basename: GLUCAP,  in the last 168 hours  Radiological Exams on Admission: Dg Chest Port 1 View  05/04/2014   CLINICAL DATA:  Tongue swelling. Weakness. Shortness of breath and chest pain.  EXAM: PORTABLE CHEST - 1 VIEW  COMPARISON:  04/18/2013  FINDINGS: Patient rotated minimally right. Midline trachea. Normal heart size. Atherosclerosis in the transverse aorta. Mild right hemidiaphragm elevation. Remote left rib trauma. Clear lungs.  IMPRESSION: No acute cardiopulmonary disease.   Electronically Signed   By: Abigail Miyamoto M.D.   On: 05/04/2014 13:20     Assessment/Plan Present on Admission:  . Angio-edema  #1 angioedema Admit to telemetry. Continue with Solu-Medrol and Benadryl when necessary. Will start Cozaar, which is the likely causative medication. Will substitute this for her metoprolol. Continue amlodipine. We'll likely be able to go home tomorrow.  #2 hypertension Continue Norvasc and start metoprolol  GERD Continue Protonix.  DVT prophylaxis: Lovenox  Consultants: None  Code Status:  Full  Family Communication: Son in room - [employer]   Disposition Plan: Home following treatment  Time spent: 50 minutes  [place], [place] [facility] Pager [phone]  **Disclaimer: This note may have been dictated with voice recognition software. Similar sounding words can inadvertently be transcribed and this note may contain transcription errors which may not have been corrected upon publication of note.**

## 2014-05-04 NOTE — ED Notes (Signed)
Tongue swelling improved at this time. PT denies any SOB or pain at this time. PT's family at bedside and informed of pt's update and status.

## 2014-05-04 NOTE — ED Provider Notes (Signed)
CSN: [id number]     Arrival date & time    History  This chart was scribed for Maureen Blade, MD by Ludger Nutting, ED Scribe. This patient was seen in room APA01/APA01 and the patient's care was started 12:10 PM.    Chief Complaint  Patient presents with  . Allergic Reaction    The history is provided by the EMS personnel. The history is limited by the condition of the patient. No language interpreter was used.   LEVEL 5 CAVEAT - Allergic Reaction HPI Comments: [name] is a 80 y.o. female brought in by ambulance, who presents to the Emergency Department complaining of an allergic reaction with associated tongue swelling occuring after eating breakfast PTA. Per EMS, patient called for tongue swelling without respiratory distress. Patient denied recent suspicious food intake, change in medications, or similar episodes in the past. EMS states patient was having trouble speaking and was given Benadryl 50mg  and Epi 0.3 mg subcutaneously. He states patient's HR increased from 90's to 160 along with a short period of SVT after receiving the epi.   Past Medical History  Diagnosis Date  . Hypertension   . GERD (gastroesophageal reflux disease)   . High cholesterol   . SVT (supraventricular tachycardia)     Noted 09/2011 responsive to cardizem  . NSVT (nonsustained ventricular tachycardia)     Short run of monomorphic VT in setting of hypokalemia during 09/2011 hospitalization. EF 60-65% by echo 09/26/11  . Right bundle branch block    Past Surgical History  Procedure Laterality Date  . Cholecystectomy    . Breast lumpectomy      left breast   No family history on file. History  Substance Use Topics  . Smoking status: Never Smoker   . Smokeless tobacco: Not on file  . Alcohol Use: No   OB History   Grav Para Term Preterm Abortions TAB SAB Ect Mult Living                 Review of Systems  Unable to perform ROS: Acuity of condition      Allergies  Review of patient's  allergies indicates no known allergies.  Home Medications   Prior to Admission medications   Medication Sig Start Date End Date Taking? Authorizing Provider  amLODipine (NORVASC) 5 MG tablet Take 5 mg by mouth every morning.     Yes Historical Provider, MD  aspirin EC 81 MG tablet Take 81 mg by mouth daily.   Yes Historical Provider, MD  fluticasone (FLONASE) 50 MCG/ACT nasal spray Place 2 sprays into the nose daily.     Yes Historical Provider, MD  losartan (COZAAR) 100 MG tablet Take 100 mg by mouth daily.   Yes Historical Provider, MD  pantoprazole (PROTONIX) 20 MG tablet Take 2 tablets (40 mg total) by mouth daily. 03/23/13  Yes Maudry Diego, MD  potassium chloride SA (K-DUR,KLOR-CON) 20 MEQ tablet Take 20 mEq by mouth daily.   Yes Historical Provider, MD   BP 157/72  Pulse 78  Temp(Src) 98.4 F (36.9 C) (Oral)  Resp 16  SpO2 100% Physical Exam  Nursing note and vitals reviewed. Constitutional: She is oriented to person, place, and time. She appears well-developed and well-nourished.  HENT:  Head: Normocephalic and atraumatic.  Tongue is swollen but does not occlude the airway.   Eyes: Conjunctivae and EOM are normal. Pupils are equal, round, and reactive to light.  Neck: Normal range of motion and phonation normal. Neck  supple.  Cardiovascular: Normal rate and regular rhythm.   Pulmonary/Chest: Effort normal and breath sounds normal. No respiratory distress. She has no wheezes. She has no rales. She exhibits no tenderness.  Lungs are clear to auscultation bilaterally.   Abdominal: Soft. She exhibits no distension. There is no tenderness. There is no guarding.  Musculoskeletal: Normal range of motion.  Neurological: She is alert and oriented to person, place, and time. She exhibits normal muscle tone.  Skin: Skin is warm and dry.  Psychiatric: She has a normal mood and affect. Her behavior is normal. Judgment and thought content normal.    ED Course  Procedures (including  critical care time)  Immediate assessment on arrival. Indicates angioedema, tongue without airway obstruction. Initial treatment, Solu-Medrol, and Pepcid  Additional treatment required for tachycardia with atrial fibrillation, Lopressor, and IV fluid bolus  Medications  0.9 %  sodium chloride infusion (0 mLs Intravenous Stopped 05/04/14 1314)    Followed by  0.9 %  sodium chloride infusion (1,000 mLs Intravenous New Bag/Given 05/04/14 1400)  methylPREDNISolone sodium succinate (SOLU-MEDROL) 125 mg/2 mL injection 125 mg (125 mg Intravenous Given 05/04/14 1237)  famotidine (PEPCID) 20-0.9 MG/50ML-% IVPB (  Stopped 05/04/14 1310)  metoprolol (LOPRESSOR) 1 MG/ML injection (5 mg  Given 05/04/14 1238)    Patient Vitals for the past 24 hrs:  BP Temp Temp src Pulse Resp SpO2  05/04/14 1500 157/72 mmHg - - 78 16 100 %  05/04/14 1430 133/80 mmHg - - 73 20 100 %  05/04/14 1400 130/67 mmHg - - 69 13 100 %  05/04/14 1315 166/70 mmHg - - 75 15 100 %  05/04/14 1245 168/81 mmHg - - 78 13 100 %  05/04/14 1239 161/78 mmHg - - 86 16 100 %  05/04/14 1219 158/97 mmHg 98.4 F (36.9 C) Oral 106 13 99 %    3:04 PM Reevaluation with update and discussion. After initial assessment and treatment, an updated evaluation reveals. She remains in NSR with normal BP . She's now able to speak words. She feels better. Findings discussed with patient and a relative, all questions answered. [name]   3:05 PM-Consult complete with Dr. Nehemiah Settle. Patient case explained and discussed. He agrees to admit patient for further evaluation and treatment. Call ended at 15:27   CRITICAL CARE Performed by: [name] Total critical care time: 45 minutes Critical care time was exclusive of separately billable procedures and treating other patients. Critical care was necessary to treat or prevent imminent or life-threatening deterioration. Critical care was time spent personally by me on the following activities:  development of treatment plan with patient and/or surrogate as well as nursing, discussions with consultants, evaluation of patient's response to treatment, examination of patient, obtaining history from patient or surrogate, ordering and performing treatments and interventions, ordering and review of laboratory studies, ordering and review of radiographic studies, pulse oximetry and re-evaluation of patient's condition.  DIAGNOSTIC STUDIES: Oxygen Saturation is 99% on [place], normal by my interpretation.    COORDINATION OF CARE: 12:16 PM Discussed treatment plan with pt at bedside and pt agreed to plan.   Labs Review Labs Reviewed  CBC WITH DIFFERENTIAL - Abnormal; Notable for the following:    WBC 3.4 (*)    All other components within normal limits  BASIC METABOLIC PANEL - Abnormal; Notable for the following:    Glucose, Bld 147 (*)    GFR calc non Af Amer 70 (*)    GFR calc Af Amer 82 (*)  Anion gap 16 (*)    All other components within normal limits  URINALYSIS, ROUTINE W REFLEX MICROSCOPIC - Abnormal; Notable for the following:    Specific Gravity, Urine <1.005 (*)    Leukocytes, UA MODERATE (*)    All other components within normal limits  URINE MICROSCOPIC-ADD ON - Abnormal; Notable for the following:    Squamous Epithelial / LPF FEW (*)    Bacteria, UA FEW (*)    All other components within normal limits  URINE CULTURE  TROPONIN I    Imaging Review Dg Chest Port 1 View  05/04/2014   CLINICAL DATA:  Tongue swelling. Weakness. Shortness of breath and chest pain.  EXAM: PORTABLE CHEST - 1 VIEW  COMPARISON:  04/18/2013  FINDINGS: Patient rotated minimally right. Midline trachea. Normal heart size. Atherosclerosis in the transverse aorta. Mild right hemidiaphragm elevation. Remote left rib trauma. Clear lungs.  IMPRESSION: No acute cardiopulmonary disease.   Electronically Signed   By: Abigail Miyamoto M.D.   On: 05/04/2014 13:20     EKG Interpretation None        Date:  05/04/14- 12:27  Rate: 106  Rhythm: probable atrial fibrillation  QRS Axis: normal  PR and QT Intervals: QT normal  ST/T Wave abnormalities: normal  PR and QRS Conduction Disutrbances:right bundle branch block and left anterior fascicular block  Narrative Interpretation:   Old EKG Reviewed: changes noted- since last tracing, rate faster and not clearly sinus     Date: 05/04/14- 12:28  Rate: 174  Rhythm: indeterminate  QRS Axis: normal  PR and QT Intervals: QT normal  ST/T Wave abnormalities: normal  PR and QRS Conduction Disutrbances:right bundle branch block  Narrative Interpretation:   Old EKG Reviewed: changes noted- since earlier rate faster     Date: 05/04/14- 12:37  Rate: 86  Rhythm: normal sinus rhythm  QRS Axis: normal  PR and QT Intervals: PR prolonged  ST/T Wave abnormalities: normal  PR and QRS Conduction Disutrbances:right bundle branch block  Narrative Interpretation:   Old EKG Reviewed: changes noted- since earlier, rate normal and now in NSR    MDM   Final diagnoses:  Angioedema, initial encounter  Tachyarrhythmia   Angioedema, likely secondary to ARB. Tachyarrhythmia associated with epinephrine dosing. The patient stabilized, and shown improvement in emergency department. No resumption of tachyarrhythmia after a single dose of Lopressor. No other signs of allergic reaction, or anaphylaxis. She'll need to be admitted for monitoring and further treatment as needed, until her tongue swelling has improved.  Nursing Notes Reviewed/ Care Coordinated Applicable Imaging Reviewed Interpretation of Laboratory Data incorporated into ED treatment   I personally performed the services described in this documentation, which was scribed in my presence. The recorded information has been reviewed and is accurate.     Maureen Blade, MD 05/04/14 [phone]

## 2014-05-04 NOTE — ED Notes (Signed)
Report given to [facility] on 3A for room 322.

## 2014-05-05 DIAGNOSIS — T783XXA Angioneurotic edema, initial encounter: Principal | ICD-10-CM

## 2014-05-05 DIAGNOSIS — I4510 Unspecified right bundle-branch block: Secondary | ICD-10-CM

## 2014-05-05 LAB — URINE CULTURE

## 2014-05-05 MED ORDER — PREDNISONE 20 MG PO TABS: 40.0000 mg | ORAL_TABLET | Freq: Every day | ORAL | Status: DC

## 2014-05-05 MED ORDER — PREDNISONE 10 MG PO TABS: ORAL_TABLET | ORAL | Status: DC

## 2014-05-05 MED ORDER — FAMOTIDINE 20 MG PO TABS: 20.0000 mg | ORAL_TABLET | Freq: Two times a day (BID) | ORAL | Status: DC

## 2014-05-05 NOTE — Progress Notes (Signed)
Patient with orders to be discharge home. Discharge instructions given, patient verbalized understanding. Prescriptions given. Patient stable. Patient left in private vehicle with son.  

## 2014-05-05 NOTE — Discharge Summary (Signed)
Physician Discharge Summary  [name] MEQ:[id number] DOB: 03/18/1921 DOA: 05/04/2014  PCP: Sherrie Mustache, MD  Admit date: 05/04/2014 Discharge date: 05/05/2014  Recommendations for Outpatient Follow-up:  1. F/u resolution of angioedema, presumed secondary to Cozaar which has been stopped. 2. F/u HTN, may need a second agent as Cozaar has been stopped.   Follow-up Information   Follow up with Sherrie Mustache, MD. Schedule an appointment as soon as possible for a visit in 1 week.   Specialty:  Family Medicine   Contact information:   [address] [place] [phone]      Discharge Diagnoses:  1. Angioedema, likely secondary to ARB 2. Brief SVT, resolved  Discharge Condition: improved Disposition: home  Diet recommendation: heart healthy  Filed Weights   05/04/14 1712  Weight: 67.405 kg (148 lb 9.6 oz)    History of present illness:  89yow presented with thongue edema after eating breakfast. Difficulty speaking but no airway issues. Epinephrine per EMS caused brief SVT resolved with metoprolol.  Hospital Course:  [name] was admitted overnight, treated with steroids with complete and rapid resolution of angioedema, presumably secondary to ARB which has been discontinued. No features of anaphylaxis. Plan discharge home with outpatient followup.  Consultants: none Procedures: none  Discharge Instructions  Discharge Instructions   Activity as tolerated - No restrictions    Complete by:  As directed      Diet - low sodium heart healthy    Complete by:  As directed      Discharge instructions    Complete by:  As directed   Call physician or seek immediate medical attention for swelling, rash, difficulty breathing, difficulty swallowing or worsening of condition.          Current Discharge Medication List    START taking these medications   Details  famotidine (PEPCID) 20 MG tablet Take 1 tablet (20 mg total) by mouth 2 (two)  times daily. Qty: 14 tablet, Refills: 0    predniSONE (DELTASONE) 10 MG tablet Start 10/5 in the morning . Take 40 mg by mouth daily for 3 days, then take 20 mg by mouth daily for 3 days, then take 10 mg by mouth daily for 3 days, then stop. Qty: 21 tablet, Refills: 0      CONTINUE these medications which have NOT CHANGED   Details  amLODipine (NORVASC) 5 MG tablet Take 5 mg by mouth every morning.      aspirin EC 81 MG tablet Take 81 mg by mouth daily.    fluticasone (FLONASE) 50 MCG/ACT nasal spray Place 2 sprays into the nose daily.      pantoprazole (PROTONIX) 20 MG tablet Take 2 tablets (40 mg total) by mouth daily. Qty: 30 tablet, Refills: 0    potassium chloride SA (K-DUR,KLOR-CON) 20 MEQ tablet Take 20 mEq by mouth daily.      STOP taking these medications     losartan (COZAAR) 100 MG tablet        No Known Allergies  The results of significant diagnostics from this hospitalization (including imaging, microbiology, ancillary and laboratory) are listed below for reference.    Significant Diagnostic Studies: Dg Chest Port 1 View  05/04/2014   CLINICAL DATA:  Tongue swelling. Weakness. Shortness of breath and chest pain.  EXAM: PORTABLE CHEST - 1 VIEW  COMPARISON:  04/18/2013  FINDINGS: Patient rotated minimally right. Midline trachea. Normal heart size. Atherosclerosis in the transverse aorta. Mild right hemidiaphragm elevation. Remote left rib trauma.  Clear lungs.  IMPRESSION: No acute cardiopulmonary disease.   Electronically Signed   By: Abigail Miyamoto M.D.   On: 05/04/2014 13:20   Labs: Basic Metabolic Panel:  Recent Labs Lab 05/04/14 1240  NA 140  K 3.8  CL 100  CO2 24  GLUCOSE 147*  BUN 13  CREATININE 0.78  CALCIUM 9.7   CBC:  Recent Labs Lab 05/04/14 1240  WBC 3.4*  NEUTROABS 2.1  HGB 12.3  HCT 38.2  MCV 85.1  PLT 164   Cardiac Enzymes:  Recent Labs Lab 05/04/14 1240  TROPONINI <0.30    Active Problems:   Angio-edema   Time  coordinating discharge: 35 minutes  Signed:  Murray Hodgkins, MD [facility] 05/05/2014, 4:27 PM

## 2014-05-05 NOTE — Progress Notes (Signed)
UR completed 

## 2014-05-05 NOTE — Progress Notes (Signed)
  PROGRESS NOTE  [name] XTK:[id number] DOB: 03/18/1921 DOA: 05/04/2014 PCP: Sherrie Mustache, MD  Summary: 89yow presented with thongue edema after eating breakfast. Difficulty speaking but no airway issues. Epinephrine per EMS caused brief SVT resolved with metoprolol.  Assessment/Plan: 1. Angioedema, likely secondary to ARB. Completely resolved.  2. SVT secondary to epinephrine. Resolved.  3. H/o SVT, NSVT (hypokalemia), RBBB; stable.   Angioedema resolved, likely secondary to Cozaar. Plan home today on prednisone and Pepcid.  F/u with PCP 1 week.  Murray Hodgkins, MD  [facility]  Pager [phone] If 7PM-7AM, please contact night-coverage at [email or web address], password [facility] 05/05/2014, 3:21 PM  LOS: 1 day   Consultants:    Procedures:    Antibiotics:    HPI/Subjective: Feels back to normal. Tongue normal size, no trouble eating or talking, no SOB.  Objective: Filed Vitals:   05/04/14 1712 05/04/14 2220 05/05/14 0622 05/05/14 1414  BP:  153/72 146/71 138/58  Pulse:  81 72 74  Temp:  98.4 F (36.9 C) 98.4 F (36.9 C) 98.2 F (36.8 C)  TempSrc:  Oral Oral Oral  Resp:  20 20 20   Height:      Weight: 67.405 kg (148 lb 9.6 oz)     SpO2:  100% 100% 100%    Intake/Output Summary (Last 24 hours) at 05/05/14 1521 Last data filed at 05/05/14 1300  Gross per 24 hour  Intake 2891.67 ml  Output    600 ml  Net 2291.67 ml     Filed Weights   05/04/14 1712  Weight: 67.405 kg (148 lb 9.6 oz)    Exam:     Afebrile, VSS, no hypoxia  General: appears calm and comfortable  Psych: alert, speech fluent and clear  ENT: tongue, lips, O-P appear normal, no edema seen  Neck: appears grossly normal  CV: RRR no m/r/g. No LE edema.  Respiratory: CTA bilaterally no w/r/r. Normal resp effort  Data Reviewed:  Troponin, BMP, CBC unremarkable  CXR NAD  EKGs 1. ST, RBBB, LAFB 2. SVT with RBBB 3. SR, RBBB; no acute changes compared to  03/23/2013  Scheduled Meds: . sodium chloride   Intravenous STAT  . amLODipine  5 mg Oral [id number]  . aspirin EC  81 mg Oral Daily  . enoxaparin (LOVENOX) injection  40 mg Subcutaneous Q24H  . fluticasone  2 spray Each Nare Daily  . methylPREDNISolone sodium succinate  60 mg Intravenous Daily  . metoprolol tartrate  25 mg Oral BID  . pantoprazole  40 mg Oral Daily  . potassium chloride SA  20 mEq Oral Daily  . sodium chloride  3 mL Intravenous Q12H   Continuous Infusions: . sodium chloride 1,000 mL (05/05/14 1134)    Active Problems:   Angio-edema

## 2014-05-09 ENCOUNTER — Telehealth: Payer: Self-pay | Admitting: Adult Health

## 2014-05-09 NOTE — Telephone Encounter (Signed)
Pt needs appt for further refills. 

## 2014-05-09 NOTE — Telephone Encounter (Signed)
Received fax refill request  Rx # [id number] Medication:  POT CL MICRO 20 MEQ TAB SAND Qty 30  Sig:  Take one tablet by mouth daily  Physician:  Purcell Nails

## 2014-05-14 ENCOUNTER — Other Ambulatory Visit: Payer: Self-pay

## 2014-05-14 MED ORDER — POTASSIUM CHLORIDE CRYS ER 20 MEQ PO TBCR: 20.0000 meq | EXTENDED_RELEASE_TABLET | Freq: Every day | ORAL | Status: DC

## 2014-06-26 ENCOUNTER — Emergency Department (HOSPITAL_COMMUNITY)
Admission: EM | Admit: 2014-06-26 | Discharge: 2014-06-26 | Disposition: A | Discharge status: Home / Self Care | Payer: Medicare Other | Attending: Emergency Medicine | Admitting: Emergency Medicine

## 2014-06-26 ENCOUNTER — Emergency Department (HOSPITAL_COMMUNITY): Payer: Medicare Other

## 2014-06-26 ENCOUNTER — Encounter (HOSPITAL_COMMUNITY): Payer: Self-pay | Admitting: Cardiology

## 2014-06-26 DIAGNOSIS — R109 Unspecified abdominal pain: Secondary | ICD-10-CM | POA: Diagnosis not present

## 2014-06-26 DIAGNOSIS — K219 Gastro-esophageal reflux disease without esophagitis: Secondary | ICD-10-CM | POA: Diagnosis not present

## 2014-06-26 DIAGNOSIS — Z7982 Long term (current) use of aspirin: Secondary | ICD-10-CM | POA: Insufficient documentation

## 2014-06-26 DIAGNOSIS — R140 Abdominal distension (gaseous): Secondary | ICD-10-CM | POA: Diagnosis not present

## 2014-06-26 DIAGNOSIS — Z79899 Other long term (current) drug therapy: Secondary | ICD-10-CM | POA: Insufficient documentation

## 2014-06-26 DIAGNOSIS — Z8639 Personal history of other endocrine, nutritional and metabolic disease: Secondary | ICD-10-CM | POA: Diagnosis not present

## 2014-06-26 DIAGNOSIS — R110 Nausea: Secondary | ICD-10-CM | POA: Diagnosis present

## 2014-06-26 DIAGNOSIS — I10 Essential (primary) hypertension: Secondary | ICD-10-CM | POA: Insufficient documentation

## 2014-06-26 DIAGNOSIS — Z7951 Long term (current) use of inhaled steroids: Secondary | ICD-10-CM | POA: Insufficient documentation

## 2014-06-26 LAB — URINALYSIS, ROUTINE W REFLEX MICROSCOPIC
BILIRUBIN URINE: NEGATIVE
Glucose, UA: NEGATIVE mg/dL
Hgb urine dipstick: NEGATIVE
Ketones, ur: NEGATIVE mg/dL
Leukocytes, UA: NEGATIVE
NITRITE: NEGATIVE
PROTEIN: 30 mg/dL — AB
Specific Gravity, Urine: 1.025 (ref 1.005–1.030)
UROBILINOGEN UA: 0.2 mg/dL (ref 0.0–1.0)
pH: 6.5 (ref 5.0–8.0)

## 2014-06-26 LAB — COMPREHENSIVE METABOLIC PANEL
ALT: 16 U/L (ref 0–35)
ANION GAP: 12 (ref 5–15)
AST: 21 U/L (ref 0–37)
Albumin: 3.9 g/dL (ref 3.5–5.2)
Alkaline Phosphatase: 73 U/L (ref 39–117)
BUN: 13 mg/dL (ref 6–23)
CO2: 26 mEq/L (ref 19–32)
CREATININE: 0.92 mg/dL (ref 0.50–1.10)
Calcium: 9.5 mg/dL (ref 8.4–10.5)
Chloride: 99 mEq/L (ref 96–112)
GFR calc non Af Amer: 53 mL/min — ABNORMAL LOW (ref >90)
GFR, EST AFRICAN AMERICAN: 61 mL/min — AB (ref >90)
GLUCOSE: 102 mg/dL — AB (ref 70–99)
POTASSIUM: 3.6 meq/L — AB (ref 3.7–5.3)
Sodium: 137 mEq/L (ref 137–147)
TOTAL PROTEIN: 7.5 g/dL (ref 6.0–8.3)
Total Bilirubin: 0.3 mg/dL (ref 0.3–1.2)

## 2014-06-26 LAB — CBC WITH DIFFERENTIAL/PLATELET
Basophils Absolute: 0 10*3/uL (ref 0.0–0.1)
Basophils Relative: 0 % (ref 0–1)
EOS ABS: 0.1 10*3/uL (ref 0.0–0.7)
Eosinophils Relative: 1 % (ref 0–5)
HEMATOCRIT: 39.4 % (ref 36.0–46.0)
Hemoglobin: 12.7 g/dL (ref 12.0–15.0)
LYMPHS ABS: 1.1 10*3/uL (ref 0.7–4.0)
Lymphocytes Relative: 21 % (ref 12–46)
MCH: 27.2 pg (ref 26.0–34.0)
MCHC: 32.2 g/dL (ref 30.0–36.0)
MCV: 84.4 fL (ref 78.0–100.0)
MONOS PCT: 8 % (ref 3–12)
Monocytes Absolute: 0.4 10*3/uL (ref 0.1–1.0)
NEUTROS PCT: 70 % (ref 43–77)
Neutro Abs: 3.7 10*3/uL (ref 1.7–7.7)
Platelets: 210 10*3/uL (ref 150–400)
RBC: 4.67 MIL/uL (ref 3.87–5.11)
RDW: 14.8 % (ref 11.5–15.5)
WBC: 5.3 10*3/uL (ref 4.0–10.5)

## 2014-06-26 LAB — LIPASE, BLOOD: LIPASE: 28 U/L (ref 11–59)

## 2014-06-26 LAB — URINE MICROSCOPIC-ADD ON

## 2014-06-26 LAB — TROPONIN I: Troponin I: <0.3 ng/mL (ref <0.30)

## 2014-06-26 MED ORDER — SODIUM CHLORIDE 0.9 % IV BOLUS (SEPSIS)
500.0000 mL | Freq: Once | INTRAVENOUS | Status: AC
  Administered 2014-06-26: 500 mL via INTRAVENOUS

## 2014-06-26 MED ORDER — ONDANSETRON 4 MG PO TBDP: 4.0000 mg | ORAL_TABLET | Freq: Three times a day (TID) | ORAL | Status: DC | PRN

## 2014-06-26 MED ORDER — ONDANSETRON HCL 4 MG/2ML IJ SOLN
4.0000 mg | Freq: Once | INTRAMUSCULAR | Status: AC
  Administered 2014-06-26: 4 mg via INTRAVENOUS
  Filled 2014-06-26: qty 2

## 2014-06-26 MED ORDER — PANTOPRAZOLE SODIUM 40 MG IV SOLR
40.0000 mg | Freq: Once | INTRAVENOUS | Status: AC
  Administered 2014-06-26: 40 mg via INTRAVENOUS
  Filled 2014-06-26: qty 40

## 2014-06-26 NOTE — ED Provider Notes (Signed)
CSN: [id number]     Arrival date & time 06/26/14  1648 History   First MD Initiated Contact with Patient 06/26/14 1700     Chief Complaint  Patient presents with  . Nausea     HPI  She presents for evaluation of nausea. States is been present all week. Denies that it is painful. States that she feels this way after taking food. States when she drinks a little bit at a time or liquids she does not have any symptoms. Is not vomiting. He hasn't a note states she "feels like she is going to black out at times". She states it's "really not like that". She states it" just feels full". His vomiting. Good bowel movements until yesterday. She took a stool softener yesterday and had a bowel movement this morning. No blood in her stools. Not vomiting. No fever.  Past Medical History  Diagnosis Date  . Hypertension   . GERD (gastroesophageal reflux disease)   . High cholesterol   . SVT (supraventricular tachycardia)     Noted 09/2011 responsive to cardizem  . NSVT (nonsustained ventricular tachycardia)     Short run of monomorphic VT in setting of hypokalemia during 09/2011 hospitalization. EF 60-65% by echo 09/26/11  . Right bundle branch block    Past Surgical History  Procedure Laterality Date  . Cholecystectomy    . Breast lumpectomy      left breast   History reviewed. No pertinent family history. History  Substance Use Topics  . Smoking status: Never Smoker   . Smokeless tobacco: Not on file  . Alcohol Use: No   OB History    No data available     Review of Systems  Constitutional: Negative for fever, chills, diaphoresis, appetite change and fatigue.  HENT: Negative for mouth sores, sore throat and trouble swallowing.   Eyes: Negative for visual disturbance.  Respiratory: Negative for cough, chest tightness, shortness of breath and wheezing.   Cardiovascular: Negative for chest pain.  Gastrointestinal: Positive for nausea and abdominal distention. Negative for vomiting,  abdominal pain and diarrhea.  Endocrine: Negative for polydipsia, polyphagia and polyuria.  Genitourinary: Negative for dysuria, frequency and hematuria.  Musculoskeletal: Negative for gait problem.  Skin: Negative for color change, pallor and rash.  Neurological: Negative for dizziness, syncope, light-headedness and headaches.  Hematological: Does not bruise/bleed easily.  Psychiatric/Behavioral: Negative for behavioral problems and confusion.      Allergies  Cozaar  Home Medications   Prior to Admission medications   Medication Sig Start Date End Date Taking? Authorizing Provider  amLODipine (NORVASC) 5 MG tablet Take 5 mg by mouth every morning.     Yes Historical Provider, MD  pantoprazole (PROTONIX) 40 MG tablet TAKE ONE TABLET BY MOUTH ONE TIME DAILY 05/27/14  Yes Historical Provider, MD  potassium chloride SA (K-DUR,KLOR-CON) 20 MEQ tablet Take 1 tablet (20 mEq total) by mouth daily. 05/14/14  Yes Arnoldo Lenis, MD  aspirin EC 81 MG tablet Take 81 mg by mouth daily.    Historical Provider, MD  famotidine (PEPCID) 20 MG tablet Take 1 tablet (20 mg total) by mouth 2 (two) times daily. 05/05/14   Samuella Cota, MD  fluticasone (FLONASE) 50 MCG/ACT nasal spray Place 2 sprays into the nose daily.      Historical Provider, MD  pantoprazole (PROTONIX) 20 MG tablet Take 2 tablets (40 mg total) by mouth daily. Patient not taking: Reported on 06/26/2014 03/23/13   Maudry Diego, MD  predniSONE ([place])  10 MG tablet Start 10/5 in the morning . Take 40 mg by mouth daily for 3 days, then take 20 mg by mouth daily for 3 days, then take 10 mg by mouth daily for 3 days, then stop. Patient not taking: Reported on 06/26/2014 05/05/14   Samuella Cota, MD   BP 150/86 mmHg  Pulse 104  Temp(Src) 98.2 F (36.8 C) (Oral)  Resp 18  Wt 154 lb (69.854 kg)  SpO2 100% Physical Exam  Constitutional: She is oriented to person, place, and time. She appears well-developed and  well-nourished. No distress.  HENT:  Head: Normocephalic.  Eyes: Conjunctivae are normal. Pupils are equal, round, and reactive to light. No scleral icterus.  Neck: Normal range of motion. Neck supple. No thyromegaly present.  Cardiovascular: Normal rate and regular rhythm.  Exam reveals no gallop and no friction rub.   No murmur heard. Pulmonary/Chest: Effort normal and breath sounds normal. No respiratory distress. She has no wheezes. She has no rales.  Abdominal: Soft. Bowel sounds are normal. She exhibits no distension. There is no tenderness. There is no rebound.  Palpation to entire abdomen elicits no tenderness. She rested with her hands minor head and states "no, no" as I examine her abdomen. States it does not hurt her "anywhere at all". All sounds slightly hyperactive. No high-pitched rushes.  Musculoskeletal: Normal range of motion.  Neurological: She is alert and oriented to person, place, and time.  Skin: Skin is warm and dry. No rash noted.  Psychiatric: She has a normal mood and affect. Her behavior is normal.    ED Course  Procedures (including critical care time) Labs Review Labs Reviewed  COMPREHENSIVE METABOLIC PANEL - Abnormal; Notable for the following:    Potassium 3.6 (*)    Glucose, Bld 102 (*)    GFR calc non Af Amer 53 (*)    GFR calc Af Amer 61 (*)    All other components within normal limits  URINALYSIS, ROUTINE W REFLEX MICROSCOPIC - Abnormal; Notable for the following:    Protein, ur 30 (*)    All other components within normal limits  URINE MICROSCOPIC-ADD ON - Abnormal; Notable for the following:    Squamous Epithelial / LPF MANY (*)    All other components within normal limits  CBC WITH DIFFERENTIAL  LIPASE, BLOOD  TROPONIN I    Imaging Review Dg Abd Acute W/chest  06/26/2014   CLINICAL DATA:  Nausea, upper abdominal pain  EXAM: ACUTE ABDOMEN SERIES (ABDOMEN 2 VIEW & CHEST 1 VIEW)  COMPARISON:  Radiograph 05/04/2014, CT 03/23/2013  FINDINGS:  Lungs are hyperinflated. No free air beneath hemidiaphragms. No pulmonary edema or pleural fluid  No dilated loops of large or small bowel. There is gas in the rectum. Cholecystectomy clips noted. Scoliosis of the spine noted.  IMPRESSION: 1. Hyperinflated lungs with no acute findings. 2. No evidence of bowel obstruction or free air.   Electronically Signed   By: Suzy Bouchard M.D.   On: 06/26/2014 19:24     EKG Interpretation None      MDM   Final diagnoses:  Abdominal pain  Nausea    Denies pain. States that just a mild occasional nausea. Her studies here are reassuring. She states she feels "a whole whole lot better". She is taking by mouth here. I think she is appropriate for outpatient treatment. Return here with any worsening symptoms including fever, nausea that is worsening, vomiting, diarrhea, blood in vomit or stools, or any pain.  Tanna Furry, MD 06/26/14 2112

## 2014-06-26 NOTE — ED Notes (Signed)
C/o nausea,   Abdominal pain all week.  States she feels like she is going to "black out" at times.

## 2014-06-26 NOTE — Discharge Instructions (Signed)

## 2014-10-10 ENCOUNTER — Ambulatory Visit (INDEPENDENT_AMBULATORY_CARE_PROVIDER_SITE_OTHER): Payer: Medicare Other | Admitting: Otolaryngology

## 2014-10-10 DIAGNOSIS — H6122 Impacted cerumen, left ear: Secondary | ICD-10-CM | POA: Diagnosis not present

## 2014-10-10 DIAGNOSIS — H903 Sensorineural hearing loss, bilateral: Secondary | ICD-10-CM | POA: Diagnosis not present

## 2014-11-06 ENCOUNTER — Encounter (INDEPENDENT_AMBULATORY_CARE_PROVIDER_SITE_OTHER): Payer: Self-pay | Admitting: *Deleted

## 2014-11-26 ENCOUNTER — Encounter (INDEPENDENT_AMBULATORY_CARE_PROVIDER_SITE_OTHER): Payer: Self-pay | Admitting: Internal Medicine

## 2014-11-26 ENCOUNTER — Ambulatory Visit (INDEPENDENT_AMBULATORY_CARE_PROVIDER_SITE_OTHER): Payer: Medicare Other | Admitting: Internal Medicine

## 2014-11-26 VITALS — BP 142/62 | HR 88 | Temp 98.9°F | Ht 65.0 in | Wt 147.3 lb

## 2014-11-26 DIAGNOSIS — K219 Gastro-esophageal reflux disease without esophagitis: Secondary | ICD-10-CM | POA: Diagnosis not present

## 2014-11-26 NOTE — Patient Instructions (Signed)
OV as needed. Continue the Nexium.

## 2014-11-26 NOTE — Progress Notes (Signed)
   Subjective:    Patient ID: [name], female    DOB: 03/18/1921, 81 y.o.   MRN: [id number]  HPI  81 yr old female referred to our office by Dr. [facility] for GERD. She tells me there are certain foods she could not eat. She had been eating fried foods, and dairy. She is not suppose to drink coffee. After she was given the Nexium she feels a lot better. She says she is 100% better. She says she thought once she started the Nexium she could eat and drink what she wanted. Her appetite is good. No weight loss.  She eats 6 small meals a day. She is avoiding spicy foods. She lives by herself.  She cooks her own meals.  She has a BM every day. Sometimes she will skip a day.  No melena or BRRB.        Review of Systems Widowed. Five children. One deceased from brain cancer. Rest in good health.       Past Medical History  Diagnosis Date  . Hypertension   . GERD (gastroesophageal reflux disease)   . High cholesterol   . SVT (supraventricular tachycardia)     Noted 09/2011 responsive to cardizem  . NSVT (nonsustained ventricular tachycardia)     Short run of monomorphic VT in setting of hypokalemia during 09/2011 hospitalization. EF 60-65% by echo 09/26/11  . Right bundle branch block     Past Surgical History  Procedure Laterality Date  . Cholecystectomy    . Breast lumpectomy      left breast cancer years ago    Allergies  Allergen Reactions  . Cozaar [Losartan]     Oral swelling     Current Outpatient Prescriptions on File Prior to Visit  Medication Sig Dispense Refill  . amLODipine (NORVASC) 5 MG tablet Take 5 mg by mouth every morning.      Marland Kitchen aspirin EC 81 MG tablet Take 81 mg by mouth daily.    . fluticasone (FLONASE) 50 MCG/ACT nasal spray Place 2 sprays into the nose as needed.     . ondansetron (ZOFRAN ODT) 4 MG disintegrating tablet Take 1 tablet (4 mg total) by mouth every 8 (eight) hours as needed for nausea. 6 tablet 0   No current  facility-administered medications on file prior to visit.     Objective:   Physical Exam Blood pressure 142/62, pulse 88, temperature 98.9 F (37.2 C), height 5\' 5"  (1.651 m), weight 147 lb 4.8 oz (66.815 kg).  Alert and oriented. Skin warm and dry. Oral mucosa is moist.   . Sclera anicteric, conjunctivae is pink. Thyroid not enlarged. No cervical lymphadenopathy. Lungs clear. Heart regular rate and rhythm.  Abdomen is soft. Bowel sounds are positive. No hepatomegaly. No abdominal masses felt. No tenderness.  No edema to lower extremities. Patient is alert and oriented.      Assessment & Plan:  GERD: controlled at this time.  If symptoms relapse may consider EGD. I also talked with son. Continue the Nexium. OV on an as needed basis.

## 2014-11-29 ENCOUNTER — Encounter (INDEPENDENT_AMBULATORY_CARE_PROVIDER_SITE_OTHER): Payer: Self-pay

## 2015-02-07 ENCOUNTER — Emergency Department (HOSPITAL_COMMUNITY)
Admission: EM | Admit: 2015-02-07 | Discharge: 2015-02-07 | Disposition: A | Discharge status: Home / Self Care | Payer: Medicare Other | Attending: Emergency Medicine | Admitting: Emergency Medicine

## 2015-02-07 ENCOUNTER — Encounter (HOSPITAL_COMMUNITY): Payer: Self-pay | Admitting: Emergency Medicine

## 2015-02-07 DIAGNOSIS — K219 Gastro-esophageal reflux disease without esophagitis: Secondary | ICD-10-CM | POA: Insufficient documentation

## 2015-02-07 DIAGNOSIS — R42 Dizziness and giddiness: Secondary | ICD-10-CM | POA: Diagnosis not present

## 2015-02-07 DIAGNOSIS — I10 Essential (primary) hypertension: Secondary | ICD-10-CM | POA: Diagnosis not present

## 2015-02-07 DIAGNOSIS — Z8739 Personal history of other diseases of the musculoskeletal system and connective tissue: Secondary | ICD-10-CM | POA: Insufficient documentation

## 2015-02-07 DIAGNOSIS — Z7982 Long term (current) use of aspirin: Secondary | ICD-10-CM | POA: Insufficient documentation

## 2015-02-07 DIAGNOSIS — Z79899 Other long term (current) drug therapy: Secondary | ICD-10-CM | POA: Insufficient documentation

## 2015-02-07 LAB — BASIC METABOLIC PANEL
Anion gap: 8 (ref 5–15)
BUN: 17 mg/dL (ref 6–20)
CO2: 28 mmol/L (ref 22–32)
CREATININE: 0.74 mg/dL (ref 0.44–1.00)
Calcium: 8.9 mg/dL (ref 8.9–10.3)
Chloride: 102 mmol/L (ref 101–111)
GLUCOSE: 105 mg/dL — AB (ref 65–99)
Potassium: 3.8 mmol/L (ref 3.5–5.1)
Sodium: 138 mmol/L (ref 135–145)

## 2015-02-07 LAB — CBC
HEMATOCRIT: 38.4 % (ref 36.0–46.0)
Hemoglobin: 12.6 g/dL (ref 12.0–15.0)
MCH: 27.6 pg (ref 26.0–34.0)
MCHC: 32.8 g/dL (ref 30.0–36.0)
MCV: 84 fL (ref 78.0–100.0)
PLATELETS: 142 10*3/uL — AB (ref 150–400)
RBC: 4.57 MIL/uL (ref 3.87–5.11)
RDW: 14.6 % (ref 11.5–15.5)
WBC: 3.5 10*3/uL — AB (ref 4.0–10.5)

## 2015-02-07 MED ORDER — MECLIZINE HCL 12.5 MG PO TABS
12.5000 mg | ORAL_TABLET | Freq: Once | ORAL | Status: AC
  Administered 2015-02-07: 12.5 mg via ORAL
  Filled 2015-02-07: qty 1

## 2015-02-07 MED ORDER — MECLIZINE HCL 50 MG PO TABS: 25.0000 mg | ORAL_TABLET | Freq: Three times a day (TID) | ORAL | Status: DC | PRN

## 2015-02-07 MED ORDER — DIAZEPAM 5 MG PO TABS
2.5000 mg | ORAL_TABLET | Freq: Once | ORAL | Status: AC
  Administered 2015-02-07: 2.5 mg via ORAL
  Filled 2015-02-07: qty 1

## 2015-02-07 NOTE — ED Notes (Signed)
Pt states that she woke up with dizziness this morning.  States that she is only dizzy when she walks.  Denies weakness or syncope.

## 2015-02-07 NOTE — ED Notes (Signed)
While walking pt reported "I don't feel dizzy anymore I just don't feel quite right".

## 2015-02-07 NOTE — Discharge Instructions (Signed)
Benign Positional Vertigo Vertigo means you feel like you or your surroundings are moving when they are not. Benign positional vertigo is the most common form of vertigo. Benign means that the cause of your condition is not serious. Benign positional vertigo is more common in older adults. CAUSES  Benign positional vertigo is the result of an upset in the labyrinth system. This is an area in the middle ear that helps control your balance. This may be caused by a viral infection, head injury, or repetitive motion. However, often no specific cause is found. SYMPTOMS  Symptoms of benign positional vertigo occur when you move your head or eyes in different directions. Some of the symptoms may include:  Loss of balance and falls.  Vomiting.  Blurred vision.  Dizziness.  Nausea.  Involuntary eye movements (nystagmus). DIAGNOSIS  Benign positional vertigo is usually diagnosed by physical exam. If the specific cause of your benign positional vertigo is unknown, your caregiver may perform imaging tests, such as magnetic resonance imaging (MRI) or computed tomography (CT). TREATMENT  Your caregiver may recommend movements or procedures to correct the benign positional vertigo. Medicines such as meclizine, benzodiazepines, and medicines for nausea may be used to treat your symptoms. In rare cases, if your symptoms are caused by certain conditions that affect the inner ear, you may need surgery. HOME CARE INSTRUCTIONS   Follow your caregiver's instructions.  Move slowly. Do not make sudden body or head movements.  Avoid driving.  Avoid operating heavy machinery.  Avoid performing any tasks that would be dangerous to you or others during a vertigo episode.  Drink enough fluids to keep your urine clear or pale yellow. SEEK IMMEDIATE MEDICAL CARE IF:   You develop problems with walking, weakness, numbness, or using your arms, hands, or legs.  You have difficulty speaking.  You develop  severe headaches.  Your nausea or vomiting continues or gets worse.  You develop visual changes.  Your family or friends notice any behavioral changes.  Your condition gets worse.  You have a fever.  You develop a stiff neck or sensitivity to light. MAKE SURE YOU:   Understand these instructions.  Will watch your condition.  Will get help right away if you are not doing well or get worse. Document Released: 04/26/2006 Document Revised: 10/11/2011 Document Reviewed: 04/08/2011 [employer] Patient Information 2015 [employer]. This information is not intended to replace advice given to you by your health care provider. Make sure you discuss any questions you have with your health care provider.    

## 2015-02-07 NOTE — ED Provider Notes (Signed)
CSN: [id number]     Arrival date & time 02/07/15  1056 History  This chart was scribed for Virgel Manifold, MD by Eustaquio Maize, ED Scribe. This patient was seen in room APA01/APA01 and the patient's care was started at 11:33 AM.  Chief Complaint  Patient presents with  . Dizziness   The history is provided by the patient. No language interpreter was used.     HPI Comments: [name] is a 81 y.o. female brought in by ambulance, with hx HTN and HLD who presents to the Emergency Department complaining of dizziness that began this morning. Pt mentions that when she wakes up she sits up in the bed for a [name] while. Upon getting up off of the bed, she began having her symptoms. Pt states that she was off balanced and felt like she could fall at any moment. She states that she was able to ambulate but felt dizzy causing her to not want to walk. Denies any pain, ringing in ears, shortness of breath, visual changes, nausea, vomiting, or any other symptoms. No recent changes in medication.   Past Medical History  Diagnosis Date  . Hypertension   . GERD (gastroesophageal reflux disease)   . High cholesterol   . SVT (supraventricular tachycardia)     Noted 09/2011 responsive to cardizem  . NSVT (nonsustained ventricular tachycardia)     Short run of monomorphic VT in setting of hypokalemia during 09/2011 hospitalization. EF 60-65% by echo 09/26/11  . Right bundle branch block    Past Surgical History  Procedure Laterality Date  . Cholecystectomy    . Breast lumpectomy      left breast cancer years ago   History reviewed. No pertinent family history. History  Substance Use Topics  . Smoking status: Never Smoker   . Smokeless tobacco: Not on file  . Alcohol Use: No   OB History    No data available     Review of Systems  Eyes: Negative for visual disturbance.  Respiratory: Negative for shortness of breath.   Gastrointestinal: Negative for nausea and vomiting.  Musculoskeletal:  Negative for arthralgias.  Neurological: Positive for dizziness.  All other systems reviewed and are negative.  Allergies  Cozaar  Home Medications   Prior to Admission medications   Medication Sig Start Date End Date Taking? Authorizing Provider  amLODipine (NORVASC) 5 MG tablet Take 5 mg by mouth every morning.     Yes Historical Provider, MD  aspirin EC 81 MG tablet Take 81 mg by mouth daily.   Yes Historical Provider, MD  esomeprazole (NEXIUM) 40 MG capsule Take 40 mg by mouth daily.    Yes Historical Provider, MD  fluticasone (FLONASE) 50 MCG/ACT nasal spray Place 2 sprays into the nose as needed for allergies.    Yes Historical Provider, MD  meclizine (ANTIVERT) 25 MG tablet Take 25 mg by mouth 3 (three) times daily as needed for dizziness.   Yes Historical Provider, MD  polyethylene glycol (MIRALAX / GLYCOLAX) packet Take 17 g by mouth daily.   Yes Historical Provider, MD  ondansetron (ZOFRAN ODT) 4 MG disintegrating tablet Take 1 tablet (4 mg total) by mouth every 8 (eight) hours as needed for nausea. Patient not taking: Reported on 02/07/2015 06/26/14   Tanna Furry, MD   Triage Vitals: BP 180/78 mmHg  Pulse 82  Temp(Src) 98 F (36.7 C) (Oral)  Resp 18  Ht 5\' 5"  (1.651 m)  Wt 150 lb (68.04 kg)  BMI 24.96 kg/m2  SpO2 100%   Physical Exam  Constitutional: She is oriented to person, place, and time. She appears well-developed and well-nourished. No distress.  HENT:  Head: Normocephalic and atraumatic.  Right Ear: Tympanic membrane and external ear normal.  Left Ear: Tympanic membrane and external ear normal.  Eyes: Conjunctivae and EOM are normal.  Neck: Neck supple. No tracheal deviation present.  Cardiovascular: Normal rate.   Pulmonary/Chest: Effort normal. No respiratory distress.  Musculoskeletal: Normal range of motion.  Neurological: She is alert and oriented to person, place, and time.  Good finger to nose bilaterally  Skin: Skin is warm and dry.  Psychiatric:  She has a normal mood and affect. Her behavior is normal.  Nursing note and vitals reviewed.   ED Course  Procedures (including critical care time)  DIAGNOSTIC STUDIES: Oxygen Saturation is 100% on RA, normal by my interpretation.    COORDINATION OF CARE: 11:37 AM-Discussed treatment plan which includes meclizine with pt at bedside and pt agreed to plan.   Labs Review Labs Reviewed - No data to display  Imaging Review No results found.   EKG Interpretation None      MDM   Final diagnoses:  Vertigo    81 year old female with what she calls dizziness, but describes vertigo. Consistent with peripheral cause. Nonfocal neuro exam. Doesn't seem like presyncope. Hemodynamically stable. Plan symptomatically treatment. Return precautions were discussed.     Virgel Manifold, MD 02/14/15 [phone]

## 2015-04-10 ENCOUNTER — Emergency Department (HOSPITAL_COMMUNITY)
Admission: EM | Admit: 2015-04-10 | Discharge: 2015-04-10 | Disposition: A | Discharge status: Home / Self Care | Payer: Medicare Other | Attending: Emergency Medicine | Admitting: Emergency Medicine

## 2015-04-10 ENCOUNTER — Encounter (HOSPITAL_COMMUNITY): Payer: Self-pay | Admitting: Emergency Medicine

## 2015-04-10 DIAGNOSIS — Z7982 Long term (current) use of aspirin: Secondary | ICD-10-CM | POA: Insufficient documentation

## 2015-04-10 DIAGNOSIS — Z8639 Personal history of other endocrine, nutritional and metabolic disease: Secondary | ICD-10-CM | POA: Insufficient documentation

## 2015-04-10 DIAGNOSIS — K219 Gastro-esophageal reflux disease without esophagitis: Secondary | ICD-10-CM | POA: Diagnosis not present

## 2015-04-10 DIAGNOSIS — H9202 Otalgia, left ear: Secondary | ICD-10-CM | POA: Diagnosis present

## 2015-04-10 DIAGNOSIS — H9222 Otorrhagia, left ear: Secondary | ICD-10-CM | POA: Insufficient documentation

## 2015-04-10 DIAGNOSIS — Z79899 Other long term (current) drug therapy: Secondary | ICD-10-CM | POA: Diagnosis not present

## 2015-04-10 DIAGNOSIS — I10 Essential (primary) hypertension: Secondary | ICD-10-CM | POA: Diagnosis not present

## 2015-04-10 NOTE — ED Provider Notes (Signed)
CSN: [id number]     Arrival date & time 04/10/15  2029 History  This chart was scribed for [name], MD by Starleen Arms, ED Scribe. This patient was seen in room APA18/APA18 and the patient's care was started at 10:19 PM.   Chief Complaint  Patient presents with  . Otalgia   The history is provided by the patient. No language interpreter was used.   HPI Comments: [name] is a 81 y.o. female who presents to the Emergency Department complaining of continuing, unchanged left ear drainage onset this morning.  The patient was cleaning herears with a Q-tip this morning took and out her hearing aid on the left.  She saw small amount of blood on the Q-tip which continued bleeding throughout the day.  There is no pain.  She does not use blood thinners.  Pt was seen at urgent care and given antibiotic drop and is here for second opinion.  No other complanits.   Past Medical History  Diagnosis Date  . Hypertension   . GERD (gastroesophageal reflux disease)   . High cholesterol   . SVT (supraventricular tachycardia)     Noted 09/2011 responsive to cardizem  . NSVT (nonsustained ventricular tachycardia)     Short run of monomorphic VT in setting of hypokalemia during 09/2011 hospitalization. EF 60-65% by echo 09/26/11  . Right bundle branch block    Past Surgical History  Procedure Laterality Date  . Cholecystectomy    . Breast lumpectomy      left breast cancer years ago   History reviewed. No pertinent family history. Social History  Substance Use Topics  . Smoking status: Never Smoker   . Smokeless tobacco: None  . Alcohol Use: No   OB History    No data available     Review of Systems  HENT: Negative for ear pain.   Neurological: Negative for dizziness.     Allergies  Cozaar  Home Medications   Prior to Admission medications   Medication Sig Start Date End Date Taking? Authorizing Provider  amLODipine (NORVASC) 5 MG tablet Take 5 mg by mouth every morning.     Yes  Historical Provider, MD  aspirin EC 81 MG tablet Take 81 mg by mouth daily.   Yes Historical Provider, MD  esomeprazole (NEXIUM) 40 MG capsule Take 40 mg by mouth daily.    Yes Historical Provider, MD  polyethylene glycol (MIRALAX / GLYCOLAX) packet Take 17 g by mouth daily.   Yes Historical Provider, MD  fluticasone (FLONASE) 50 MCG/ACT nasal spray Place 2 sprays into the nose as needed for allergies.     Historical Provider, MD  meclizine (ANTIVERT) 25 MG tablet Take 25 mg by mouth 3 (three) times daily as needed for dizziness.    Historical Provider, MD  meclizine (ANTIVERT) 50 MG tablet Take 0.5 tablets (25 mg total) by mouth 3 (three) times daily as needed for dizziness. Patient not taking: Reported on 04/10/2015 02/07/15   Virgel Manifold, MD  ondansetron (ZOFRAN ODT) 4 MG disintegrating tablet Take 1 tablet (4 mg total) by mouth every 8 (eight) hours as needed for nausea. Patient not taking: Reported on 02/07/2015 06/26/14   Tanna Furry, MD   BP 171/84 mmHg  Pulse 107  Temp(Src) 98.1 F (36.7 C) (Oral)  Resp 18  SpO2 100% Physical Exam  Constitutional: She appears well-developed and well-nourished.  HENT:  Head: Normocephalic and atraumatic.  Left ear: blood in the EAC.  Right ear: normal TM and  EAC.     Eyes: Conjunctivae are normal. Right eye exhibits no discharge. Left eye exhibits no discharge.  Pulmonary/Chest: Effort normal. No respiratory distress.  Neurological: She is alert. Coordination normal.  Skin: Skin is warm and dry. No rash noted. She is not diaphoretic. No erythema.  Psychiatric: She has a normal mood and affect.  Nursing note and vitals reviewed.   ED Course  Procedures (including critical care time)  DIAGNOSTIC STUDIES: Oxygen Saturation is 100% on RA, normal by my interpretation.    COORDINATION OF CARE:  9:19 PM Will irrigate ear and reevaluate.  Patient acknowledges and agrees with plan.    Labs Review Labs Reviewed - No data to display  Imaging  Review No results found. I have personally reviewed and evaluated these images and lab results as part of my medical decision-making.    MDM   Final diagnoses:  Blood in ear canal, left    Irrigated ear - small amount of ooze from deep EAC on teh L -   D/w Dr. Simeon Craft - will have call office in AM to make appointment for tomorrow.   I personally performed the services described in this documentation, which was scribed in my presence. The recorded information has been reviewed and is accurate.      [name], MD 04/10/15 2219

## 2015-04-10 NOTE — Discharge Instructions (Signed)

## 2015-04-10 NOTE — ED Notes (Signed)
Patient complaining of pain and bleeding to left ear that started today. Seen in urgent care earlier and given cipro ear drops.

## 2015-04-20 ENCOUNTER — Encounter (HOSPITAL_COMMUNITY): Payer: Self-pay | Admitting: Emergency Medicine

## 2015-04-20 ENCOUNTER — Emergency Department (HOSPITAL_COMMUNITY)
Admission: EM | Admit: 2015-04-20 | Discharge: 2015-04-20 | Disposition: A | Discharge status: Home / Self Care | Payer: Medicare Other | Attending: Emergency Medicine | Admitting: Emergency Medicine

## 2015-04-20 ENCOUNTER — Emergency Department (HOSPITAL_COMMUNITY): Payer: Medicare Other

## 2015-04-20 DIAGNOSIS — Z7982 Long term (current) use of aspirin: Secondary | ICD-10-CM | POA: Insufficient documentation

## 2015-04-20 DIAGNOSIS — K219 Gastro-esophageal reflux disease without esophagitis: Secondary | ICD-10-CM | POA: Insufficient documentation

## 2015-04-20 DIAGNOSIS — Z9049 Acquired absence of other specified parts of digestive tract: Secondary | ICD-10-CM | POA: Diagnosis not present

## 2015-04-20 DIAGNOSIS — M25551 Pain in right hip: Secondary | ICD-10-CM | POA: Insufficient documentation

## 2015-04-20 DIAGNOSIS — R140 Abdominal distension (gaseous): Secondary | ICD-10-CM | POA: Insufficient documentation

## 2015-04-20 DIAGNOSIS — Z79899 Other long term (current) drug therapy: Secondary | ICD-10-CM | POA: Diagnosis not present

## 2015-04-20 DIAGNOSIS — I10 Essential (primary) hypertension: Secondary | ICD-10-CM | POA: Insufficient documentation

## 2015-04-20 DIAGNOSIS — Z8639 Personal history of other endocrine, nutritional and metabolic disease: Secondary | ICD-10-CM | POA: Insufficient documentation

## 2015-04-20 LAB — URINALYSIS, ROUTINE W REFLEX MICROSCOPIC
BILIRUBIN URINE: NEGATIVE
Glucose, UA: NEGATIVE mg/dL
KETONES UR: NEGATIVE mg/dL
Leukocytes, UA: NEGATIVE
NITRITE: NEGATIVE
PROTEIN: 30 mg/dL — AB
Specific Gravity, Urine: 1.02 (ref 1.005–1.030)
UROBILINOGEN UA: 0.2 mg/dL (ref 0.0–1.0)
pH: 6 (ref 5.0–8.0)

## 2015-04-20 LAB — COMPREHENSIVE METABOLIC PANEL
ALBUMIN: 3.6 g/dL (ref 3.5–5.0)
ALK PHOS: 64 U/L (ref 38–126)
ALT: 17 U/L (ref 14–54)
ANION GAP: 5 (ref 5–15)
AST: 21 U/L (ref 15–41)
BUN: 15 mg/dL (ref 6–20)
CALCIUM: 8.6 mg/dL — AB (ref 8.9–10.3)
CHLORIDE: 105 mmol/L (ref 101–111)
CO2: 28 mmol/L (ref 22–32)
Creatinine, Ser: 0.75 mg/dL (ref 0.44–1.00)
GFR calc non Af Amer: >60 mL/min (ref >60)
GLUCOSE: 123 mg/dL — AB (ref 65–99)
POTASSIUM: 3.2 mmol/L — AB (ref 3.5–5.1)
SODIUM: 138 mmol/L (ref 135–145)
Total Bilirubin: 0.6 mg/dL (ref 0.3–1.2)
Total Protein: 6.9 g/dL (ref 6.5–8.1)

## 2015-04-20 LAB — CBC WITH DIFFERENTIAL/PLATELET
Basophils Absolute: 0 10*3/uL (ref 0.0–0.1)
Basophils Relative: 0 %
EOS ABS: 0 10*3/uL (ref 0.0–0.7)
Eosinophils Relative: 0 %
HEMATOCRIT: 35.6 % — AB (ref 36.0–46.0)
HEMOGLOBIN: 11.6 g/dL — AB (ref 12.0–15.0)
LYMPHS ABS: 1.2 10*3/uL (ref 0.7–4.0)
Lymphocytes Relative: 22 %
MCH: 27.3 pg (ref 26.0–34.0)
MCHC: 32.6 g/dL (ref 30.0–36.0)
MCV: 83.8 fL (ref 78.0–100.0)
Monocytes Absolute: 0.5 10*3/uL (ref 0.1–1.0)
Monocytes Relative: 8 %
NEUTROS ABS: 3.9 10*3/uL (ref 1.7–7.7)
NEUTROS PCT: 70 %
Platelets: 177 10*3/uL (ref 150–400)
RBC: 4.25 MIL/uL (ref 3.87–5.11)
RDW: 14.5 % (ref 11.5–15.5)
WBC: 5.6 10*3/uL (ref 4.0–10.5)

## 2015-04-20 LAB — LIPASE, BLOOD: Lipase: 15 U/L — ABNORMAL LOW (ref 22–51)

## 2015-04-20 LAB — URINE MICROSCOPIC-ADD ON

## 2015-04-20 LAB — SEDIMENTATION RATE: SED RATE: 23 mm/h — AB (ref 0–22)

## 2015-04-20 MED ORDER — IOHEXOL 300 MG/ML  SOLN
100.0000 mL | Freq: Once | INTRAMUSCULAR | Status: AC | PRN
  Administered 2015-04-20: 100 mL via INTRAVENOUS

## 2015-04-20 MED ORDER — IOHEXOL 300 MG/ML  SOLN
25.0000 mL | Freq: Once | INTRAMUSCULAR | Status: AC | PRN
  Administered 2015-04-20: 25 mL via ORAL

## 2015-04-20 MED ORDER — HYDROCODONE-ACETAMINOPHEN 5-325 MG PO TABS: 1.0000 | ORAL_TABLET | Freq: Four times a day (QID) | ORAL | Status: DC | PRN

## 2015-04-20 MED ORDER — HYDROCODONE-ACETAMINOPHEN 5-325 MG PO TABS
1.0000 | ORAL_TABLET | Freq: Once | ORAL | Status: AC
  Administered 2015-04-20: 1 via ORAL
  Filled 2015-04-20: qty 1

## 2015-04-20 NOTE — ED Provider Notes (Signed)
CSN: [id number]     Arrival date & time 04/20/15  1441 History   First MD Initiated Contact with Patient 04/20/15 1459     Chief Complaint  Patient presents with  . Flank Pain     (Consider location/radiation/quality/duration/timing/severity/associated sxs/prior Treatment) HPI Comments: 81 year old female with past medical history including hypertension, hyperlipidemia, right bundle branch block, GERD who presents with right flank pain. The patient states that yesterday she began having pain in her right side which is close to her right hip and worse with any movement. The pain is better sitting still. The pain has then constant since it began and has worsened since yesterday. She denies any associated nausea, vomiting, diarrhea, or fevers. She endorsed increased urinary frequency and urinary retention for the past day with nursing staff but denied these symptoms for me. She denies any dysuria or hematuria. No history of kidney stones. No trauma to her hip.  Patient is a 81 y.o. female presenting with flank pain. The history is provided by the patient.  Flank Pain    Past Medical History  Diagnosis Date  . Hypertension   . GERD (gastroesophageal reflux disease)   . High cholesterol   . SVT (supraventricular tachycardia)     Noted 09/2011 responsive to cardizem  . NSVT (nonsustained ventricular tachycardia)     Short run of monomorphic VT in setting of hypokalemia during 09/2011 hospitalization. EF 60-65% by echo 09/26/11  . Right bundle branch block    Past Surgical History  Procedure Laterality Date  . Cholecystectomy    . Breast lumpectomy      left breast cancer years ago   No family history on file. Social History  Substance Use Topics  . Smoking status: Never Smoker   . Smokeless tobacco: None  . Alcohol Use: No   OB History    No data available     Review of Systems  Genitourinary: Positive for flank pain.   10 Systems reviewed and are negative for acute change  except as noted in the HPI.    Allergies  Cozaar  Home Medications   Prior to Admission medications   Medication Sig Start Date End Date Taking? Authorizing Provider  amLODipine (NORVASC) 5 MG tablet Take 5 mg by mouth every morning.     Yes Historical Provider, MD  aspirin EC 81 MG tablet Take 81 mg by mouth daily.   Yes Historical Provider, MD  esomeprazole (NEXIUM) 40 MG capsule Take 40 mg by mouth daily.    Yes Historical Provider, MD  fluticasone (FLONASE) 50 MCG/ACT nasal spray Place 2 sprays into the nose as needed for allergies.    Yes Historical Provider, MD  meclizine (ANTIVERT) 25 MG tablet Take 25 mg by mouth 3 (three) times daily as needed for dizziness.   Yes Historical Provider, MD  polyethylene glycol (MIRALAX / GLYCOLAX) packet Take 17 g by mouth daily as needed for mild constipation or moderate constipation.    Yes Historical Provider, MD  HYDROcodone-acetaminophen (NORCO/VICODIN) 5-325 MG per tablet Take 1 tablet by mouth every 6 (six) hours as needed for severe pain. 04/20/15   Sharlett Iles, MD  meclizine (ANTIVERT) 50 MG tablet Take 0.5 tablets (25 mg total) by mouth 3 (three) times daily as needed for dizziness. Patient not taking: Reported on 04/10/2015 02/07/15   Virgel Manifold, MD   BP 164/89 mmHg  Pulse 95  Temp(Src) 98.4 F (36.9 C) (Oral)  Resp 18  Ht $R'5\' 4"'jF$  (1.626 m)  Wt  150 lb (68.04 kg)  BMI 25.73 kg/m2  SpO2 93% Physical Exam  Constitutional: She is oriented to person, place, and time. She appears well-developed and well-nourished. No distress.  HENT:  Head: Normocephalic and atraumatic.  Moist mucous membranes  Eyes: Conjunctivae are normal. Pupils are equal, round, and reactive to light.  Neck: Neck supple.  Cardiovascular: Normal rate, regular rhythm, normal heart sounds and intact distal pulses.   No murmur heard. Pulmonary/Chest: Effort normal and breath sounds normal.  Abdominal: Soft. Bowel sounds are normal.  Mild abdominal distension  without tenderness to palpation, no RLQ tenderness  Genitourinary:  Vaginal prolapse outside of vaginal os, easily reducible  Musculoskeletal: She exhibits no edema.  Significant pain with flexion or internal/external rotation of right hip  Neurological: She is alert and oriented to person, place, and time.  Fluent speech  Skin: Skin is warm and dry.  Psychiatric: She has a normal mood and affect. Judgment normal.  Nursing note and vitals reviewed.   ED Course  Procedures (including critical care time) Labs Review Labs Reviewed  URINALYSIS, ROUTINE W REFLEX MICROSCOPIC (NOT AT [facility]) - Abnormal; Notable for the following:    Hgb urine dipstick TRACE (*)    Protein, ur 30 (*)    All other components within normal limits  COMPREHENSIVE METABOLIC PANEL - Abnormal; Notable for the following:    Potassium 3.2 (*)    Glucose, Bld 123 (*)    Calcium 8.6 (*)    All other components within normal limits  LIPASE, BLOOD - Abnormal; Notable for the following:    Lipase 15 (*)    All other components within normal limits  CBC WITH DIFFERENTIAL/PLATELET - Abnormal; Notable for the following:    Hemoglobin 11.6 (*)    HCT 35.6 (*)    All other components within normal limits  SEDIMENTATION RATE - Abnormal; Notable for the following:    Sed Rate 23 (*)    All other components within normal limits  URINE MICROSCOPIC-ADD ON    Imaging Review Dg Abd 1 View  04/20/2015   CLINICAL DATA:  Right lower lateral abdominal pain and abdominal distension  EXAM: ABDOMEN - 1 VIEW  COMPARISON:  Abdominal radiograph 06/26/2014.  FINDINGS: The bowel gas pattern is nonobstructive. There is a large amount of formed stool material throughout the colon. Opacity of bowel gas within the pelvis is likely secondary to a distended urinary bladder. Cholecystectomy clips are seen. There is dextro convex scoliosis of the lumbosacral spine with associated osteoarthritic changes.  IMPRESSION: Nonobstructive bowel gas  pattern.  Large amount of formed stool throughout the colon, consistent with constipation.   Electronically Signed   By: Fidela Salisbury M.D.   On: 04/20/2015 16:13   Ct Abdomen Pelvis W Contrast  04/20/2015   CLINICAL DATA:  Right lower quadrant pain since 04/19/2015. Severe right hip pain, worse with moving.  EXAM: CT ABDOMEN AND PELVIS WITH CONTRAST  TECHNIQUE: Multidetector CT imaging of the abdomen and pelvis was performed using the standard protocol following bolus administration of intravenous contrast.  CONTRAST:  30m OMNIPAQUE IOHEXOL 300 MG/ML SOLN, 1068mOMNIPAQUE IOHEXOL 300 MG/ML SOLN  COMPARISON:  03/23/2013  FINDINGS: Lower chest and abdominal wall:  No contributory findings.  Hepatobiliary: No focal liver abnormality.Cholecystectomy. Stable common bile duct appearance.  Pancreas: Unremarkable.  Spleen: Unremarkable.  Adrenals/Urinary Tract: Negative adrenals. No hydronephrosis or stone. Bladder wall thickening, chronic and potentially from chronic outlet obstruction.  Reproductive:Pelvic floor laxity. Chronic low-density thickening of the endometrial  cavity to 2 cm. Although differentiation of postobstructive fluid or endometrial thickening/neoplasia is not possible by CT, there has been no change in the appearance since 2012, suggesting a benign process. Uterine fibroids, including a 54 mm posterior low intramural fibroid. Negative adnexa.  Stomach/Bowel: No obstruction. Formed stool throughout most colonic segments, without obstruction. No inflammatory changes. Lax ileocecal mesentery, as seen on the previous study, with cecum in the central abdomen. No appendicitis.  Vascular/Lymphatic: No acute vascular abnormality. No mass or adenopathy.  Peritoneal: No ascites or pneumoperitoneum.  Musculoskeletal: No fracture or visible joint effusion to explain the right hip pain. There are stable degenerative changes to the symphysis pubis and sacroiliac joints. Multilevel facet arthropathy and  degenerative disc disease.  IMPRESSION: 1. No acute finding or change since 2014. 2. Moderate stool volume, correlate for clinical constipation. 3. Abnormal endometrial fluid or thickening, but stable since at least 2012 and favoring benign process such is cervical stenosis. Outpatient pelvic sonography could further evaluate if clinically indicated.   Electronically Signed   By: Monte Fantasia M.D.   On: 04/20/2015 17:51   Dg Hip Unilat With Pelvis 2-3 Views Right  04/20/2015   CLINICAL DATA:  Pain in the right hip region with movement. Pain began spontaneously 2 days ago with worsening.  EXAM: DG HIP (WITH OR WITHOUT PELVIS) 2-3V RIGHT  COMPARISON:  None.  FINDINGS: Hip joint space is symmetric and normal bilaterally. There are small acetabular osteophytes, frequently seen and persons of this age. No evidence of avascular necrosis or other focal lesion. Injection granulomas noted in both buttock regions. There is some degenerative change in the lumbar spine and sacroiliac joints.  IMPRESSION: No acute finding. Essentially normal appearance of the right hip for a person of this age. Lower lumbar and sacroiliac osteoarthritis.   Electronically Signed   By: Nelson Chimes M.D.   On: 04/20/2015 16:14   I have personally reviewed and evaluated these lab results as part of my medical decision-making.   EKG Interpretation None      MDM   Final diagnoses:  Right hip pain   pleasant 81 year old female who presents with 1 day of right side pain worse with movements of right hip. No history of trauma and no associated symptoms of fever, vomiting, or change in bowel movements. Patient well-appearing with reassuring vital signs at presentation. No abdominal tenderness on exam although patient had significant pain with any range of motion of her right hip.DDx is broad and includes hip pathology such as fracture, septic joint; also given abd distension, ddx also includes intra-abdominal pathology such as SBO.  Obtained above lab work including UA and ordered x-rays of right hip and KUB.  Labwork unremarkable and x-rays significant only for constipation without evidence of bowel obstruction. Because of the patient's relatively sudden onset of symptoms and the severity of her pain, obtained a CT of the abdomen and pelvis to evaluate for intra-abdominal pathology as well as to evaluate her right hip for fracture or joint effusion. CT showed constipation without any hip fracture or joint effusion. No evidence of SBO or infection. Normal WBC count and ESR are reassuring against septic joint. On reexamination, the patient noted that she had been in a car for several hours yesterday riding from [place] and her pain in her hip began after the car ride. I suspect that her symptoms may be due to musculoskeletal strain from being seated for a long period of time. She has no lower extremity swelling or pain  in her leg and no complaints of chest pain or shortness of breath to suggest blood clot. I have instructed the patient and her family on supportive care and provided short course of pain medications as well as precautions regarding side effects of medicine. Instructed to follow-up with PCP this week for reevaluation of her pain. Return precautions reviewed and patient discharged in satisfactory condition.  Sharlett Iles, MD 04/20/15 2220

## 2015-04-20 NOTE — ED Notes (Signed)
Patient given discharge instruction, verbalized understand. IV removed, band aid applied. Patient ambulatory out of the department.  

## 2015-04-20 NOTE — Discharge Instructions (Signed)
Arthralgia Arthralgia is joint pain. A joint is a place where two bones meet. Joint pain can happen for many reasons. The joint can be bruised, stiff, infected, or weak from aging. Pain usually goes away after resting and taking medicine for soreness.  HOME CARE  Rest the joint as told by your doctor.  Keep the sore joint raised (elevated) for the first 24 hours.  Put ice on the joint area.  Put ice in a plastic bag.  Place a towel between your skin and the bag.  Leave the ice on for 15-20 minutes, 03-04 times a day.  Wear your splint, casting, elastic bandage, or sling as told by your doctor.  Only take medicine as told by your doctor. Do not take aspirin.  Use crutches as told by your doctor. Do not put weight on the joint until told to by your doctor. GET HELP RIGHT AWAY IF:   You have bruising, puffiness (swelling), or more pain.  Your fingers or toes turn blue or start to lose feeling (numb).  Your medicine does not lessen the pain.  Your pain becomes severe.  You have a temperature by mouth above 102 F (38.9 C), not controlled by medicine.  You cannot move or use the joint. MAKE SURE YOU:   Understand these instructions.  Will watch your condition.  Will get help right away if you are not doing well or get worse. Document Released: 07/07/2009 Document Revised: 10/11/2011 Document Reviewed: 07/07/2009 [facility] Patient Information 2015 [place], [place]. This information is not intended to replace advice given to you by your health care provider. Make sure you discuss any questions you have with your health care provider.   USE EXTREME CAUTION WHEN TAKING PAIN MEDICATIONS AS THEY CAN MAKE YOU SLEEPY OR LIGHTHEADED. THEY CONTAIN TYLENOL SO DO NOT TAKE TYLENOL AT THE SAME TIME.

## 2015-04-20 NOTE — ED Notes (Signed)
PT c/o right flank pain with increased urinary frequency and retention x1 days. PT lives at home by herself and drives.

## 2015-04-21 DIAGNOSIS — M25551 Pain in right hip: Secondary | ICD-10-CM | POA: Insufficient documentation

## 2015-08-03 ENCOUNTER — Encounter (HOSPITAL_COMMUNITY): Payer: Self-pay | Admitting: *Deleted

## 2015-08-03 ENCOUNTER — Emergency Department (HOSPITAL_COMMUNITY): Payer: Medicare Other

## 2015-08-03 ENCOUNTER — Emergency Department (HOSPITAL_COMMUNITY)
Admission: EM | Admit: 2015-08-03 | Discharge: 2015-08-03 | Disposition: A | Discharge status: Home / Self Care | Payer: Medicare Other | Attending: Internal Medicine | Admitting: Internal Medicine

## 2015-08-03 DIAGNOSIS — N858 Other specified noninflammatory disorders of uterus: Secondary | ICD-10-CM | POA: Insufficient documentation

## 2015-08-03 DIAGNOSIS — Z8639 Personal history of other endocrine, nutritional and metabolic disease: Secondary | ICD-10-CM | POA: Diagnosis not present

## 2015-08-03 DIAGNOSIS — D4959 Neoplasm of unspecified behavior of other genitourinary organ: Secondary | ICD-10-CM | POA: Diagnosis not present

## 2015-08-03 DIAGNOSIS — Z79899 Other long term (current) drug therapy: Secondary | ICD-10-CM | POA: Insufficient documentation

## 2015-08-03 DIAGNOSIS — Z7982 Long term (current) use of aspirin: Secondary | ICD-10-CM | POA: Insufficient documentation

## 2015-08-03 DIAGNOSIS — Z9049 Acquired absence of other specified parts of digestive tract: Secondary | ICD-10-CM | POA: Insufficient documentation

## 2015-08-03 DIAGNOSIS — R112 Nausea with vomiting, unspecified: Secondary | ICD-10-CM

## 2015-08-03 DIAGNOSIS — K219 Gastro-esophageal reflux disease without esophagitis: Secondary | ICD-10-CM | POA: Diagnosis not present

## 2015-08-03 DIAGNOSIS — I10 Essential (primary) hypertension: Secondary | ICD-10-CM | POA: Insufficient documentation

## 2015-08-03 DIAGNOSIS — N9489 Other specified conditions associated with female genital organs and menstrual cycle: Secondary | ICD-10-CM

## 2015-08-03 DIAGNOSIS — R1032 Left lower quadrant pain: Secondary | ICD-10-CM | POA: Diagnosis present

## 2015-08-03 LAB — COMPREHENSIVE METABOLIC PANEL
ALBUMIN: 3.3 g/dL — AB (ref 3.5–5.0)
ALK PHOS: 69 U/L (ref 38–126)
ALT: 13 U/L — AB (ref 14–54)
ANION GAP: 6 (ref 5–15)
AST: 17 U/L (ref 15–41)
BUN: 12 mg/dL (ref 6–20)
CALCIUM: 8.4 mg/dL — AB (ref 8.9–10.3)
CO2: 27 mmol/L (ref 22–32)
Chloride: 108 mmol/L (ref 101–111)
Creatinine, Ser: 0.62 mg/dL (ref 0.44–1.00)
GFR calc Af Amer: >60 mL/min (ref >60)
GFR calc non Af Amer: >60 mL/min (ref >60)
GLUCOSE: 104 mg/dL — AB (ref 65–99)
Potassium: 3.3 mmol/L — ABNORMAL LOW (ref 3.5–5.1)
SODIUM: 141 mmol/L (ref 135–145)
TOTAL PROTEIN: 6.5 g/dL (ref 6.5–8.1)
Total Bilirubin: 1.1 mg/dL (ref 0.3–1.2)

## 2015-08-03 LAB — CBC WITH DIFFERENTIAL/PLATELET
BASOS PCT: 0 %
Basophils Absolute: 0 10*3/uL (ref 0.0–0.1)
EOS ABS: 0 10*3/uL (ref 0.0–0.7)
Eosinophils Relative: 1 %
HCT: 32.3 % — ABNORMAL LOW (ref 36.0–46.0)
HEMOGLOBIN: 10.4 g/dL — AB (ref 12.0–15.0)
Lymphocytes Relative: 16 %
Lymphs Abs: 0.9 10*3/uL (ref 0.7–4.0)
MCH: 26.9 pg (ref 26.0–34.0)
MCHC: 32.2 g/dL (ref 30.0–36.0)
MCV: 83.5 fL (ref 78.0–100.0)
Monocytes Absolute: 0.5 10*3/uL (ref 0.1–1.0)
Monocytes Relative: 9 %
NEUTROS PCT: 74 %
Neutro Abs: 4.3 10*3/uL (ref 1.7–7.7)
PLATELETS: 145 10*3/uL — AB (ref 150–400)
RBC: 3.87 MIL/uL (ref 3.87–5.11)
RDW: 14.8 % (ref 11.5–15.5)
WBC: 5.8 10*3/uL (ref 4.0–10.5)

## 2015-08-03 LAB — LIPASE, BLOOD: Lipase: 17 U/L (ref 11–51)

## 2015-08-03 MED ORDER — DIATRIZOATE MEGLUMINE & SODIUM 66-10 % PO SOLN
ORAL | Status: AC
  Filled 2015-08-03: qty 30

## 2015-08-03 MED ORDER — IOHEXOL 300 MG/ML  SOLN
100.0000 mL | Freq: Once | INTRAMUSCULAR | Status: AC | PRN
  Administered 2015-08-03: 100 mL via INTRAVENOUS

## 2015-08-03 MED ORDER — ONDANSETRON HCL 4 MG/2ML IJ SOLN
4.0000 mg | Freq: Once | INTRAMUSCULAR | Status: AC
  Administered 2015-08-03: 4 mg via INTRAVENOUS
  Filled 2015-08-03: qty 2

## 2015-08-03 MED ORDER — ONDANSETRON HCL 4 MG PO TABS: 4.0000 mg | ORAL_TABLET | Freq: Three times a day (TID) | ORAL | Status: DC | PRN

## 2015-08-03 MED ORDER — OXYCODONE-ACETAMINOPHEN 5-325 MG PO TABS: 1.0000 | ORAL_TABLET | Freq: Four times a day (QID) | ORAL | Status: DC | PRN

## 2015-08-03 MED ORDER — SODIUM CHLORIDE 0.9 % IV BOLUS (SEPSIS)
500.0000 mL | Freq: Once | INTRAVENOUS | Status: AC
  Administered 2015-08-03: 500 mL via INTRAVENOUS

## 2015-08-03 MED ORDER — FENTANYL CITRATE (PF) 100 MCG/2ML IJ SOLN
50.0000 ug | Freq: Once | INTRAMUSCULAR | Status: AC
  Administered 2015-08-03: 50 ug via INTRAVENOUS
  Filled 2015-08-03: qty 2

## 2015-08-03 NOTE — ED Provider Notes (Addendum)
CSN: [id number]     Arrival date & time 08/03/15  0902 History  By signing my name below, I, [facility], attest that this documentation has been prepared under the direction and in the presence of Nat Christen, MD. Electronically Signed: Virgel Bouquet, ED Scribe. 08/03/2015. 10:17 AM.   Chief Complaint  Patient presents with  . Abdominal Pain   The history is provided by the patient. No language interpreter was used.   HPI Comments: [name] is a 82 y.o. female who presents to the Emergency Department complaining of intermittent, moderate, gradually worsening, sharp LLQ abdominal pain. Patient reports that she was seen in an urgent care 2 days ago where she was diagnosed with a UTI, received antibiotics, and advised to come to the ED if symptoms worsen. She endorses that her dark urine and that her last BM was 2-3 days ago. Pain is worse with movement. Per patient, she takes Miralax normally but notes that she has had no relief with this product. Patient denies dysuria and difficultly eating.  PCP: Dr. Dione Housekeeper  Past Medical History  Diagnosis Date  . Hypertension   . GERD (gastroesophageal reflux disease)   . High cholesterol   . SVT (supraventricular tachycardia) ([place])     Noted 09/2011 responsive to cardizem  . NSVT (nonsustained ventricular tachycardia) ([facility])     Short run of monomorphic VT in setting of hypokalemia during 09/2011 hospitalization. EF 60-65% by echo 09/26/11  . Right bundle branch block    Past Surgical History  Procedure Laterality Date  . Cholecystectomy    . Breast lumpectomy      left breast cancer years ago   No family history on file. Social History  Substance Use Topics  . Smoking status: Never Smoker   . Smokeless tobacco: None  . Alcohol Use: No   OB History    No data available     Review of Systems A complete 10 system review of systems was obtained and all systems are negative except as noted in the HPI and PMH.     Allergies  Cozaar  Home Medications   Prior to Admission medications   Medication Sig Start Date End Date Taking? Authorizing Provider  amLODipine (NORVASC) 5 MG tablet Take 5 mg by mouth every morning.     Yes Historical Provider, MD  aspirin EC 81 MG tablet Take 81 mg by mouth daily.   Yes Historical Provider, MD  fluticasone (FLONASE) 50 MCG/ACT nasal spray Place 2 sprays into the nose as needed for allergies.    Yes Historical Provider, MD  meclizine (ANTIVERT) 25 MG tablet Take 25 mg by mouth 3 (three) times daily as needed for dizziness.   Yes Historical Provider, MD  nitrofurantoin (MACRODANTIN) 100 MG capsule Take 100 mg by mouth 2 (two) times daily. Starting 08/01/15 x 7 days.   Yes Historical Provider, MD  omeprazole (PRILOSEC) 20 MG capsule Take 20 mg by mouth daily.   Yes Historical Provider, MD  polyethylene glycol (MIRALAX / GLYCOLAX) packet Take 17 g by mouth daily.    Yes Historical Provider, MD  HYDROcodone-acetaminophen (NORCO/VICODIN) 5-325 MG per tablet Take 1 tablet by mouth every 6 (six) hours as needed for severe pain. Patient not taking: Reported on 08/03/2015 04/20/15   Sharlett Iles, MD  meclizine (ANTIVERT) 50 MG tablet Take 0.5 tablets (25 mg total) by mouth 3 (three) times daily as needed for dizziness. Patient not taking: Reported on 04/10/2015 02/07/15   Virgel Manifold,  MD   BP 150/72 mmHg  Pulse 81  Temp(Src) 98.1 F (36.7 C) (Oral)  Resp 18  Ht 5\' 3"  (1.6 m)  Wt 140 lb (63.504 kg)  BMI 24.81 kg/m2  SpO2 99% Physical Exam  Constitutional: She is oriented to person, place, and time. She appears well-developed and well-nourished.  HENT:  Head: Normocephalic and atraumatic.  Eyes: Conjunctivae and EOM are normal. Pupils are equal, round, and reactive to light.  Neck: Normal range of motion. Neck supple.  Cardiovascular: Normal rate and regular rhythm.   Pulmonary/Chest: Effort normal and breath sounds normal.  Abdominal: Soft. Bowel sounds are  normal.  Tenderness in the LLQ.  Musculoskeletal: Normal range of motion.  Neurological: She is alert and oriented to person, place, and time.  Skin: Skin is warm and dry.  Psychiatric: She has a normal mood and affect. Her behavior is normal.  Nursing note and vitals reviewed.   ED Course  Procedures   DIAGNOSTIC STUDIES: Oxygen Saturation is 99% on RA, normal by my interpretation.    COORDINATION OF CARE: 9:40 AM Will order abdominal CT scan and pain medication Discussed treatment plan with pt at bedside and pt agreed to plan.  Labs Review Labs Reviewed  CBC WITH DIFFERENTIAL/PLATELET - Abnormal; Notable for the following:    Hemoglobin 10.4 (*)    HCT 32.3 (*)    Platelets 145 (*)    All other components within normal limits  COMPREHENSIVE METABOLIC PANEL - Abnormal; Notable for the following:    Potassium 3.3 (*)    Glucose, Bld 104 (*)    Calcium 8.4 (*)    Albumin 3.3 (*)    ALT 13 (*)    All other components within normal limits  LIPASE, BLOOD  URINALYSIS, ROUTINE W REFLEX MICROSCOPIC (NOT AT [facility])    Imaging Review Ct Abdomen Pelvis W Contrast  08/03/2015  CLINICAL DATA:  82 year old female with severe left lower quadrant abdominal pain 2-3 days ago. Recently diagnosed with urinary tract infection. Dark urine. EXAM: CT ABDOMEN AND PELVIS WITH CONTRAST TECHNIQUE: Multidetector CT imaging of the abdomen and pelvis was performed using the standard protocol following bolus administration of intravenous contrast. CONTRAST:  133mL OMNIPAQUE IOHEXOL 300 MG/ML  SOLN COMPARISON:  CT the abdomen and pelvis 04/20/2015. FINDINGS: Lower chest:  Mild cardiomegaly. Hepatobiliary: Status post cholecystectomy. No cystic or solid hepatic lesions. No intra or extrahepatic biliary ductal dilatation. Pancreas: No pancreatic mass. No pancreatic ductal dilatation. No pancreatic or peripancreatic fluid or inflammatory changes. Spleen: Unremarkable. Adrenals/Urinary Tract: Sub cm low-attenuation  lesions in the kidneys bilaterally are too small to definitively characterize, but statistically likely tiny cysts. No other suspicious renal lesions. Bilateral adrenal glands are normal in appearance. No hydroureteronephrosis. Urinary bladder is normal in appearance. Stomach/Bowel: Normal appearance of the stomach. No pathologic dilatation of small bowel or colon. Appendix is not confidently identified and may be surgically absent. Regardless, there are no inflammatory changes adjacent to the cecum to suggest an acute appendicitis at this time. Vascular/Lymphatic: Atherosclerosis throughout the abdominal and pelvic vasculature, without evidence of aneurysm or dissection. No lymphadenopathy noted in the abdomen or pelvis. Reproductive: In the posterior aspect of the uterine body there is again a well-defined heterogeneously enhancing and partially calcified 5.7 x 4.8 x 6.0 cm lesion, which is most compatible with a large fibroid. Notably, compared to prior examinations there is markedly increasing fluid in the endometrial canal, and today's study now demonstrates some enhancing soft tissue in the endometrial canal that  measures approximately 2.3 x 1.2 x 3.3 cm (axial image 67 of series 2 and sagittal image 66 of series 5), highly concerning for endometrial neoplasm. Ovaries are atrophic. Other: No significant volume of ascites.  No pneumoperitoneum. Musculoskeletal: There are no aggressive appearing lytic or blastic lesions noted in the visualized portions of the skeleton. IMPRESSION: 1. No acute findings in the abdomen or pelvis. 2. However, there is evidence of enhancing soft tissue in the endometrial canal, associated with markedly increased endometrial fluid compared to prior examinations; findings that are concerning for endometrial neoplasm. Further nonemergent outpatient evaluation by Gyn-Onc strongly recommended in the near future. 3. Status post cholecystectomy. 4. Extensive atherosclerosis. These results  were called by telephone at the time of interpretation on 08/03/2015 at 11:47 am to Dr. Nat Christen, who verbally acknowledged these results. Electronically Signed   By: Vinnie Langton M.D.   On: 08/03/2015 11:49   I have personally reviewed and evaluated these images and lab results as part of my medical decision-making.   EKG Interpretation None      MDM   Final diagnoses:  Endometrial mass    CT scan reveals a possible endometrial neoplasm. Patient has required pain management. Discussed with Dr. Elonda Husky.  Also discussed with Dr. Roderic Palau.  He will evaluate patient.  Patient evaluated by Dr. Roderic Palau who decided on discharge planning  I personally performed the services described in this documentation, which was scribed in my presence. The recorded information has been reviewed and is accurate.    Nat Christen, MD 08/03/15 [place], MD 08/05/15 [phone]

## 2015-08-03 NOTE — ED Notes (Signed)
Pt brought in by [facility] from home c/o intermittent LLQ pain. Pt went to Urgent Care on Friday and her urine was checked and told she had an UTI and was given antibiotics. Pt reports the pain and symptoms are worse. Pt denies fever, n/v/d, urinary or bowel symptoms.

## 2015-08-03 NOTE — Consult Note (Signed)
[facility] Medical Consultation  [name] [id number] DOB: 03/18/1921 DOA: 08/03/2015 PCP: Sherrie Mustache, MD   Requesting physician: Dr. Lacinda Axon, ER Date of consultation: 08/03/2015 Reason for consultation: abdominal pain  Impression/Recommendations 1. Abdominal pain. Improved with pain medications. CT scan of the abdomen and pelvis indicated underlying endometrial growth with concern for developing malignancy. Per ER physician, radiology felt that this growth is likely causing her abdominal pain. Case was discussed by ER with gynecology, Dr. Elonda Husky. Patient will plan to follow-up as an outpatient with gynecology for further evaluation. No other acute findings were noted on CT scan. CBC was otherwise unremarkable and she is afebrile. Since her pain is managed with pain medications, will plan on further outpatient evaluation. 2. Nausea and vomiting. Likely related to narcotics that were received in the emergency room. Patient did not have any history of the same prior to arrival the hospital. She'll receive Zofran. 3. Hypertension. Continue Norvasc 4. GERD. Continue proton pump inhibitors. 5. Recently diagnosed UTI. Patient is currently on Macrodantin. Will advise to complete her course.  Patient is agreeable to pursue further workup as an outpatient. She's been advised to return to the hospital she has worsening of her pain or any change in her symptoms.  Chief Complaint: Abdominal pain  HPI: [name] is a very pleasant 82 year old female with a past history of GERD and hypertension who presents to the hospital with worsening abdominal pain. Patient reports that her symptoms have been present for the past 2 days. They are located in the suprapubic region/left lower quadrant and progressively gotten worse. She denies any nausea, vomiting prior to arrival. She's not had any diarrhea. She was recently seen at an urgent care and felt to have a possible UTI and was started on  Macrodantin. She denies any chest pain, shortness of breath, fevers. She's not had any falls or recent trauma to her abdomen. After receiving pain medications in the emergency room, she did report improvement of her abdominal pain, but now is endorsing nausea and vomiting.  Review of Systems:  Pertinent positives as per HPI, otherwise negative  Past Medical History  Diagnosis Date  . Hypertension   . GERD (gastroesophageal reflux disease)   . High cholesterol   . SVT (supraventricular tachycardia) ([place])     Noted 09/2011 responsive to cardizem  . NSVT (nonsustained ventricular tachycardia) ([facility])     Short run of monomorphic VT in setting of hypokalemia during 09/2011 hospitalization. EF 60-65% by echo 09/26/11  . Right bundle branch block    Past Surgical History  Procedure Laterality Date  . Cholecystectomy    . Breast lumpectomy      left breast cancer years ago   Social History:  reports that she has never smoked. She does not have any smokeless tobacco history on file. She reports that she does not drink alcohol or use illicit drugs.  Allergies  Allergen Reactions  . Cozaar [Losartan] Swelling    Oral swelling    Family history: reviewed and is non contributory  Prior to Admission medications   Medication Sig Start Date End Date Taking? Authorizing Provider  amLODipine (NORVASC) 5 MG tablet Take 5 mg by mouth every morning.     Yes Historical Provider, MD  aspirin EC 81 MG tablet Take 81 mg by mouth daily.   Yes Historical Provider, MD  fluticasone (FLONASE) 50 MCG/ACT nasal spray Place 2 sprays into the nose as needed for allergies.    Yes Historical Provider, MD  meclizine (  ANTIVERT) 25 MG tablet Take 25 mg by mouth 3 (three) times daily as needed for dizziness.   Yes Historical Provider, MD  nitrofurantoin (MACRODANTIN) 100 MG capsule Take 100 mg by mouth 2 (two) times daily. Starting 08/01/15 x 7 days.   Yes Historical Provider, MD  omeprazole (PRILOSEC) 20 MG capsule Take  20 mg by mouth daily.   Yes Historical Provider, MD  polyethylene glycol (MIRALAX / GLYCOLAX) packet Take 17 g by mouth daily.    Yes Historical Provider, MD  HYDROcodone-acetaminophen (NORCO/VICODIN) 5-325 MG per tablet Take 1 tablet by mouth every 6 (six) hours as needed for severe pain. Patient not taking: Reported on 08/03/2015 04/20/15   Sharlett Iles, MD  meclizine (ANTIVERT) 50 MG tablet Take 0.5 tablets (25 mg total) by mouth 3 (three) times daily as needed for dizziness. Patient not taking: Reported on 04/10/2015 02/07/15   Virgel Manifold, MD  ondansetron (ZOFRAN) 4 MG tablet Take 1 tablet (4 mg total) by mouth every 8 (eight) hours as needed for nausea or vomiting. 08/03/15   Kathie Dike, MD  oxyCODONE-acetaminophen (ROXICET) 5-325 MG tablet Take 1-2 tablets by mouth every 6 (six) hours as needed for severe pain. 08/03/15   Kathie Dike, MD   Physical Exam: Blood pressure 140/64, pulse 69, temperature 98.1 F (36.7 C), temperature source Oral, resp. rate 18, height 5\' 3"  (1.6 m), weight 63.504 kg (140 lb), SpO2 98 %. Filed Vitals:   08/03/15 1330 08/03/15 1430  BP: 157/47 140/64  Pulse:  69  Temp:    Resp:       General:  NAD  Eyes: PERRLA, EOMI  ENT: moist mucous membranes  Neck: supple  Cardiovascular: s1, s2, rrr  Respiratory: cta b  Abdomen: soft, tender in suprapubic region towards LLQ, bs+  Skin: no rashes based on my limited exam  Musculoskeletal: no edema b/l  Psychiatric: normal affect, cooperative with exam  Neurologic: grossly intact, non focal  Labs on Admission:  Basic Metabolic Panel:  Recent Labs Lab 08/03/15 1011  NA 141  K 3.3*  CL 108  CO2 27  GLUCOSE 104*  BUN 12  CREATININE 0.62  CALCIUM 8.4*   Liver Function Tests:  Recent Labs Lab 08/03/15 1011  AST 17  ALT 13*  ALKPHOS 69  BILITOT 1.1  PROT 6.5  ALBUMIN 3.3*    Recent Labs Lab 08/03/15 1011  LIPASE 17   No results for input(s): AMMONIA in the last 168  hours. CBC:  Recent Labs Lab 08/03/15 1011  WBC 5.8  NEUTROABS 4.3  HGB 10.4*  HCT 32.3*  MCV 83.5  PLT 145*   Cardiac Enzymes: No results for input(s): CKTOTAL, CKMB, CKMBINDEX, TROPONINI in the last 168 hours. BNP: Invalid input(s): POCBNP CBG: No results for input(s): GLUCAP in the last 168 hours.  Radiological Exams on Admission: Ct Abdomen Pelvis W Contrast  08/03/2015  CLINICAL DATA:  82 year old female with severe left lower quadrant abdominal pain 2-3 days ago. Recently diagnosed with urinary tract infection. Dark urine. EXAM: CT ABDOMEN AND PELVIS WITH CONTRAST TECHNIQUE: Multidetector CT imaging of the abdomen and pelvis was performed using the standard protocol following bolus administration of intravenous contrast. CONTRAST:  182mL OMNIPAQUE IOHEXOL 300 MG/ML  SOLN COMPARISON:  CT the abdomen and pelvis 04/20/2015. FINDINGS: Lower chest:  Mild cardiomegaly. Hepatobiliary: Status post cholecystectomy. No cystic or solid hepatic lesions. No intra or extrahepatic biliary ductal dilatation. Pancreas: No pancreatic mass. No pancreatic ductal dilatation. No pancreatic or peripancreatic fluid or  inflammatory changes. Spleen: Unremarkable. Adrenals/Urinary Tract: Sub cm low-attenuation lesions in the kidneys bilaterally are too small to definitively characterize, but statistically likely tiny cysts. No other suspicious renal lesions. Bilateral adrenal glands are normal in appearance. No hydroureteronephrosis. Urinary bladder is normal in appearance. Stomach/Bowel: Normal appearance of the stomach. No pathologic dilatation of small bowel or colon. Appendix is not confidently identified and may be surgically absent. Regardless, there are no inflammatory changes adjacent to the cecum to suggest an acute appendicitis at this time. Vascular/Lymphatic: Atherosclerosis throughout the abdominal and pelvic vasculature, without evidence of aneurysm or dissection. No lymphadenopathy noted in the  abdomen or pelvis. Reproductive: In the posterior aspect of the uterine body there is again a well-defined heterogeneously enhancing and partially calcified 5.7 x 4.8 x 6.0 cm lesion, which is most compatible with a large fibroid. Notably, compared to prior examinations there is markedly increasing fluid in the endometrial canal, and today's study now demonstrates some enhancing soft tissue in the endometrial canal that measures approximately 2.3 x 1.2 x 3.3 cm (axial image 67 of series 2 and sagittal image 66 of series 5), highly concerning for endometrial neoplasm. Ovaries are atrophic. Other: No significant volume of ascites.  No pneumoperitoneum. Musculoskeletal: There are no aggressive appearing lytic or blastic lesions noted in the visualized portions of the skeleton. IMPRESSION: 1. No acute findings in the abdomen or pelvis. 2. However, there is evidence of enhancing soft tissue in the endometrial canal, associated with markedly increased endometrial fluid compared to prior examinations; findings that are concerning for endometrial neoplasm. Further nonemergent outpatient evaluation by Gyn-Onc strongly recommended in the near future. 3. Status post cholecystectomy. 4. Extensive atherosclerosis. These results were called by telephone at the time of interpretation on 08/03/2015 at 11:47 am to Dr. Nat Christen, who verbally acknowledged these results. Electronically Signed   By: Vinnie Langton M.D.   On: 08/03/2015 11:49      Time spent: 6mins  Najat Olazabal [facility] Pager [phone]  If 7PM-7AM, please contact night-coverage [email or web address] Password [email or web address] 08/03/2015, 3:22 PM

## 2015-08-03 NOTE — ED Notes (Signed)
Hospitalist at bedside 

## 2015-08-03 NOTE — ED Notes (Signed)
Clear liquid tray given to pt per Dr. Blythe Stanford request.

## 2015-08-05 ENCOUNTER — Telehealth: Payer: Self-pay | Admitting: *Deleted

## 2015-08-05 NOTE — Telephone Encounter (Signed)
Pt Son, Donison, states pt was seen at [facility] ER was told had an endometrial mass which is causing the pt to have a lot of abdominal pain. Pt was given Oxycodone to manage her pain however med causing N/V. Pt given an appt for tomorrow with Dr.Eure.

## 2015-08-06 ENCOUNTER — Encounter: Payer: Self-pay | Admitting: Obstetrics & Gynecology

## 2015-08-06 ENCOUNTER — Other Ambulatory Visit: Payer: Self-pay | Admitting: Obstetrics & Gynecology

## 2015-08-06 ENCOUNTER — Ambulatory Visit (INDEPENDENT_AMBULATORY_CARE_PROVIDER_SITE_OTHER): Payer: Medicare Other | Admitting: Obstetrics & Gynecology

## 2015-08-06 VITALS — BP 178/80 | HR 80 | Wt 146.0 lb

## 2015-08-06 DIAGNOSIS — N813 Complete uterovaginal prolapse: Secondary | ICD-10-CM

## 2015-08-06 DIAGNOSIS — N9489 Other specified conditions associated with female genital organs and menstrual cycle: Secondary | ICD-10-CM | POA: Diagnosis not present

## 2015-08-06 DIAGNOSIS — D259 Leiomyoma of uterus, unspecified: Secondary | ICD-10-CM | POA: Diagnosis not present

## 2015-08-06 DIAGNOSIS — R938 Abnormal findings on diagnostic imaging of other specified body structures: Secondary | ICD-10-CM

## 2015-08-06 DIAGNOSIS — R102 Pelvic and perineal pain: Secondary | ICD-10-CM | POA: Diagnosis not present

## 2015-08-06 DIAGNOSIS — R9389 Abnormal findings on diagnostic imaging of other specified body structures: Secondary | ICD-10-CM

## 2015-08-06 NOTE — Progress Notes (Signed)
Patient ID: [name], female   DOB: 03/18/1921, 82 y.o.   MRN: [id number]      Chief Complaint  Patient presents with  . seem ER    for endometrial mass.    Blood pressure 178/80, pulse 80, weight 146 lb (66.225 kg).  82 y.o. No obstetric history on file. No LMP recorded. Patient is postmenopausal. The current method of family planning is post menopausal status.  Subjective Seen in er for severe pelvic pain, sudden onset Work up revealed an endometrial mass on CT scan No bleeding   Objective Complete uterine procidentia No other midline or adnexal masses palpable  Pertinent ROS No burning with urination, frequency or urgency No nausea, vomiting or diarrhea Nor fever chills or other constitutional symptoms   Labs or studies CT scan from ED reviewed    Impression Diagnoses this Encounter::   ICD-9-CM ICD-10-CM   1. Endometrial mass 625.8 N94.89 US Pelvis Complete     US Transvaginal Non-OB  2. 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    Labs or Scans Ordered: Orders Placed This Encounter  Procedures  . US Pelvis Complete  . US Transvaginal Non-OB    Management:: Fit for milex ring with support #5 with excellent relief of pain immediately  Follow up Return in about 1 week (around 08/13/2015) for GYN sono, Follow up, with Dr Elonda Husky.         All questions were answered.

## 2015-08-07 ENCOUNTER — Encounter: Payer: Self-pay | Admitting: Obstetrics & Gynecology

## 2015-08-08 ENCOUNTER — Encounter: Payer: Self-pay | Admitting: Obstetrics & Gynecology

## 2015-08-14 ENCOUNTER — Encounter: Payer: Self-pay | Admitting: Obstetrics & Gynecology

## 2015-08-14 ENCOUNTER — Ambulatory Visit (INDEPENDENT_AMBULATORY_CARE_PROVIDER_SITE_OTHER): Payer: Medicare Other

## 2015-08-14 ENCOUNTER — Ambulatory Visit (INDEPENDENT_AMBULATORY_CARE_PROVIDER_SITE_OTHER): Payer: Medicare Other | Admitting: Obstetrics & Gynecology

## 2015-08-14 VITALS — BP 160/80 | HR 88 | Wt 147.3 lb

## 2015-08-14 DIAGNOSIS — N9489 Other specified conditions associated with female genital organs and menstrual cycle: Secondary | ICD-10-CM

## 2015-08-14 DIAGNOSIS — N813 Complete uterovaginal prolapse: Secondary | ICD-10-CM

## 2015-08-14 DIAGNOSIS — D259 Leiomyoma of uterus, unspecified: Secondary | ICD-10-CM

## 2015-08-14 NOTE — Progress Notes (Signed)
PELVIC US TA: enlarged heterogenous uterus with a (#1)  6.3 x 4.8 x 6.2 cm post LUS submucosal fibroid and a (#2) calcified post fundal fibroid 1.1 x 1.2 x .8cm, large amount of complex fluid w/in the endometrium, 2.7 x 1.7 x 2.2 cm ? Polyp (no color flow seen),normal ov's bilat,no free fluid seen

## 2015-08-18 ENCOUNTER — Observation Stay (HOSPITAL_COMMUNITY)
Admission: EM | Admit: 2015-08-18 | Discharge: 2015-08-19 | Disposition: A | Discharge status: Home / Self Care | Payer: Medicare Other | Attending: Internal Medicine | Admitting: Internal Medicine

## 2015-08-18 ENCOUNTER — Encounter (HOSPITAL_COMMUNITY): Payer: Self-pay

## 2015-08-18 DIAGNOSIS — I10 Essential (primary) hypertension: Secondary | ICD-10-CM | POA: Diagnosis not present

## 2015-08-18 DIAGNOSIS — I4510 Unspecified right bundle-branch block: Secondary | ICD-10-CM | POA: Insufficient documentation

## 2015-08-18 DIAGNOSIS — R42 Dizziness and giddiness: Secondary | ICD-10-CM | POA: Insufficient documentation

## 2015-08-18 DIAGNOSIS — N390 Urinary tract infection, site not specified: Secondary | ICD-10-CM | POA: Insufficient documentation

## 2015-08-18 DIAGNOSIS — K219 Gastro-esophageal reflux disease without esophagitis: Secondary | ICD-10-CM | POA: Diagnosis not present

## 2015-08-18 DIAGNOSIS — K5900 Constipation, unspecified: Secondary | ICD-10-CM | POA: Insufficient documentation

## 2015-08-18 DIAGNOSIS — E7800 Pure hypercholesterolemia, unspecified: Secondary | ICD-10-CM | POA: Diagnosis not present

## 2015-08-18 DIAGNOSIS — Z7982 Long term (current) use of aspirin: Secondary | ICD-10-CM | POA: Diagnosis not present

## 2015-08-18 DIAGNOSIS — Z888 Allergy status to other drugs, medicaments and biological substances status: Secondary | ICD-10-CM | POA: Insufficient documentation

## 2015-08-18 DIAGNOSIS — I471 Supraventricular tachycardia: Secondary | ICD-10-CM | POA: Diagnosis not present

## 2015-08-18 DIAGNOSIS — R1032 Left lower quadrant pain: Secondary | ICD-10-CM | POA: Insufficient documentation

## 2015-08-18 HISTORY — DX: Dizziness and giddiness: R42

## 2015-08-18 HISTORY — DX: Epigastric pain: R10.13

## 2015-08-18 HISTORY — DX: Pain in unspecified joint: M25.50

## 2015-08-18 HISTORY — DX: Constipation, unspecified: K59.00

## 2015-08-18 LAB — BASIC METABOLIC PANEL
ANION GAP: 8 (ref 5–15)
BUN: 20 mg/dL (ref 6–20)
CALCIUM: 8.9 mg/dL (ref 8.9–10.3)
CHLORIDE: 102 mmol/L (ref 101–111)
CO2: 29 mmol/L (ref 22–32)
Creatinine, Ser: 0.87 mg/dL (ref 0.44–1.00)
GFR calc non Af Amer: 56 mL/min — ABNORMAL LOW (ref >60)
Glucose, Bld: 141 mg/dL — ABNORMAL HIGH (ref 65–99)
Potassium: 3.5 mmol/L (ref 3.5–5.1)
Sodium: 139 mmol/L (ref 135–145)

## 2015-08-18 LAB — CBC
HEMATOCRIT: 40 % (ref 36.0–46.0)
HEMOGLOBIN: 12.8 g/dL (ref 12.0–15.0)
MCH: 26.8 pg (ref 26.0–34.0)
MCHC: 32 g/dL (ref 30.0–36.0)
MCV: 83.7 fL (ref 78.0–100.0)
Platelets: 211 10*3/uL (ref 150–400)
RBC: 4.78 MIL/uL (ref 3.87–5.11)
RDW: 14.7 % (ref 11.5–15.5)
WBC: 7 10*3/uL (ref 4.0–10.5)

## 2015-08-18 LAB — TROPONIN I
TROPONIN I: 0.08 ng/mL — AB (ref <0.031)
Troponin I: 0.07 ng/mL — ABNORMAL HIGH (ref <0.031)

## 2015-08-18 LAB — URINE MICROSCOPIC-ADD ON

## 2015-08-18 LAB — URINALYSIS, ROUTINE W REFLEX MICROSCOPIC
BILIRUBIN URINE: NEGATIVE
GLUCOSE, UA: NEGATIVE mg/dL
Ketones, ur: NEGATIVE mg/dL
Nitrite: NEGATIVE
SPECIFIC GRAVITY, URINE: 1.01 (ref 1.005–1.030)
pH: 6.5 (ref 5.0–8.0)

## 2015-08-18 MED ORDER — SODIUM CHLORIDE 0.9 % IV BOLUS (SEPSIS)
500.0000 mL | Freq: Once | INTRAVENOUS | Status: AC
  Administered 2015-08-18: 500 mL via INTRAVENOUS

## 2015-08-18 MED ORDER — POLYETHYLENE GLYCOL 3350 17 G PO PACK
17.0000 g | PACK | Freq: Every day | ORAL | Status: DC
  Administered 2015-08-19: 17 g via ORAL
  Filled 2015-08-18: qty 1

## 2015-08-18 MED ORDER — ONDANSETRON HCL 4 MG/2ML IJ SOLN: 4.0000 mg | Freq: Four times a day (QID) | INTRAMUSCULAR | Status: DC | PRN

## 2015-08-18 MED ORDER — ACETAMINOPHEN 325 MG PO TABS: 650.0000 mg | ORAL_TABLET | Freq: Four times a day (QID) | ORAL | Status: DC | PRN

## 2015-08-18 MED ORDER — AMLODIPINE BESYLATE 5 MG PO TABS
5.0000 mg | ORAL_TABLET | Freq: Every day | ORAL | Status: DC
  Administered 2015-08-19: 5 mg via ORAL
  Filled 2015-08-18: qty 1

## 2015-08-18 MED ORDER — ACETAMINOPHEN 650 MG RE SUPP: 650.0000 mg | Freq: Four times a day (QID) | RECTAL | Status: DC | PRN

## 2015-08-18 MED ORDER — PANTOPRAZOLE SODIUM 40 MG PO TBEC
40.0000 mg | DELAYED_RELEASE_TABLET | Freq: Every day | ORAL | Status: DC
  Administered 2015-08-19: 40 mg via ORAL
  Filled 2015-08-18: qty 1

## 2015-08-18 MED ORDER — METOPROLOL TARTRATE 25 MG PO TABS
25.0000 mg | ORAL_TABLET | Freq: Once | ORAL | Status: AC
  Administered 2015-08-18: 25 mg via ORAL
  Filled 2015-08-18: qty 1

## 2015-08-18 MED ORDER — SODIUM CHLORIDE 0.9 % IV SOLN
INTRAVENOUS | Status: DC
  Administered 2015-08-18: 22:00:00 via INTRAVENOUS

## 2015-08-18 MED ORDER — METOPROLOL TARTRATE 25 MG PO TABS
25.0000 mg | ORAL_TABLET | Freq: Two times a day (BID) | ORAL | Status: DC
  Administered 2015-08-19: 25 mg via ORAL
  Filled 2015-08-18: qty 1

## 2015-08-18 MED ORDER — ASPIRIN EC 81 MG PO TBEC
81.0000 mg | DELAYED_RELEASE_TABLET | Freq: Every day | ORAL | Status: DC
  Administered 2015-08-19: 81 mg via ORAL
  Filled 2015-08-18: qty 1

## 2015-08-18 MED ORDER — ONDANSETRON HCL 4 MG PO TABS: 4.0000 mg | ORAL_TABLET | Freq: Four times a day (QID) | ORAL | Status: DC | PRN

## 2015-08-18 MED ORDER — ENOXAPARIN SODIUM 40 MG/0.4ML ~~LOC~~ SOLN
40.0000 mg | SUBCUTANEOUS | Status: DC
  Administered 2015-08-18: 40 mg via SUBCUTANEOUS
  Filled 2015-08-18: qty 0.4

## 2015-08-18 NOTE — ED Provider Notes (Signed)
CSN: [id number]     Arrival date & time 08/18/15  1637 History   First MD Initiated Contact with Patient 08/18/15 1647     Chief Complaint  Patient presents with  . Dizziness   (Consider location/radiation/quality/duration/timing/severity/associated sxs/prior Treatment) HPI 82 y.o. female with a hx of HTN, SVT, and Endometrial Malignancy, presents to the Emergency Department today complaining of dizziness/diaphoresis. Pt notes that she has been feeling dizzy for the past 3 days and describes the sensation as lightheadedness. States that it occurs when she stands up from a seated or lying position. No syncope. Given Meclizine on 1/12, which had little effect. Saw her PCP for continued complaint today. Was sent by EMS due to diaphoretic episode. Currently no pain, N/V, chest pain, dyspnea, headache. No hx of ACS. She was seen on 08/03/15 for abdominal pain and was found to have endometrial malignancy.    Past Medical History  Diagnosis Date  . Hypertension   . GERD (gastroesophageal reflux disease)   . High cholesterol   . SVT (supraventricular tachycardia) ([place])     Noted 09/2011 responsive to cardizem  . NSVT (nonsustained ventricular tachycardia) ([facility])     Short run of monomorphic VT in setting of hypokalemia during 09/2011 hospitalization. EF 60-65% by echo 09/26/11  . Right bundle branch block   . Constipation   . Vertigo   . Dyspepsia   . Arthralgia    Past Surgical History  Procedure Laterality Date  . Cholecystectomy    . Breast lumpectomy      left breast cancer years ago   No family history on file. Social History  Substance Use Topics  . Smoking status: Never Smoker   . Smokeless tobacco: None  . Alcohol Use: No   OB History    No data available     Review of Systems 10 Systems reviewed and all are negative for acute change except as noted in the HPI.  Allergies  Cozaar  Home Medications   Prior to Admission medications   Medication Sig Start Date End Date  Taking? Authorizing Provider  amLODipine (NORVASC) 5 MG tablet Take 5 mg by mouth every morning.     Yes Historical Provider, MD  aspirin EC 81 MG tablet Take 81 mg by mouth daily.   Yes Historical Provider, MD  fluticasone (FLONASE) 50 MCG/ACT nasal spray Place 2 sprays into the nose as needed for allergies. Reported on 08/06/2015   Yes Historical Provider, MD  meclizine (ANTIVERT) 25 MG tablet Take 12.5-25 mg by mouth 3 (three) times daily as needed for dizziness. Reported on 08/14/2015   Yes Historical Provider, MD  omeprazole (PRILOSEC) 20 MG capsule Take 20 mg by mouth daily.    Historical Provider, MD  ondansetron (ZOFRAN) 4 MG tablet TAKE 1 TABLET BY MOUTH EVERY 8 HOURS AS NEEDED FOR NAUSEA/VOMITING 08/03/15   Historical Provider, MD  oxyCODONE-acetaminophen (PERCOCET/ROXICET) 5-325 MG tablet TAKE 1 TO 2 TABLETS BY MOUTH EVERY 6 HOURS AS NEEDED FOR SEVERE PAIN 08/03/15   Historical Provider, MD  polyethylene glycol (MIRALAX / GLYCOLAX) packet Take 17 g by mouth daily.     Historical Provider, MD   BP 173/79 mmHg  Pulse 97  Temp(Src) 98.2 F (36.8 C) (Oral)  Resp 14  Ht 5\' 6"  (1.676 m)  Wt 66.225 kg  BMI 23.58 kg/m2  SpO2 100%   Physical Exam  Constitutional: She is oriented to person, place, and time. She appears well-developed and well-nourished.  HENT:  Head: Normocephalic and atraumatic.  Eyes: EOM are normal.  Neck: Normal range of motion.  Cardiovascular: Normal rate, regular rhythm, S1 normal, S2 normal, normal heart sounds, intact distal pulses and normal pulses.   Pulmonary/Chest: Effort normal and breath sounds normal.  Abdominal: Soft. Normal appearance. There is no tenderness.  Musculoskeletal: Normal range of motion.  Neurological: She is alert and oriented to person, place, and time. She has normal strength. No cranial nerve deficit or sensory deficit.  Skin: Skin is warm and dry.  Psychiatric: She has a normal mood and affect. Her behavior is normal. Thought content  normal.  Nursing note and vitals reviewed.  ED Course  Procedures (including critical care time) Labs Review Labs Reviewed  URINALYSIS, ROUTINE W REFLEX MICROSCOPIC (NOT AT [facility]) - Abnormal; Notable for the following:    APPearance HAZY (*)    Hgb urine dipstick TRACE (*)    Protein, ur TRACE (*)    Leukocytes, UA MODERATE (*)    All other components within normal limits  BASIC METABOLIC PANEL - Abnormal; Notable for the following:    Glucose, Bld 141 (*)    GFR calc non Af Amer 56 (*)    All other components within normal limits  TROPONIN I - Abnormal; Notable for the following:    Troponin I 0.07 (*)    All other components within normal limits  URINE MICROSCOPIC-ADD ON - Abnormal; Notable for the following:    Squamous Epithelial / LPF 6-30 (*)    Bacteria, UA MANY (*)    All other components within normal limits  CBC   Imaging Review No results found. I have personally reviewed and evaluated these images and lab results as part of my medical decision-making.   EKG Interpretation   Date/Time:  Monday August 18 2015 16:46:44 EST Ventricular Rate:  98 PR Interval:  200 QRS Duration: 135 QT Interval:  386 QTC Calculation: 493 R Axis:   -44 Text Interpretation:  Sinus rhythm RBBB and LAFB ST elevation, consider  inferior injury since last tracing no significant change Confirmed by  MILLER  MD, BRIAN ([id number]) on 08/18/2015 5:06:20 PM     MDM  I have reviewed relevant laboratory values. I have reviewed relevant imaging studies. I personally interpreted the relevant EKG. I have reviewed the relevant previous healthcare records. I have reviewed EMS Documentation. I obtained HPI from historian. Patient discussed with supervising physician  ED Course: NS Bolus 500 CBC, BMP, Trop, UA Orthostatics  Assessment: 81y F with hx HTN, GERD, and endometrial malignancy presents after 3 days of dizziness. No symptoms of CP, Dyspnea, Abdominal Pain, H/A. No hx ACS. No neurological  findings on exam. Treated with meclizine in the past with no relief. Based on hx, dizzy spells occur when patient is standing from a seated/lying position. Episodes of regular SVT while in ED. Responded with vagal maneuvers. Trop 0.07, CBC unremarkable, BMP unremarkable. Given metoprolol while in ED for management of SVT. Repeat EKG after metoprolol brought NSR with RBBB. Consult to hospitalist as she is not currently on any rate control medications and is living by herself, with high risk for syncope will need observational admission.   Disposition/Plan:  Admit to Medicine Observation Pt acknowledges and agrees with plan  Supervising Physician [name], MD   Final diagnoses:  Dizzy  SVT (supraventricular tachycardia) [facility])    Shary Decamp, PA-C 08/18/15 2009  [name], MD 08/19/15 [phone]

## 2015-08-18 NOTE — ED Provider Notes (Signed)
The patient is a 82 year old female, according to the medical record there is a history of nonsustained ventricular tachycardia as well as a history of supraventricular tachycardia treated successfully with calcium channel blockers. She presents to the hospital today with a complaint of  near-syncope. She states that she is having intermittent episodes where she feels extremely lightheaded, as if she is going to pass out, she denies vertigo, on exam she has no focal neurologic deficits including weakness of her 4 extremities, normal speech, normal memory, cranial nerves III through XII appear normal as well. She has a rapid heartbeat on my initial exam after I was called in to see the patient for a acute onset of tachycardia. Her rhythm was a regular supraventricular tachycardia. She has a known bundle branch block at baseline and thus I believe it was SVT with aberrancy. She successfully converted to a sinus tachycardia with vagal maneuvers however she did have some recurrent spells of SVT that required ongoing vagal maneuvers. She will be given a low-dose beta blocker, I would strongly consider keeping her in the hospital at this time she is not currently on any rate control medications and is living by herself, she is very fragile, there is a high risk for syncope and collapse, will need observational admission and likely cardiology consultation. Low-dose beta blocker ordered.   EKG Interpretation  Date/Time:  Monday August 18 2015 16:46:44 EST Ventricular Rate:  98 PR Interval:  200 QRS Duration: 135 QT Interval:  386 QTC Calculation: 493 R Axis:   -44 Text Interpretation:  Sinus rhythm RBBB and LAFB ST elevation, consider inferior injury since last tracing no significant change Confirmed by Kelven Flater  MD, [place] ([id number]) on 08/18/2015 5:06:20 PM       EKG Interpretation  Date/Time:  Monday August 18 2015 17:56:15 EST Ventricular Rate:  167 PR Interval:  104 QRS Duration: 129 QT  Interval:  327 QTC Calculation: 545 R Axis:   -31 Text Interpretation:  Wide-QRS tachycardia Right bundle branch block Supraventricular tachycardia with aberrancy Confirmed by Cleveland Paiz  MD, Meliya Mcconahy ([id number]) on 08/19/2015 12:17:54 AM      Pt admitted for ongoing arrhythmia   Medical screening examination/treatment/procedure(s) were conducted as a shared visit with non-physician practitioner(s) and myself.  I personally evaluated the patient during the encounter.  Clinical Impression:   Final diagnoses:  Dizzy  SVT (supraventricular tachycardia) ([facility])         [name], MD 08/19/15 [id number]

## 2015-08-18 NOTE — H&P (Signed)
PCP:   Sherrie Mustache, MD   Chief Complaint:  Dizziness  HPI: 82 year old female who   has a past medical history of Hypertension; GERD (gastroesophageal reflux disease); High cholesterol; SVT (supraventricular tachycardia) ([place]); NSVT (nonsustained ventricular tachycardia) ([place]); Right bundle branch block; Constipation; Vertigo; Dyspepsia; and Arthralgia. Today was sent to the ED from Dr. Murrell Redden office for chief complaint of dizziness for past 3 days. Patient also has been diaphoretic, denies chest pain or shortness of breath. In the ED she was found to be in SVT with heart rate 160s, vagal maneuver were done by the ED physician and patient come back to normal sinus rhythm. Patient was given 1 dose of po metoprolol 25 mg in the ED. At this time she denies any chest pain, no shortness of breath. No dizziness. No history of nausea vomiting or diarrhea. No fever dysuria. Patient does not have previous history of SVT. Doesn't drink coffee or smoke cigarettes.  Allergies:   Allergies  Allergen Reactions  . Cozaar [Losartan] Swelling    Oral swelling       Past Medical History  Diagnosis Date  . Hypertension   . GERD (gastroesophageal reflux disease)   . High cholesterol   . SVT (supraventricular tachycardia) ([place])     Noted 09/2011 responsive to cardizem  . NSVT (nonsustained ventricular tachycardia) (HCC)     Short run of monomorphic VT in setting of hypokalemia during 09/2011 hospitalization. EF 60-65% by echo 09/26/11  . Right bundle branch block   . Constipation   . Vertigo   . Dyspepsia   . Arthralgia     Past Surgical History  Procedure Laterality Date  . Cholecystectomy    . Breast lumpectomy      left breast cancer years ago    Prior to Admission medications   Medication Sig Start Date End Date Taking? Authorizing Provider  amLODipine (NORVASC) 5 MG tablet Take 5 mg by mouth every morning.     Yes Historical Provider, MD  aspirin EC 81 MG tablet Take 81  mg by mouth daily.   Yes Historical Provider, MD  fluticasone (FLONASE) 50 MCG/ACT nasal spray Place 2 sprays into the nose as needed for allergies. Reported on 08/06/2015   Yes Historical Provider, MD  meclizine (ANTIVERT) 25 MG tablet Take 12.5-25 mg by mouth 3 (three) times daily as needed for dizziness. Reported on 08/14/2015   Yes Historical Provider, MD  omeprazole (PRILOSEC) 20 MG capsule Take 20 mg by mouth daily.    Historical Provider, MD  ondansetron (ZOFRAN) 4 MG tablet TAKE 1 TABLET BY MOUTH EVERY 8 HOURS AS NEEDED FOR NAUSEA/VOMITING 08/03/15   Historical Provider, MD  oxyCODONE-acetaminophen (PERCOCET/ROXICET) 5-325 MG tablet TAKE 1 TO 2 TABLETS BY MOUTH EVERY 6 HOURS AS NEEDED FOR SEVERE PAIN 08/03/15   Historical Provider, MD  polyethylene glycol (MIRALAX / GLYCOLAX) packet Take 17 g by mouth daily.     Historical Provider, MD    Social History:  reports that she has never smoked. She does not have any smokeless tobacco history on file. She reports that she does not drink alcohol or use illicit drugs.    Filed Weights   08/18/15 1647  Weight: 66.225 kg (146 lb)    All the positives are listed in BOLD  Review of Systems:  HEENT: Headache, blurred vision, runny nose, sore throat, dizziness Neck: Hypothyroidism, hyperthyroidism,,lymphadenopathy Chest : Shortness of breath, history of COPD, Asthma Heart : Chest pain, history of coronary arterey disease GI:  Nausea, vomiting, diarrhea, constipation, GERD GU: Dysuria, urgency, frequency of urination, hematuria Neuro: Stroke, seizures, syncope Psych: Depression, anxiety, hallucinations   Physical Exam: Blood pressure 161/78, pulse 75, temperature 98.2 F (36.8 C), temperature source Oral, resp. rate 14, height 5\' 6"  (1.676 m), weight 66.225 kg (146 lb), SpO2 99 %. Constitutional:   Patient is a well-developed and well-nourished *female in no acute distress and cooperative with exam. Head: Normocephalic and atraumatic Mouth:  Mucus membranes moist Eyes: PERRL, EOMI, conjunctivae normal Neck: Supple, No Thyromegaly Cardiovascular: RRR, S1 normal, S2 normal Pulmonary/Chest: CTAB, no wheezes, rales, or rhonchi Abdominal: Soft. Non-tender, non-distended, bowel sounds are normal, no masses, organomegaly, or guarding present.  Neurological: A&O x3, Strength is normal and symmetric bilaterally, cranial nerve II-XII are grossly intact, no focal motor deficit, sensory intact to light touch bilaterally.  Extremities : No Cyanosis, Clubbing or Edema  Labs on Admission:  Basic Metabolic Panel:  Recent Labs Lab 08/18/15 1717  NA 139  K 3.5  CL 102  CO2 29  GLUCOSE 141*  BUN 20  CREATININE 0.87  CALCIUM 8.9   will CBC:  Recent Labs Lab 08/18/15 1717  WBC 7.0  HGB 12.8  HCT 40.0  MCV 83.7  PLT 211   Cardiac Enzymes:  Recent Labs Lab 08/18/15 1717  TROPONINI 0.07*     EKG: Independently reviewed. SVT   Assessment/Plan Active Problems:   Essential hypertension, benign   GERD (gastroesophageal reflux disease)   Dizzy   SVT (supraventricular tachycardia) (HCC)  SVT Patient back in normal rhythm, will admit the patient under tele Continue metoprolol 25 mg twice a day  Mild elevation of troponin Patient has mild elevation of troponin 0.07, likely from demand ischemia from SVT Will cycle cardiac enzymes every 6 hours 3. No chest pain at this time  Hypertension Blood pressure mildly elevated Continue home dose of amlodipine 10 mg daily Also added metoprolol 25 milligram by mouth twice a day  History of GERD Continue pantoprazole  DVT prophylaxis Lovenox   Code status: Full code  Family discussion: Admission, patients condition and plan of care including tests being ordered have been discussed with the patient and her son on phone   who indicate understanding and agree with the plan and Code Status.   Time Spent on Admission: 55 min  [facility] Pager:  [phone] 08/18/2015, 7:45 PM  If 7PM-7AM, please contact night-coverage  [email or web address]  Password [email or web address]

## 2015-08-18 NOTE — ED Notes (Signed)
In room to scan IVF.  Pt heart rate SVT 169.  EKG done and given to Dr. Sabra Heck.  MD in room to see pt.  Vagal maneuvers done and heart rate slowed down to 90-105 NSR.

## 2015-08-18 NOTE — ED Notes (Signed)
EMS reports pt was sent here from Dr. Murrell Redden office for c/o dizziness x 3days.  EMS reports pt has an abd problem that they were unable to treat due to patients age but pt didn't know what it was.  Abd is distended, more than usual per pt.   Today at dr's office, pt was c/o dizziness and was diaphoretic.   Pt presently alert and oriented.  Pt says was hurting all over earlier today but denies pain at this time.

## 2015-08-19 ENCOUNTER — Observation Stay (HOSPITAL_BASED_OUTPATIENT_CLINIC_OR_DEPARTMENT_OTHER): Payer: Medicare Other

## 2015-08-19 DIAGNOSIS — I471 Supraventricular tachycardia: Secondary | ICD-10-CM

## 2015-08-19 DIAGNOSIS — K219 Gastro-esophageal reflux disease without esophagitis: Secondary | ICD-10-CM | POA: Diagnosis not present

## 2015-08-19 DIAGNOSIS — I10 Essential (primary) hypertension: Secondary | ICD-10-CM | POA: Diagnosis not present

## 2015-08-19 DIAGNOSIS — I340 Nonrheumatic mitral (valve) insufficiency: Secondary | ICD-10-CM

## 2015-08-19 LAB — COMPREHENSIVE METABOLIC PANEL
ALBUMIN: 3.3 g/dL — AB (ref 3.5–5.0)
ALT: 23 U/L (ref 14–54)
ANION GAP: 8 (ref 5–15)
AST: 20 U/L (ref 15–41)
Alkaline Phosphatase: 66 U/L (ref 38–126)
BILIRUBIN TOTAL: 0.5 mg/dL (ref 0.3–1.2)
BUN: 20 mg/dL (ref 6–20)
CO2: 28 mmol/L (ref 22–32)
Calcium: 8.6 mg/dL — ABNORMAL LOW (ref 8.9–10.3)
Chloride: 107 mmol/L (ref 101–111)
Creatinine, Ser: 0.78 mg/dL (ref 0.44–1.00)
GFR calc Af Amer: >60 mL/min (ref >60)
GFR calc non Af Amer: >60 mL/min (ref >60)
GLUCOSE: 104 mg/dL — AB (ref 65–99)
POTASSIUM: 3.5 mmol/L (ref 3.5–5.1)
SODIUM: 143 mmol/L (ref 135–145)
TOTAL PROTEIN: 6.5 g/dL (ref 6.5–8.1)

## 2015-08-19 LAB — CBC
HEMATOCRIT: 35.9 % — AB (ref 36.0–46.0)
Hemoglobin: 11.6 g/dL — ABNORMAL LOW (ref 12.0–15.0)
MCH: 26.9 pg (ref 26.0–34.0)
MCHC: 32.3 g/dL (ref 30.0–36.0)
MCV: 83.1 fL (ref 78.0–100.0)
Platelets: 205 10*3/uL (ref 150–400)
RBC: 4.32 MIL/uL (ref 3.87–5.11)
RDW: 14.7 % (ref 11.5–15.5)
WBC: 7.6 10*3/uL (ref 4.0–10.5)

## 2015-08-19 LAB — TROPONIN I
TROPONIN I: 0.08 ng/mL — AB (ref <0.031)
TROPONIN I: 0.07 ng/mL — AB (ref <0.031)

## 2015-08-19 LAB — TSH: TSH: 1.741 u[IU]/mL (ref 0.350–4.500)

## 2015-08-19 LAB — MRSA PCR SCREENING: MRSA BY PCR: NEGATIVE

## 2015-08-19 MED ORDER — METOPROLOL TARTRATE 25 MG PO TABS: 25.0000 mg | ORAL_TABLET | Freq: Two times a day (BID) | ORAL | Status: DC

## 2015-08-19 NOTE — Progress Notes (Signed)
D.c instructions reviewed with patient and family. Verbalized understanding. Pt dc'd to home with family. 

## 2015-08-19 NOTE — Care Management Note (Signed)
Case Management Note  Patient Details  Name: [name] MRN: [id number] Date of Birth: 03/18/1921  Subjective/Objective:                  Pt admitted with vertigo. Pt is from home, lives alone and is ind with ADL's. Pt has cane and walker if she needs them but does not use them at baseline. Pt has a cell phone and Life Alert. Pt plans to have a friend or family member stay with her for a few days. Pt has requests [place] RN to come assess after DC. Pt has chosen [facility] for [facility] agency.   Action/Plan: [name], of [facility], made aware of referral and will obtain pt info from chart. Pt aware HH has 48 hours to initiate services after DC. No further CM needs at this time.   Expected Discharge Date:  08/21/15               Expected Discharge Plan:  [place]  In-House Referral:  NA  Discharge planning Services  CM Consult  Post Acute Care Choice:  Home Health Choice offered to:  Patient  DME Arranged:    DME Agency:     HH Arranged:  RN [place] Agency:  [place]  Status of Service:  Completed, signed off  Medicare Important Message Given:    Date Medicare IM Given:    Medicare IM give by:    Date Additional Medicare IM Given:    Additional Medicare Important Message give by:     If discussed at [place] of Stay Meetings, dates discussed:    Additional Comments:  Sherald Barge, RN 08/19/2015, 4:29 PM

## 2015-08-19 NOTE — Discharge Summary (Signed)
Physician Discharge Summary  RANNAH HODGKINS [id number] DOB: 03/18/1921 DOA: 08/18/2015  PCP: Sherrie Mustache, MD  Admit date: 08/18/2015 Discharge date: 08/19/2015  Time spent: 35 minutes  Recommendations for Outpatient Follow-up:  1. Follow up with PCP within 1-2 weeks for medication management.   2. Follow up TSH results.    Discharge Diagnoses:  Active Problems:   Essential hypertension, benign   GERD (gastroesophageal reflux disease)   Dizzy   SVT (supraventricular tachycardia) (HCC)   Discharge Condition: Improved   Diet recommendation: Heart healthy   Filed Weights   08/18/15 1647 08/18/15 2139 08/19/15 0500  Weight: 66.225 kg (146 lb) 65 kg (143 lb 4.8 oz) 65 kg (143 lb 4.8 oz)    History of present illness:  82 yof with PMH HTN, GERD, SVT presented from her PCP with complaints of dizziness and noted to diaphoretic. In the ED she was found to be in SVT with heart rate in the 160's. Patient admitted for further evaluation and monitoring on the telemetry.   Hospital Course:  Patient found to be in SVT with heart rate in the 160s on admission. While in the ED the vagal maneuver was done and patient returned to normal sinus rhythm. She was also given 1 dose PO metoprolol with resolution of chest pain. Heart rate has been stable since admission on metoprolol. On discharge patient denies any further chest pain. Continue metoprolol 25mg  BID. ECHO results as below. TSH has been checked and can be followed up by PCP.  1. Mild elevation of troponin, likely from demand ischemia from SVT. Serial troponin were negative and she denied any further chest pain.  2. HTN, continued home dose of amlodipine 10mg  daily and added metoprolol 25mg  BID.  3. GERD, continue PPI  Procedures:  None   Consultations:  None  Discharge Exam: Filed Vitals:   08/19/15 0400 08/19/15 0500  BP: 168/69   Pulse: 74 67  Temp: 97.8 F (36.6 C)   Resp: 16 16    General: NAD, looks  comfortable Cardiovascular: RRR, S1, S2  Respiratory: clear bilaterally, No wheezing, rales or rhonchi Abdomen: soft, non tender, no distention , bowel sounds normal Musculoskeletal: No edema b/l  Discharge Instructions    Current Discharge Medication List    CONTINUE these medications which have NOT CHANGED   Details  amLODipine (NORVASC) 5 MG tablet Take 5 mg by mouth every morning.      aspirin EC 81 MG tablet Take 81 mg by mouth daily.    fluticasone (FLONASE) 50 MCG/ACT nasal spray Place 2 sprays into the nose as needed for allergies. Reported on 08/06/2015    meclizine (ANTIVERT) 25 MG tablet Take 12.5-25 mg by mouth 3 (three) times daily as needed for dizziness. Reported on 08/14/2015    omeprazole (PRILOSEC) 20 MG capsule Take 20 mg by mouth daily.    polyethylene glycol (MIRALAX / GLYCOLAX) packet Take 17 g by mouth daily.        Allergies  Allergen Reactions  . Cozaar [Losartan] Swelling    Oral swelling       The results of significant diagnostics from this hospitalization (including imaging, microbiology, ancillary and laboratory) are listed below for reference.    Significant Diagnostic Studies: US Pelvis Complete  08/17/2015  GYNECOLOGIC SONOGRAM DIANIRA RUDD is a 82 y.o.post menopausal women,she is here for a pelvic sonogram for pelvic pain. Uterus  13.7 x 7.4 x 8.8 cm, enlarged heterogenous uterus with a (#1)  6.3 x 4.8 x 6.2 cm post LUS submucosal fibroid and a (#2) calcified post fundal                                        fibroid 1.1 x 1.2 x .8cm Endometrium          3.2 mm ?, large amount of complex fluid w/in the endometrium, 2.7 x 1.7 x 2.2 cm ? Polyp (no color flow seen) Right ovary             2.4 x 1.8 x 2.3 cm, wnl Left ovary                1.6 x 1.3 x 1.5 cm, wnl Technician Comments: PELVIC US TA: enlarged heterogenous uterus with a (#1)  6.3 x 4.8 x 6.2 cm post LUS submucosal fibroid and a (#2) calcified post fundal fibroid  1.1 x 1.2 x .8cm, large amount of complex fluid w/in the endometrium, 2.7 x 1.7 x 2.2 cm ? Polyp (no color flow seen),normal ov's bilat,no free fluid seen Silver Huguenin 08/14/2015 1:57 PM Clinical Impression and recommendations: I have reviewed the sonogram results above. Combined with the patient's current clinical course, below are my impressions and any appropriate recommendations for management based on the sonographic findings: 1. A large amount of endometrial fluid noted , >8 cm in longest axis, with a small 3 cm polypoid structure along posterior uterine endometrial surface, without documented vascularity, likely representing endometrial polyp. 2 Small atrophic ovaries bilateral. 3. Depending on patient clinical status and desire for care, I recommend consideration of hysteroscopy and polyp excision. 4  Pt may require a brief regimen of vaginal estrogen, if procedure being considered, to overcome suspected cervical stenosis. FERGUSON,JOHN V  Ct Abdomen Pelvis W Contrast  08/03/2015  CLINICAL DATA:  82 year old female with severe left lower quadrant abdominal pain 2-3 days ago. Recently diagnosed with urinary tract infection. Dark urine. EXAM: CT ABDOMEN AND PELVIS WITH CONTRAST TECHNIQUE: Multidetector CT imaging of the abdomen and pelvis was performed using the standard protocol following bolus administration of intravenous contrast. CONTRAST:  14mL OMNIPAQUE IOHEXOL 300 MG/ML  SOLN COMPARISON:  CT the abdomen and pelvis 04/20/2015. FINDINGS: Lower chest:  Mild cardiomegaly. Hepatobiliary: Status post cholecystectomy. No cystic or solid hepatic lesions. No intra or extrahepatic biliary ductal dilatation. Pancreas: No pancreatic mass. No pancreatic ductal dilatation. No pancreatic or peripancreatic fluid or inflammatory changes. Spleen: Unremarkable. Adrenals/Urinary Tract: Sub cm low-attenuation lesions in the kidneys bilaterally are too small to definitively characterize, but statistically likely tiny  cysts. No other suspicious renal lesions. Bilateral adrenal glands are normal in appearance. No hydroureteronephrosis. Urinary bladder is normal in appearance. Stomach/Bowel: Normal appearance of the stomach. No pathologic dilatation of small bowel or colon. Appendix is not confidently identified and may be surgically absent. Regardless, there are no inflammatory changes adjacent to the cecum to suggest an acute appendicitis at this time. Vascular/Lymphatic: Atherosclerosis throughout the abdominal and pelvic vasculature, without evidence of aneurysm or dissection. No lymphadenopathy noted in the abdomen or pelvis. Reproductive: In the posterior aspect of the uterine body there is again a well-defined heterogeneously enhancing and partially calcified 5.7 x 4.8 x 6.0 cm lesion, which is most compatible with a large fibroid. Notably, compared to prior examinations there is markedly increasing fluid in the endometrial  canal, and today's study now demonstrates some enhancing soft tissue in the endometrial canal that measures approximately 2.3 x 1.2 x 3.3 cm (axial image 67 of series 2 and sagittal image 66 of series 5), highly concerning for endometrial neoplasm. Ovaries are atrophic. Other: No significant volume of ascites.  No pneumoperitoneum. Musculoskeletal: There are no aggressive appearing lytic or blastic lesions noted in the visualized portions of the skeleton. IMPRESSION: 1. No acute findings in the abdomen or pelvis. 2. However, there is evidence of enhancing soft tissue in the endometrial canal, associated with markedly increased endometrial fluid compared to prior examinations; findings that are concerning for endometrial neoplasm. Further nonemergent outpatient evaluation by Gyn-Onc strongly recommended in the near future. 3. Status post cholecystectomy. 4. Extensive atherosclerosis. These results were called by telephone at the time of interpretation on 08/03/2015 at 11:47 am to Dr. Nat Christen, who verbally  acknowledged these results. Electronically Signed   By: Vinnie Langton M.D.   On: 08/03/2015 11:49    Microbiology: Recent Results (from the past 240 hour(s))  MRSA PCR Screening     Status: None   Collection Time: 08/18/15 10:00 PM  Result Value Ref Range Status   MRSA by PCR NEGATIVE NEGATIVE Final    Comment:        The GeneXpert MRSA Assay (FDA approved for NASAL specimens only), is one component of a comprehensive MRSA colonization surveillance program. It is not intended to diagnose MRSA infection nor to guide or monitor treatment for MRSA infections.      Labs: Basic Metabolic Panel:  Recent Labs Lab 08/18/15 1717 08/19/15 0356  NA 139 143  K 3.5 3.5  CL 102 107  CO2 29 28  GLUCOSE 141* 104*  BUN 20 20  CREATININE 0.87 0.78  CALCIUM 8.9 8.6*   Liver Function Tests:  Recent Labs Lab 08/19/15 0356  AST 20  ALT 23  ALKPHOS 66  BILITOT 0.5  PROT 6.5  ALBUMIN 3.3*   CBC:  Recent Labs Lab 08/18/15 1717 08/19/15 0356  WBC 7.0 7.6  HGB 12.8 11.6*  HCT 40.0 35.9*  MCV 83.7 83.1  PLT 211 205   Cardiac Enzymes:  Recent Labs Lab 08/18/15 1717 08/18/15 2154 08/19/15 0356  TROPONINI 0.07* 0.08* 0.08*       Signed:  Kathie Dike, MD  [facility] 08/19/2015, 6:51 AM    By signing my name below, I, Rennis Harding, attest that this documentation has been prepared under the direction and in the presence of Kathie Dike, MD. Electronically signed: Rennis Harding, Scribe. 08/19/2015 9:35am   I, Dr. Kathie Dike, personally performed the services described in this documentaiton. All medical record entries made by the scribe were at my direction and in my presence. I have reviewed the chart and agree that the record reflects my personal performance and is accurate and complete  Kathie Dike, MD, 08/19/2015 3:33 PM

## 2015-09-18 ENCOUNTER — Encounter: Payer: Self-pay | Admitting: Obstetrics & Gynecology

## 2015-09-18 ENCOUNTER — Ambulatory Visit (INDEPENDENT_AMBULATORY_CARE_PROVIDER_SITE_OTHER): Payer: Medicare Other | Admitting: Obstetrics & Gynecology

## 2015-09-18 VITALS — BP 120/60 | HR 76 | Wt 142.0 lb

## 2015-09-18 DIAGNOSIS — N813 Complete uterovaginal prolapse: Secondary | ICD-10-CM

## 2015-09-18 NOTE — Progress Notes (Signed)
Patient ID: [name], female   DOB: 03/18/1921, 82 y.o.   MRN: [id number] Chief Complaint  Patient presents with  . Follow-up    clean/ check pessary    Blood pressure 120/60, pulse 76, weight 142 lb (64.411 kg).  [name] presents today for routine follow up related to her pessary.   She uses a milex ring with suport #4 She reports no vaginal discharge or vaginal bleeding.  Exam reveals no undue vaginal mucosal pressure of breakdown, no discharge and no vaginal bleeding.  The pessary is removed, cleaned and replaced without difficulty.    MARKISHA OSTENSON will be sen back in 3 months for continued follow up.  @[facility] @ 09/18/2015 2:55 PM

## 2015-10-01 NOTE — Progress Notes (Signed)
Patient ID: [name], female   DOB: 03/18/1921, 82 y.o.   MRN: [id number] Follow up appointment for results  Chief Complaint  Patient presents with  . Follow-up    ultrasound today    Blood pressure 160/80, pulse 88, weight 147 lb 4.8 oz (66.815 kg).  GYNECOLOGIC SONOGRAM   [name] is a 82 y.o.post menopausal women,she is here for a pelvic sonogram for pelvic pain.  Uterus 13.7 x 7.4 x 8.8 cm, enlarged heterogenous uterus with a (#1) 6.3 x 4.8 x 6.2 cm post LUS submucosal fibroid and a (#2) calcified post fundal  fibroid 1.1 x 1.2 x .8cm  Endometrium 3.2 mm ?, large amount of complex fluid w/in the endometrium, 2.7 x 1.7 x 2.2 cm ? Polyp (no color flow seen)  Right ovary 2.4 x 1.8 x 2.3 cm, wnl  Left ovary 1.6 x 1.3 x 1.5 cm, wnl   Technician Comments:   PELVIC US TA: enlarged heterogenous uterus with a (#1) 6.3 x 4.8 x 6.2 cm post LUS submucosal fibroid and a (#2) calcified post fundal fibroid 1.1 x 1.2 x .8cm, large amount of complex fluid w/in the endometrium, 2.7 x 1.7 x 2.2 cm ? Polyp (no color flow seen),normal ov's bilat,no free fluid seen   Silver Huguenin 08/14/2015 1:57 PM  Clinical Impression and recommendations:  I have reviewed the sonogram results above.  Combined with the patient's current clinical course, below are my impressions and any appropriate recommendations for management based on the sonographic findings:  1. A large amount of endometrial fluid noted , >8 cm in longest axis, with a small 3 cm polypoid structure along posterior uterine endometrial surface, without documented vascularity, likely representing endometrial polyp. 2 Small atrophic ovaries bilateral. 3. Depending on patient clinical status and desire for care, I recommend consideration of hysteroscopy and polyp excision. 4 Pt may require a brief regimen of vaginal estrogen, if  procedure being considered, to overcome suspected cervical stenosis.  FERGUSON,JOHN V  Pt does not want to pursue work up She has not had any bleeding and will wait until she does and if she does Of course she is concerned about her age and anesthesia  She is comfortable doing nothing    MEDS ordered this encounter: No orders of the defined types were placed in this encounter.    Orders for this encounter: No orders of the defined types were placed in this encounter.    Plan:  Follow Up: Pessary checks and evals     Face to face time:  10 minutes  Greater than 50% of the visit time was spent in counseling and coordination of care with the patient.  The summary and outline of the counseling and care coordination is summarized in the note above.   All questions were answered.  Past Medical History  Diagnosis Date  . Hypertension   . GERD (gastroesophageal reflux disease)   . High cholesterol   . SVT (supraventricular tachycardia) ([place])     Noted 09/2011 responsive to cardizem  . NSVT (nonsustained ventricular tachycardia) (HCC)     Short run of monomorphic VT in setting of hypokalemia during 09/2011 hospitalization. EF 60-65% by echo 09/26/11  . Right bundle branch block   . Constipation   . Vertigo   . Dyspepsia   . Arthralgia     Past Surgical History  Procedure Laterality Date  . Cholecystectomy    . Breast lumpectomy      left breast cancer years  ago    OB History    No data available      Allergies  Allergen Reactions  . Cozaar [Losartan] Swelling    Oral swelling     Social History   Social History  . Marital Status: Widowed    Spouse Name: N/A  . Number of Children: N/A  . Years of Education: N/A   Social History Main Topics  . Smoking status: Never Smoker   . Smokeless tobacco: None  . Alcohol Use: No  . Drug Use: No  . Sexual Activity: Not Currently   Other Topics Concern  . None   Social History Narrative    History  reviewed. No pertinent family history.

## 2015-11-20 ENCOUNTER — Ambulatory Visit (INDEPENDENT_AMBULATORY_CARE_PROVIDER_SITE_OTHER): Payer: Medicare Other | Admitting: Otolaryngology

## 2015-12-16 ENCOUNTER — Encounter: Payer: Self-pay | Admitting: Obstetrics & Gynecology

## 2015-12-16 ENCOUNTER — Ambulatory Visit (INDEPENDENT_AMBULATORY_CARE_PROVIDER_SITE_OTHER): Payer: Medicare Other | Admitting: Obstetrics & Gynecology

## 2015-12-16 VITALS — BP 178/70 | HR 72 | Wt 148.0 lb

## 2015-12-16 DIAGNOSIS — N813 Complete uterovaginal prolapse: Secondary | ICD-10-CM | POA: Diagnosis not present

## 2015-12-16 NOTE — Progress Notes (Signed)
Patient ID: [name], female   DOB: 03/18/1921, 82 y.o.   MRN: [id number] Chief Complaint  Patient presents with  . 3 month follow-up    Pessary    Blood pressure 178/70, pulse 72, weight 148 lb (67.132 kg).  [name] presents today for routine follow up related to her pessary.   She uses a Milex ring with support #4 She reports no vaginal discharge or vaginal bleeding.  Exam reveals no undue vaginal mucosal pressure of breakdown, no discharge and no vaginal bleeding.  The pessary is removed, cleaned and replaced without difficulty.    [name] will be sen back in 4 months for continued follow up.  @[facility] @ 12/16/2015 2:07 PM

## 2016-04-20 ENCOUNTER — Ambulatory Visit: Payer: Medicare Other | Admitting: Obstetrics & Gynecology

## 2016-04-27 ENCOUNTER — Ambulatory Visit (INDEPENDENT_AMBULATORY_CARE_PROVIDER_SITE_OTHER): Payer: Medicare Other | Admitting: Obstetrics & Gynecology

## 2016-04-27 ENCOUNTER — Encounter: Payer: Self-pay | Admitting: Obstetrics & Gynecology

## 2016-04-27 VITALS — BP 160/70 | HR 72 | Wt 151.0 lb

## 2016-04-27 DIAGNOSIS — N813 Complete uterovaginal prolapse: Secondary | ICD-10-CM | POA: Diagnosis not present

## 2016-04-27 NOTE — Progress Notes (Signed)
Patient ID: [name], female   DOB: 03/18/1921, 82 y.o.   MRN: [id number] Chief Complaint  Patient presents with  . 3 month follow-up    check/ clean pessary    Blood pressure (!) 160/70, pulse 72, weight 151 lb (68.5 kg).  [name] presents today for routine follow up related to her pessary.   She uses a Milex ring with support #4 She reports no vaginal discharge or vaginal bleeding.  Exam reveals no undue vaginal mucosal pressure of breakdown, no discharge and no vaginal bleeding.  The pessary is removed, cleaned and replaced without difficulty.    RHYEN MCMICKENS will be sen back in 4 months for continued follow up.  Florian Buff, MD  04/27/2016 3:40 PM

## 2016-07-13 IMAGING — CT CT ABD-PELV W/ CM
2 of 8 series · 14 of 46 positions shown, 18 images · IV contrast (Omnipaque 300)
Comparison: 03/23/2013

CLINICAL DATA: Right lower quadrant pain since 04/19/2015. Severe
right hip pain, worse with moving.

EXAM:
CT ABDOMEN AND PELVIS WITH CONTRAST
TECHNIQUE: Multidetector CT imaging of the abdomen and pelvis was performed
using the standard protocol following bolus administration of
intravenous contrast.
CONTRAST:  25mL OMNIPAQUE IOHEXOL 300 MG/ML SOLN, 100mL OMNIPAQUE
IOHEXOL 300 MG/ML SOLN

[Series 2: abd_pel_with 5.0 b40f · axial · 0.74mm/px · z∈[-424,-54]mm · 11 of 84 slices shown, 15 images]
[im 5/84  soft-tissue]
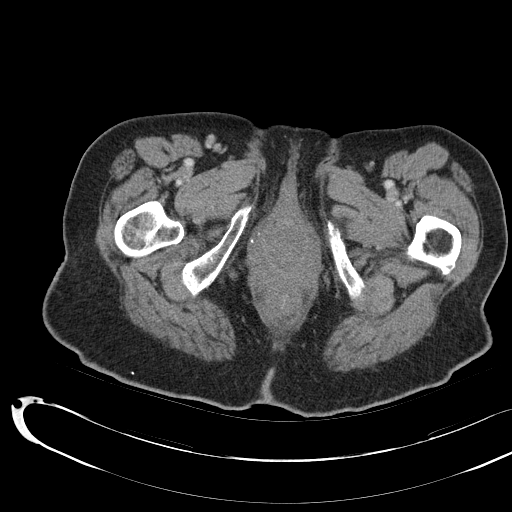
[im 5/84  bone]
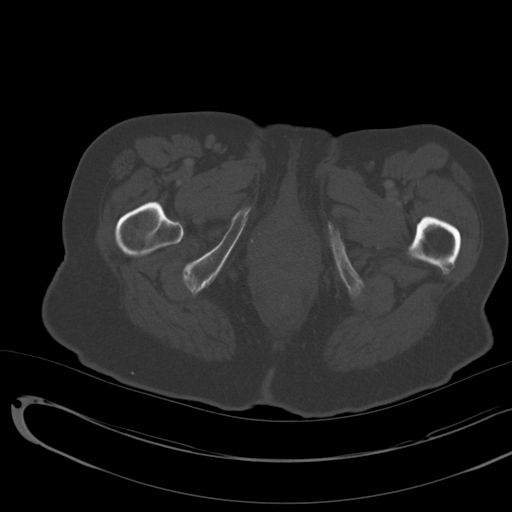
[im 15/84  soft-tissue]
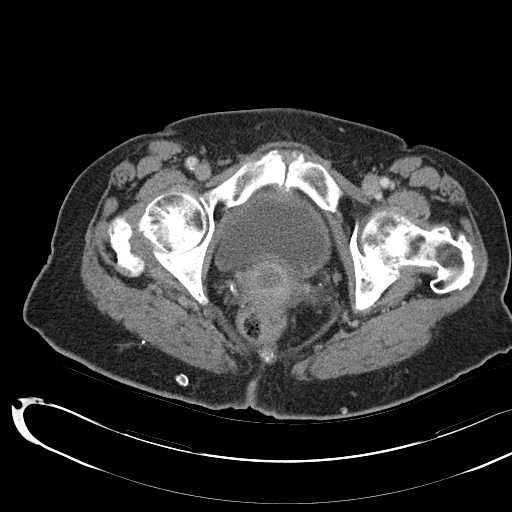
[im 25/84  soft-tissue]
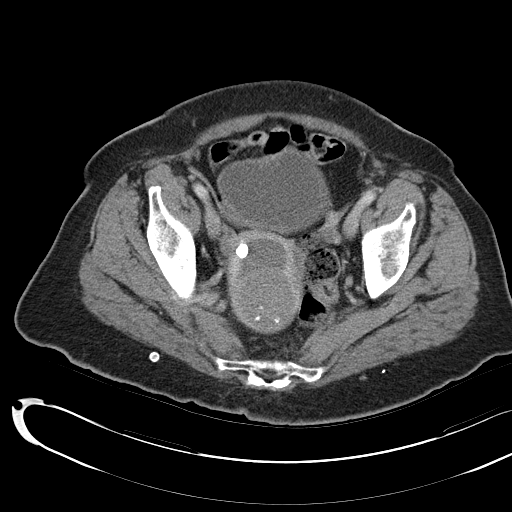
[im 35/84  soft-tissue]
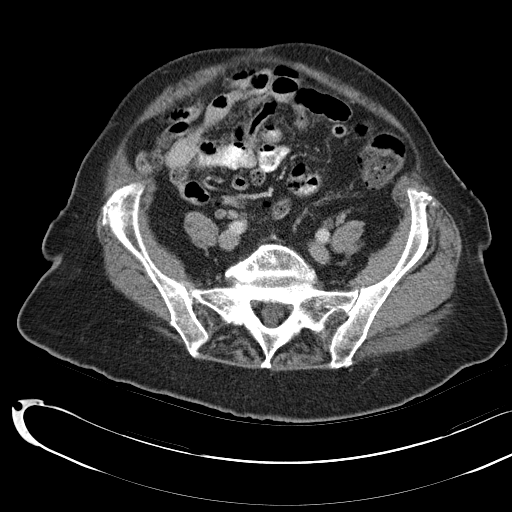
[im 44/84  soft-tissue]
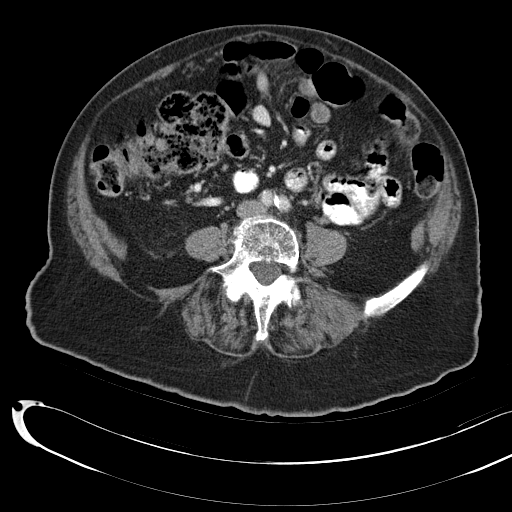
[im 49/84  soft-tissue]
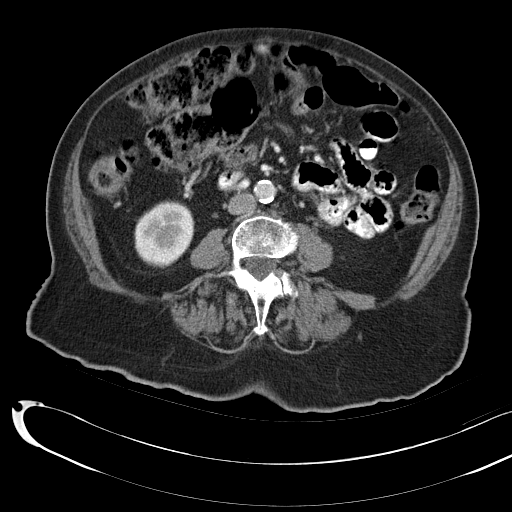
[im 59/84  soft-tissue]
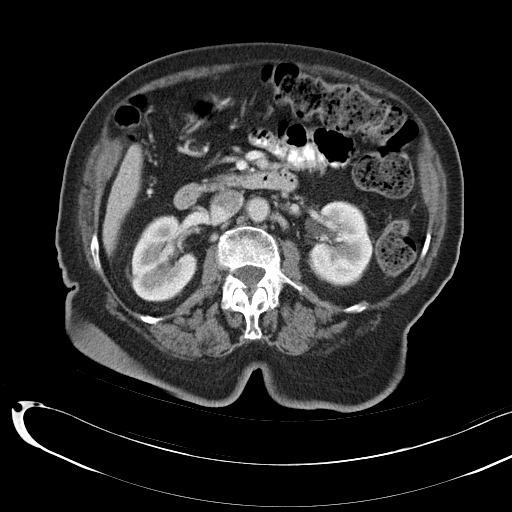
[im 64/84  lung]
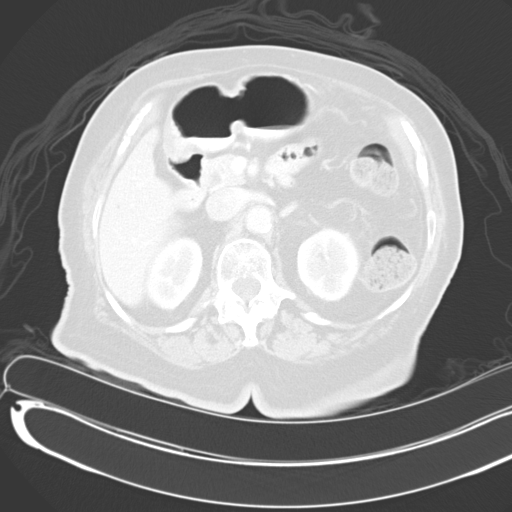
[im 69/84  soft-tissue]
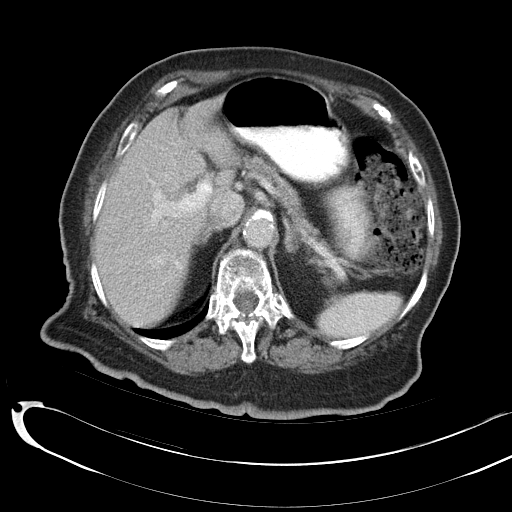
[im 69/84  lung]
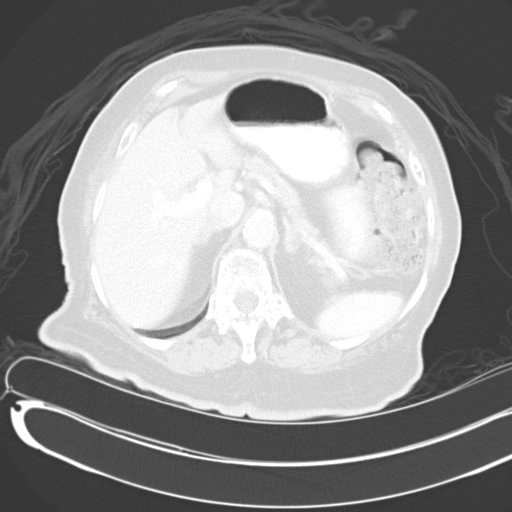
[im 74/84  lung]
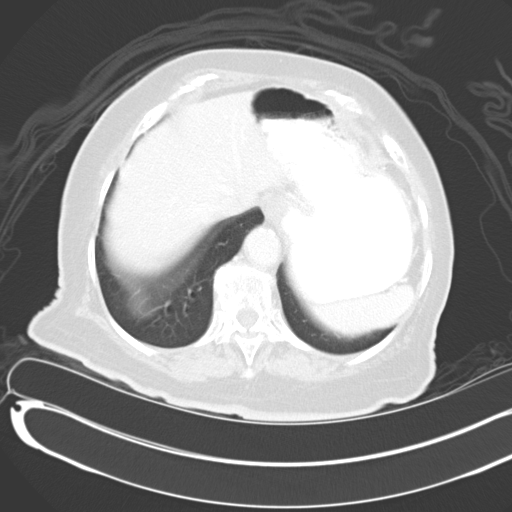
[im 79/84  soft-tissue]
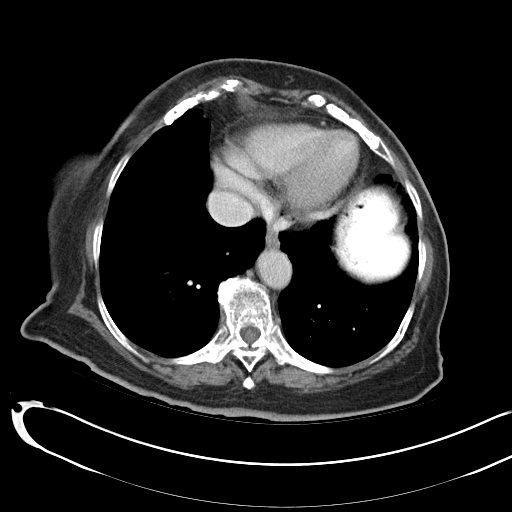
[im 79/84  lung]
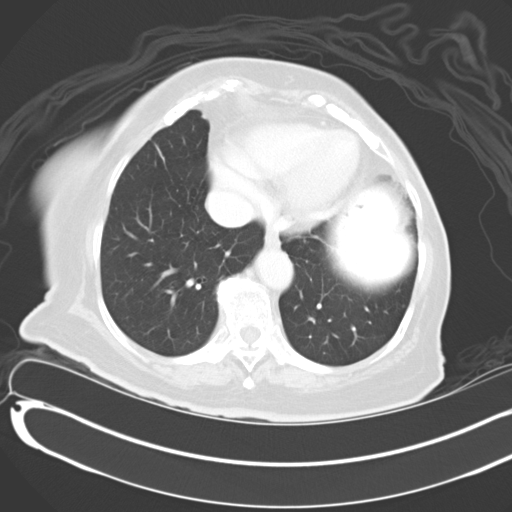
[im 79/84  bone]
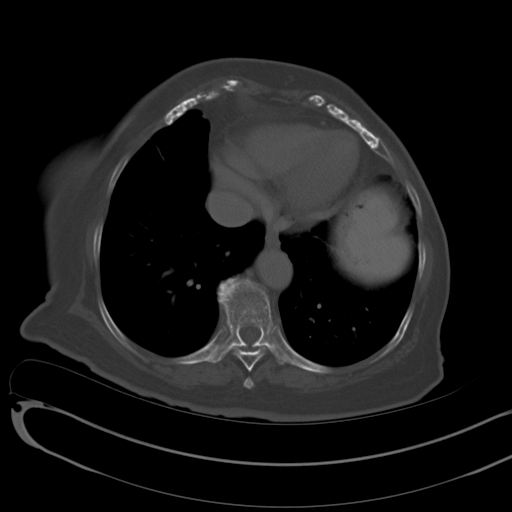

[Series 5: abd_pel_with 3.0 spo cor · coronal · 0.79mm/px · 3 of 95 slices shown]
[im 24/95  soft-tissue]
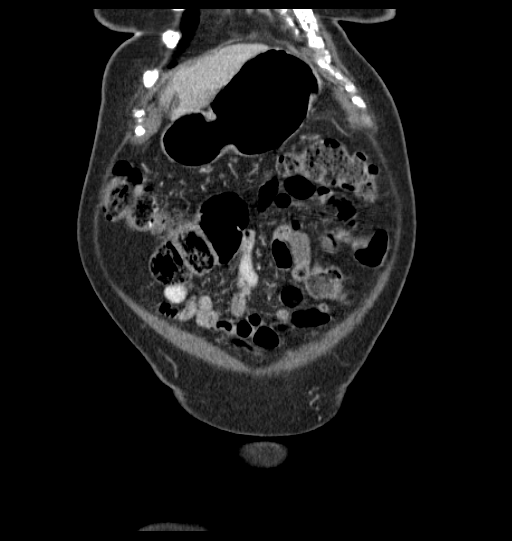
[im 48/95  soft-tissue]
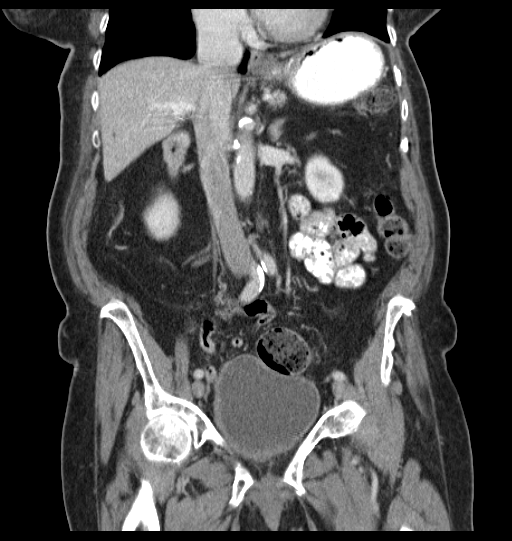
[im 71/95  soft-tissue]
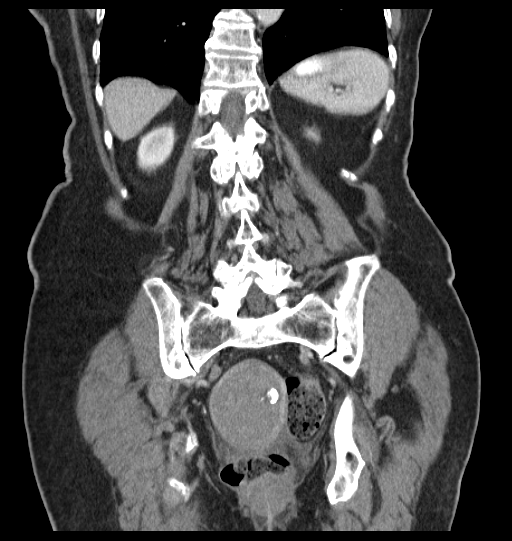

[14 of 46 positions shown; findings below may reference images not displayed]

FINDINGS: Lower chest and abdominal wall:  No contributory findings.

Hepatobiliary: No focal liver abnormality.Cholecystectomy. Stable
common bile duct appearance.

Pancreas: Unremarkable.

Spleen: Unremarkable.

Adrenals/Urinary Tract: Negative adrenals. No hydronephrosis or
stone. Bladder wall thickening, chronic and potentially from chronic
outlet obstruction.

Reproductive:Pelvic floor laxity. Chronic low-density thickening of
the endometrial cavity to 2 cm. Although differentiation of
postobstructive fluid or endometrial thickening/neoplasia is not
possible by CT, there has been no change in the appearance since
4264, suggesting a benign process. Uterine fibroids, including a 54
mm posterior low intramural fibroid. Negative adnexa.

Stomach/Bowel: No obstruction. Formed stool throughout most colonic
segments, without obstruction. No inflammatory changes. Lax
ileocecal mesentery, as seen on the previous study, with cecum in
the central abdomen. No appendicitis.

Vascular/Lymphatic: No acute vascular abnormality. No mass or
adenopathy.

Peritoneal: No ascites or pneumoperitoneum.

Musculoskeletal: No fracture or visible joint effusion to explain
the right hip pain. There are stable degenerative changes to the
symphysis pubis and sacroiliac joints. Multilevel facet arthropathy
and degenerative disc disease.
IMPRESSION: 1. No acute finding or change since [DATE]. Moderate stool volume, correlate for clinical constipation.
3. Abnormal endometrial fluid or thickening, but stable since at
least 4264 and favoring benign process such is cervical stenosis.
Outpatient pelvic sonography could further evaluate if clinically
indicated.

## 2016-07-13 IMAGING — DX DG ABDOMEN 1V
2 series · 2 of 2 positions shown · non-contrast
Comparison: Abdominal radiograph 06/26/2014.

CLINICAL DATA: Right lower lateral abdominal pain and abdominal
distension

EXAM:
ABDOMEN - 1 VIEW

[abdomen kub (1 of 2)]
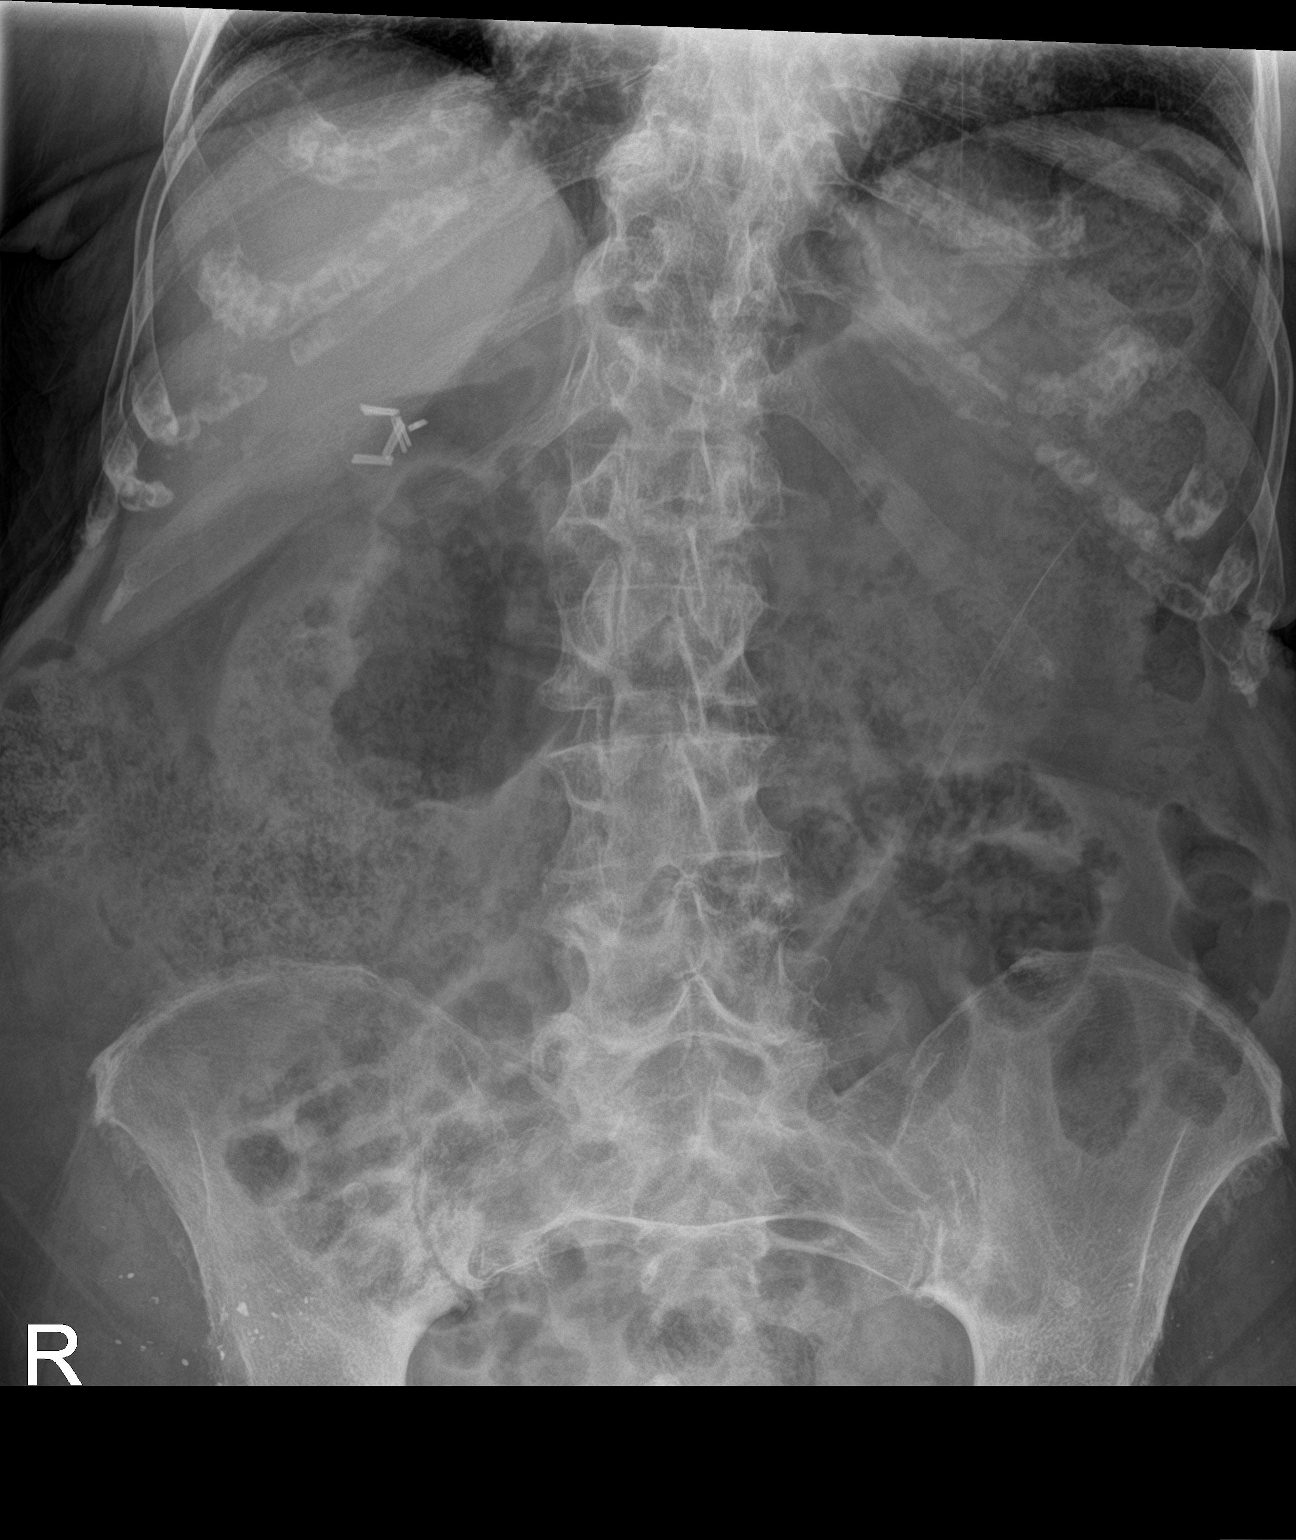

[abdomen kub (2 of 2)]
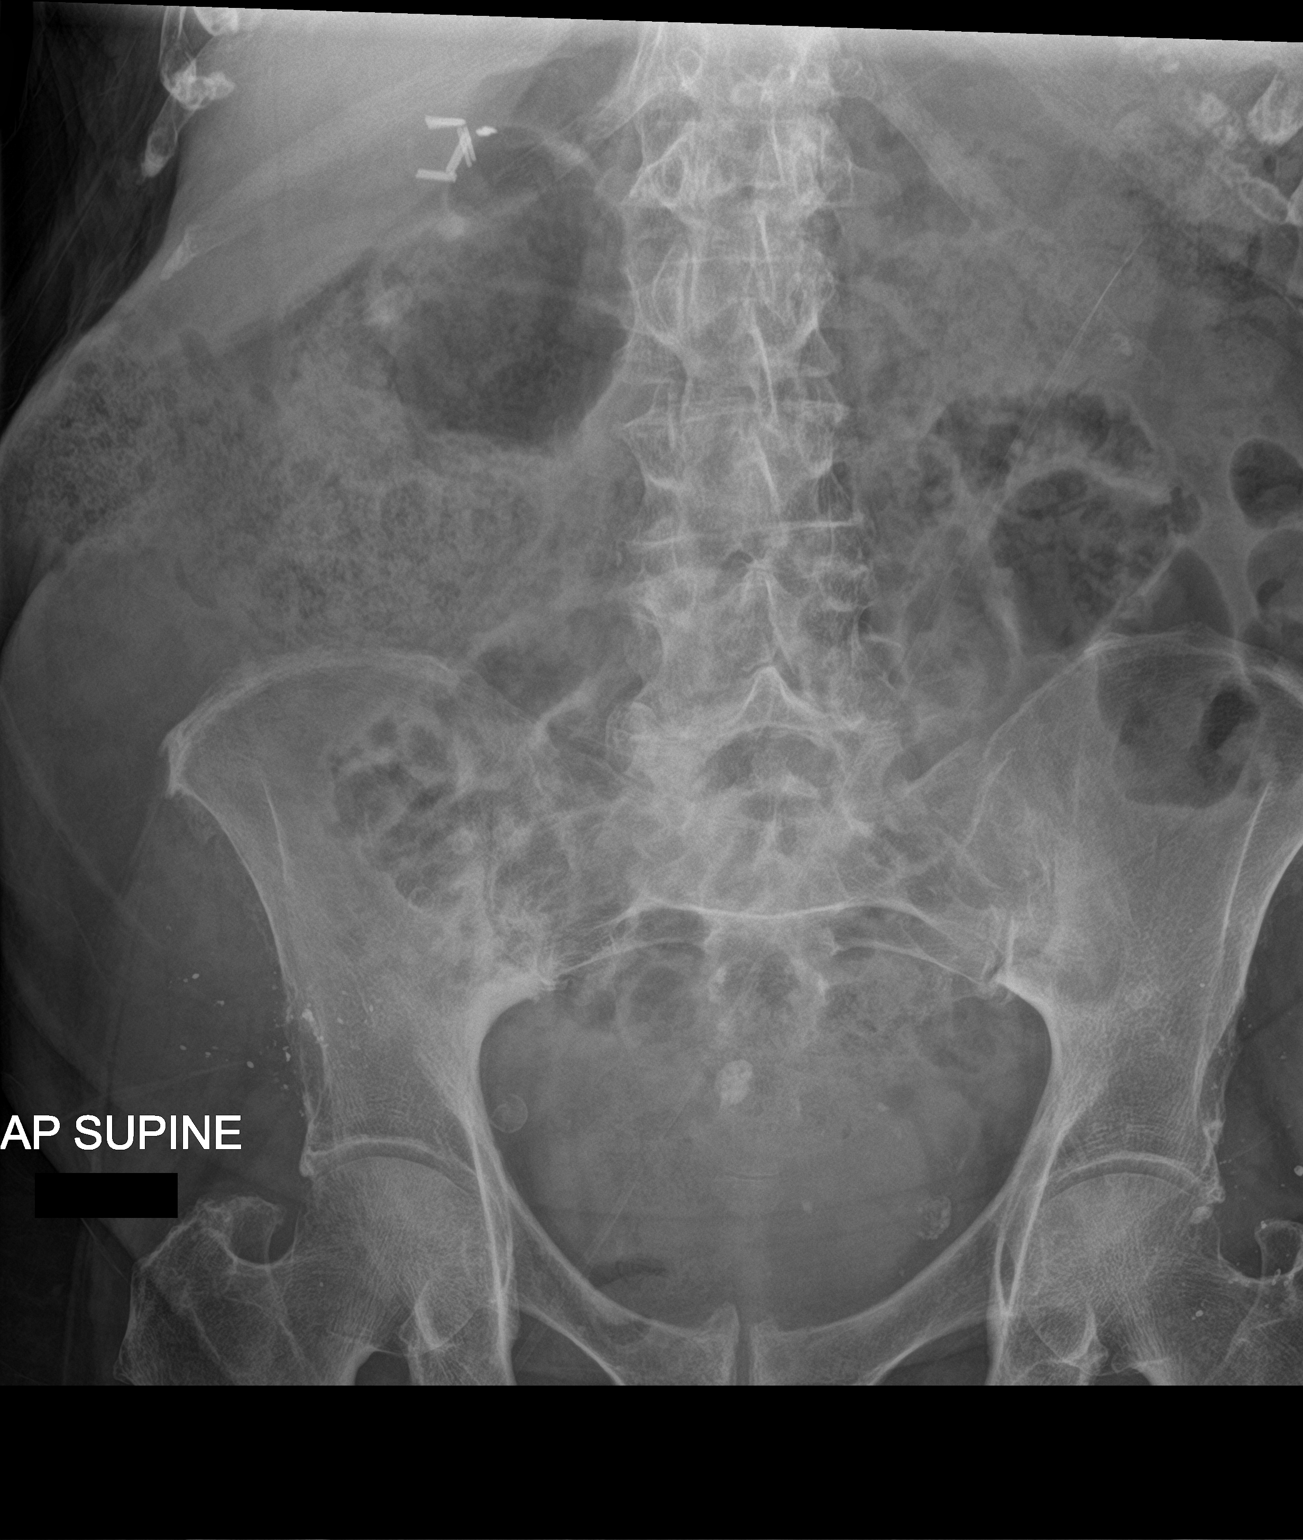

[2 of 2 positions shown; findings below may reference images not displayed]

FINDINGS: The bowel gas pattern is nonobstructive. There is a large amount of
formed stool material throughout the colon. Opacity of bowel gas
within the pelvis is likely secondary to a distended urinary
bladder. Cholecystectomy clips are seen. There is dextro convex
scoliosis of the lumbosacral spine with associated osteoarthritic
changes.
IMPRESSION: Nonobstructive bowel gas pattern.

Large amount of formed stool throughout the colon, consistent with
constipation.

## 2016-08-24 ENCOUNTER — Encounter (INDEPENDENT_AMBULATORY_CARE_PROVIDER_SITE_OTHER): Payer: Self-pay

## 2016-08-24 ENCOUNTER — Ambulatory Visit (INDEPENDENT_AMBULATORY_CARE_PROVIDER_SITE_OTHER): Payer: Medicare Other | Admitting: Obstetrics & Gynecology

## 2016-08-24 ENCOUNTER — Encounter: Payer: Self-pay | Admitting: Obstetrics & Gynecology

## 2016-08-24 VITALS — BP 140/60 | HR 64 | Wt 151.0 lb

## 2016-08-24 DIAGNOSIS — N813 Complete uterovaginal prolapse: Secondary | ICD-10-CM | POA: Diagnosis not present

## 2016-08-24 NOTE — Progress Notes (Signed)
Patient ID: [name], female   DOB: 03/18/1921, 83 y.o.   MRN: [id number] Chief Complaint  Patient presents with  . Pessary Check    clean    Blood pressure 140/60, pulse 64, weight 151 lb (68.5 kg).  [name] presents today for routine follow up related to her pessary.   She uses a Milex ring with support #4 She reports no vaginal discharge or vaginal bleeding.  Exam reveals no undue vaginal mucosal pressure of breakdown, no discharge and no vaginal bleeding.  The pessary is removed, cleaned and replaced without difficulty.    [name] will be sen back in 4 months for continued follow up.  Florian Buff, MD  08/24/2016 2:57 PM

## 2016-12-23 ENCOUNTER — Encounter: Payer: Self-pay | Admitting: *Deleted

## 2016-12-23 ENCOUNTER — Ambulatory Visit: Payer: Medicare Other | Admitting: Obstetrics & Gynecology

## 2017-01-10 ENCOUNTER — Encounter: Payer: Self-pay | Admitting: Obstetrics & Gynecology

## 2017-01-10 ENCOUNTER — Ambulatory Visit (INDEPENDENT_AMBULATORY_CARE_PROVIDER_SITE_OTHER): Payer: Medicare Other | Admitting: Obstetrics & Gynecology

## 2017-01-10 VITALS — BP 144/84 | HR 77 | Wt 151.0 lb

## 2017-01-10 DIAGNOSIS — Z4689 Encounter for fitting and adjustment of other specified devices: Secondary | ICD-10-CM | POA: Diagnosis not present

## 2017-01-10 DIAGNOSIS — N813 Complete uterovaginal prolapse: Secondary | ICD-10-CM | POA: Diagnosis not present

## 2017-01-10 NOTE — Progress Notes (Signed)
Patient ID: [name], female   DOB: 03/18/1921, 83 y.o.   MRN: [id number] Chief Complaint  Patient presents with  . Follow-up    pessary    Blood pressure (!) 144/84, pulse 77, weight 151 lb (68.5 kg).  [name] presents today for routine follow up related to her pessary.   She uses a Milex ring with support #4 She reports no vaginal discharge or vaginal bleeding.  Exam reveals no undue vaginal mucosal pressure of breakdown, no discharge and no vaginal bleeding.  The pessary is removed, cleaned and replaced without difficulty.    LAMYRA MALCOLM will be sen back in 4 months for continued follow up.  Florian Buff, MD  01/10/2017 3:39 PM

## 2017-05-12 ENCOUNTER — Ambulatory Visit: Payer: Medicare Other | Admitting: Obstetrics & Gynecology

## 2017-05-16 ENCOUNTER — Ambulatory Visit (INDEPENDENT_AMBULATORY_CARE_PROVIDER_SITE_OTHER): Payer: Medicare Other | Admitting: Obstetrics & Gynecology

## 2017-05-16 ENCOUNTER — Encounter: Payer: Self-pay | Admitting: Obstetrics & Gynecology

## 2017-05-16 VITALS — BP 180/72 | HR 70 | Ht 65.0 in | Wt 146.5 lb

## 2017-05-16 DIAGNOSIS — N813 Complete uterovaginal prolapse: Secondary | ICD-10-CM

## 2017-05-16 DIAGNOSIS — Z4689 Encounter for fitting and adjustment of other specified devices: Secondary | ICD-10-CM

## 2017-05-16 NOTE — Progress Notes (Signed)
  Patient ID: [name], female   DOB: [birth year], 83 y.o.   MRN: [id number]    Chief Complaint  Patient presents with  . Pessary cleaning   Blood pressure (!) 180/72, pulse 70, height 5\' 5"  (1.651 m), weight 146 lb 8 oz (66.5 kg).   [name] presents today forroutine maintenance related to her pessary She uses a Milex ring with support #4 and has now for the past 1+ years She denies having any problems including bleeding discharge or pelvic discomfort Additionally the pessary has remained seated without any difficulty  The pessary is removed without difficulty It is washed with soap and water The vaginal mucosa is normal with no breakdown or undue pressurend there is no appreciable discharge or bleeding noted  [name] will be seen back in 4 months for continued pessary maintenance or prior to that if needed  Florian Buff, MD

## 2017-09-15 ENCOUNTER — Encounter: Payer: Self-pay | Admitting: Obstetrics & Gynecology

## 2017-09-15 ENCOUNTER — Other Ambulatory Visit: Payer: Self-pay

## 2017-09-15 ENCOUNTER — Ambulatory Visit (INDEPENDENT_AMBULATORY_CARE_PROVIDER_SITE_OTHER): Payer: Medicare Other | Admitting: Obstetrics & Gynecology

## 2017-09-15 VITALS — BP 122/76 | HR 75 | Ht 64.0 in | Wt 145.0 lb

## 2017-09-15 DIAGNOSIS — N813 Complete uterovaginal prolapse: Secondary | ICD-10-CM | POA: Diagnosis not present

## 2017-09-15 NOTE — Progress Notes (Signed)
   Patient ID: [name], female   DOB: [birth year], 84 y.o.   MRN: [id number]   [name] presents today for management of her complete uterine procidentia She has had a Milex ring with support #4 in place now for just over 2 years She is doing well without any problems no vaginal bleeding discharge or discomfort  Blood pressure 122/76, pulse 75, height 5\' 4"  (1.626 m), weight 145 lb (65.8 kg).   On exam today the pessary is removed with minimal difficulty Vaginal evaluation reveals no undue mucosal pressure no evidence of breakdown and no bleeding The pessary is cleaned with warm water and soap and replaced in the vagina without difficulty and good seating  Ms. Nichter will be seen back again in 4 months for continued follow-up maintenance of her pessary or prior to that if needed, as well as see Ms. Rock Nephew is you would

## 2018-01-13 ENCOUNTER — Ambulatory Visit: Payer: Medicare Other | Admitting: Obstetrics & Gynecology

## 2018-01-17 ENCOUNTER — Encounter: Payer: Self-pay | Admitting: Obstetrics & Gynecology

## 2018-01-17 ENCOUNTER — Ambulatory Visit (INDEPENDENT_AMBULATORY_CARE_PROVIDER_SITE_OTHER): Payer: Medicare Other | Admitting: Obstetrics & Gynecology

## 2018-01-17 VITALS — BP 181/75 | HR 70 | Wt 142.0 lb

## 2018-01-17 DIAGNOSIS — Z4689 Encounter for fitting and adjustment of other specified devices: Secondary | ICD-10-CM

## 2018-01-17 DIAGNOSIS — N813 Complete uterovaginal prolapse: Secondary | ICD-10-CM

## 2018-01-17 NOTE — Progress Notes (Signed)
Chief Complaint  Patient presents with  . pessary maintenance    Blood pressure (!) 181/75, pulse 70, weight 142 lb (64.4 kg).  [name] presents today for routine follow up related to her pessary.   She uses a milex ring #4 She reports no vaginal discharge or vaginal bleeding.  Exam reveals no undue vaginal mucosal pressure of breakdown, no discharge and no vaginal bleeding.  The pessary is removed, cleaned and replaced without difficulty.    TEMPRENCE RHINES will be sen back in 4 months for continued follow up.  Florian Buff, MD  01/17/2018 3:31 PM

## 2018-04-11 ENCOUNTER — Other Ambulatory Visit: Payer: Self-pay

## 2018-04-11 ENCOUNTER — Encounter (HOSPITAL_COMMUNITY): Payer: Self-pay | Admitting: *Deleted

## 2018-04-11 ENCOUNTER — Emergency Department (HOSPITAL_COMMUNITY)
Admission: EM | Admit: 2018-04-11 | Discharge: 2018-04-11 | Disposition: A | Discharge status: Home / Self Care | Payer: Medicare Other | Attending: Emergency Medicine | Admitting: Emergency Medicine

## 2018-04-11 DIAGNOSIS — Z79899 Other long term (current) drug therapy: Secondary | ICD-10-CM | POA: Diagnosis not present

## 2018-04-11 DIAGNOSIS — I471 Supraventricular tachycardia: Secondary | ICD-10-CM | POA: Diagnosis not present

## 2018-04-11 DIAGNOSIS — E7800 Pure hypercholesterolemia, unspecified: Secondary | ICD-10-CM | POA: Diagnosis not present

## 2018-04-11 DIAGNOSIS — I10 Essential (primary) hypertension: Secondary | ICD-10-CM | POA: Diagnosis not present

## 2018-04-11 DIAGNOSIS — Z7982 Long term (current) use of aspirin: Secondary | ICD-10-CM | POA: Diagnosis not present

## 2018-04-11 DIAGNOSIS — R42 Dizziness and giddiness: Secondary | ICD-10-CM | POA: Diagnosis present

## 2018-04-11 LAB — CBC WITH DIFFERENTIAL/PLATELET
BASOS ABS: 0 10*3/uL (ref 0.0–0.1)
BASOS PCT: 0 %
Eosinophils Absolute: 0 10*3/uL (ref 0.0–0.7)
Eosinophils Relative: 1 %
HCT: 38.9 % (ref 36.0–46.0)
Hemoglobin: 12.4 g/dL (ref 12.0–15.0)
Lymphocytes Relative: 34 %
Lymphs Abs: 1.1 10*3/uL (ref 0.7–4.0)
MCH: 27.3 pg (ref 26.0–34.0)
MCHC: 31.9 g/dL (ref 30.0–36.0)
MCV: 85.7 fL (ref 78.0–100.0)
MONO ABS: 0.3 10*3/uL (ref 0.1–1.0)
Monocytes Relative: 8 %
NEUTROS ABS: 1.9 10*3/uL (ref 1.7–7.7)
Neutrophils Relative %: 57 %
PLATELETS: 151 10*3/uL (ref 150–400)
RBC: 4.54 MIL/uL (ref 3.87–5.11)
RDW: 15.1 % (ref 11.5–15.5)
WBC: 3.3 10*3/uL — AB (ref 4.0–10.5)

## 2018-04-11 LAB — BASIC METABOLIC PANEL
ANION GAP: 6 (ref 5–15)
BUN: 13 mg/dL (ref 8–23)
CALCIUM: 9.4 mg/dL (ref 8.9–10.3)
CO2: 29 mmol/L (ref 22–32)
Chloride: 104 mmol/L (ref 98–111)
Creatinine, Ser: 0.86 mg/dL (ref 0.44–1.00)
GFR calc Af Amer: >60 mL/min (ref >60)
GFR, EST NON AFRICAN AMERICAN: 55 mL/min — AB (ref >60)
GLUCOSE: 99 mg/dL (ref 70–99)
Potassium: 4.5 mmol/L (ref 3.5–5.1)
SODIUM: 139 mmol/L (ref 135–145)

## 2018-04-11 LAB — URINALYSIS, ROUTINE W REFLEX MICROSCOPIC
Bilirubin Urine: NEGATIVE
Glucose, UA: NEGATIVE mg/dL
Ketones, ur: NEGATIVE mg/dL
NITRITE: NEGATIVE
Protein, ur: 30 mg/dL — AB
Specific Gravity, Urine: 1.014 (ref 1.005–1.030)
WBC, UA: >50 WBC/hpf — ABNORMAL HIGH (ref 0–5)
pH: 6 (ref 5.0–8.0)

## 2018-04-11 MED ORDER — MECLIZINE HCL 12.5 MG PO TABS: 12.5000 mg | ORAL_TABLET | Freq: Three times a day (TID) | ORAL | 0 refills | Status: AC

## 2018-04-11 MED ORDER — MECLIZINE HCL 12.5 MG PO TABS
25.0000 mg | ORAL_TABLET | Freq: Once | ORAL | Status: AC
  Administered 2018-04-11: 25 mg via ORAL
  Filled 2018-04-11: qty 2

## 2018-04-11 NOTE — ED Provider Notes (Signed)
[facility] EMERGENCY DEPARTMENT Provider Note   CSN: [id number] Arrival date & time: 04/11/18  1302     History   Chief Complaint Chief Complaint  Patient presents with  . Dizziness    HPI [name] is a 84 y.o. female.  HPI Patient presents with concern of dizziness. She is here with a female companion who assists with the HPI. Patient states that she is generally well, healthy, active. She has had one prior similar episode, diagnosed with vertigo. This began yesterday, with subacute onset she has had persistent dizziness, interfering with ambulation, worse with any head motion. No syncope, no weakness in her extremities, no confusion, speech difficulty. She did have a fall, and it is unclear if this occurred before or after the beginning of her dizziness episode.  She has not taken any medication for relief, though when specifically asked, she notes that meclizine previously helped her symptoms.  Past Medical History:  Diagnosis Date  . Arthralgia   . Constipation   . Dyspepsia   . GERD (gastroesophageal reflux disease)   . High cholesterol   . Hypertension   . NSVT (nonsustained ventricular tachycardia) (HCC)    Short run of monomorphic VT in setting of hypokalemia during 09/2011 hospitalization. EF 60-65% by echo 09/26/11  . Right bundle branch block   . SVT (supraventricular tachycardia) ([place])    Noted 09/2011 responsive to cardizem  . Vertigo     Patient Active Problem List   Diagnosis Date Noted  . Dizzy 08/18/2015  . SVT (supraventricular tachycardia) ([place]) 08/18/2015  . GERD (gastroesophageal reflux disease) 11/26/2014  . Angio-edema 05/04/2014  . PSVT (paroxysmal supraventricular tachycardia) ([place]) 09/26/2011  . Right bundle branch block 09/26/2011  . Upper respiratory tract infection 09/26/2011  . Essential hypertension, benign 09/26/2011    Past Surgical History:  Procedure Laterality Date  . BREAST LUMPECTOMY     left breast cancer years  ago  . CHOLECYSTECTOMY       OB History    Gravida  5   Para  5   Term  5   Preterm      AB      Living  4     SAB      TAB      Ectopic      Multiple      Live Births  5            Home Medications    Prior to Admission medications   Medication Sig Start Date End Date Taking? Authorizing Provider  amLODipine (NORVASC) 5 MG tablet Take 5 mg by mouth every morning.     Yes [provider]  aspirin EC 81 MG tablet Take 81 mg by mouth daily.   Yes [provider]  esomeprazole (NEXIUM) 40 MG capsule Take 1 capsule by mouth daily. 03/27/18  Yes [provider]  fluticasone (FLONASE) 50 MCG/ACT nasal spray Place 2 sprays into the nose as needed for allergies. Reported on 08/06/2015   Yes [provider]  KLOR-CON M20 20 MEQ tablet Take 20 mEq by mouth daily. 01/05/18  Yes [provider]  meclizine (ANTIVERT) 25 MG tablet Take 12.5-25 mg by mouth 3 (three) times daily as needed for dizziness. Reported on 08/14/2015   Yes [provider]  metoprolol tartrate (LOPRESSOR) 25 MG tablet Take 1 tablet (25 mg total) by mouth 2 (two) times daily. 08/19/15  Yes Kathie Dike, MD  polyethylene glycol (MIRALAX / GLYCOLAX) packet Take  17 g by mouth as needed for mild constipation or moderate constipation.    Yes [provider]    Family History History reviewed. No pertinent family history.  Social History Social History   Tobacco Use  . Smoking status: Never Smoker  . Smokeless tobacco: Never Used  Substance Use Topics  . Alcohol use: No  . Drug use: No     Allergies   Cozaar [losartan]   Review of Systems Review of Systems  Constitutional:       Per HPI, otherwise negative  HENT:       Per HPI, otherwise negative  Respiratory:       Per HPI, otherwise negative  Cardiovascular:       Per HPI, otherwise negative  Gastrointestinal: Negative for vomiting.  Endocrine:       Negative aside from HPI    Genitourinary:       Neg aside from HPI   Musculoskeletal:       Per HPI, otherwise negative  Skin: Negative.   Neurological: Positive for dizziness. Negative for syncope.     Physical Exam Updated Vital Signs BP (!) 170/68   Pulse (!) 58   Temp 98.4 F (36.9 C) (Oral)   Resp 18   Wt 68 kg   SpO2 100%   BMI 25.75 kg/m   Physical Exam  Constitutional: She is oriented to person, place, and time. She appears well-developed and well-nourished. No distress.  HENT:  Head: Normocephalic and atraumatic.  Eyes: Conjunctivae and EOM are normal.  Cardiovascular: Normal rate and regular rhythm.  Pulmonary/Chest: Effort normal and breath sounds normal. No stridor. No respiratory distress.  Abdominal: She exhibits no distension.  Musculoskeletal: She exhibits no edema.  Neurological: She is alert and oriented to person, place, and time. She displays atrophy. No cranial nerve deficit.  Mild atrophy, but no substantial weakness, she moves all extremity spontaneously, has 5/5 strength, relative to to age. With head rotation laterally, eyes closed do not provoke symptoms substantially, but with eyes open, patient does feel mild dizziness.  Skin: Skin is warm and dry.  Psychiatric: She has a normal mood and affect.  Nursing note and vitals reviewed.    ED Treatments / Results  Labs (all labs ordered are listed, but only abnormal results are displayed) Labs Reviewed  BASIC METABOLIC PANEL - Abnormal; Notable for the following components:      Result Value   GFR calc non Af Amer 55 (*)    All other components within normal limits  CBC WITH DIFFERENTIAL/PLATELET - Abnormal; Notable for the following components:   WBC 3.3 (*)    All other components within normal limits  URINALYSIS, ROUTINE W REFLEX MICROSCOPIC - Abnormal; Notable for the following components:   APPearance HAZY (*)    Hgb urine dipstick SMALL (*)    Protein, ur 30 (*)    Leukocytes, UA LARGE (*)    WBC, UA >50 (*)     Bacteria, UA RARE (*)    Non Squamous Epithelial 0-5 (*)    All other components within normal limits  URINE CULTURE    EKG EKG Interpretation  Date/Time:  Tuesday April 11 2018 13:14:49 EDT Ventricular Rate:  69 PR Interval:    QRS Duration: 136 QT Interval:  424 QTC Calculation: 454 R Axis:   -63 Text Interpretation:  Artifact Sinus rhythm with 1st degree A-V block Right bundle branch block Left anterior fascicular block  Bifascicular block  No significant change since  last tracing Abnormal ekg Confirmed by Carmin Muskrat [phone]) on 04/11/2018 1:19:20 PM   Radiology No results found.  Procedures Procedures (including critical care time)  Medications Ordered in ED Medications  meclizine (ANTIVERT) tablet 25 mg (25 mg Oral Given 04/11/18 1353)     Initial Impression / Assessment and Plan / ED Course  I have reviewed the triage vital signs and the nursing notes.  Pertinent labs & imaging results that were available during my care of the patient were reviewed by me and considered in my medical decision making (see chart for details).     4:51 PM Patient in no distress, states that she feels better, she has received meclizine here. We discussed all findings including abnormal urinalysis, but patient confirms that she has no urinary symptoms at all. Patient is afebrile, with no leukocytosis, and occult UTI considered, but patient does not warrant empiric therapy. Urine culture will be sent. Given her improvement here, history of vertigo with similar symptoms, absence of distress, and reassuring vital signs, the patient was started on meclizine, discharged in stable condition.  Final Clinical Impressions(s) / ED Diagnoses  Dizziness   Carmin Muskrat, MD 04/11/18 1652

## 2018-04-11 NOTE — ED Triage Notes (Signed)
Patient with dizziness that began yesterday with one fall, denies hitting head or any other injury.  Patient stated she has had history of vertigo in the past.

## 2018-04-11 NOTE — Discharge Instructions (Signed)
As discussed, today's evaluation was generally reassuring. Your dizziness is likely due to vertigo. Please recall that a secondary evaluation has been conducted on the urine sample, and you will be made aware of abnormal findings.  Return here for concerning changes otherwise be sure to follow-up with your primary care physician.

## 2018-04-11 NOTE — ED Notes (Signed)
Have notified pt of urine sample

## 2018-04-13 LAB — URINE CULTURE

## 2018-05-16 ENCOUNTER — Encounter (INDEPENDENT_AMBULATORY_CARE_PROVIDER_SITE_OTHER): Payer: Self-pay

## 2018-05-16 ENCOUNTER — Encounter: Payer: Self-pay | Admitting: Obstetrics & Gynecology

## 2018-05-16 ENCOUNTER — Other Ambulatory Visit: Payer: Self-pay

## 2018-05-16 ENCOUNTER — Ambulatory Visit (INDEPENDENT_AMBULATORY_CARE_PROVIDER_SITE_OTHER): Payer: Medicare Other | Admitting: Obstetrics & Gynecology

## 2018-05-16 VITALS — BP 180/87 | HR 72 | Ht 63.0 in | Wt 144.0 lb

## 2018-05-16 DIAGNOSIS — N813 Complete uterovaginal prolapse: Secondary | ICD-10-CM | POA: Diagnosis not present

## 2018-05-16 DIAGNOSIS — Z4689 Encounter for fitting and adjustment of other specified devices: Secondary | ICD-10-CM | POA: Diagnosis not present

## 2018-05-16 NOTE — Progress Notes (Signed)
Chief Complaint  Patient presents with  . pessary maintenance    Blood pressure (!) 180/87, pulse 72, height 5\' 3"  (1.6 m), weight 144 lb (65.3 kg).  [name] presents today for routine follow up related to her pessary.   She uses a milex ring with support #6 She reports no vaginal discharge or vaginal bleeding.  Exam reveals no undue vaginal mucosal pressure of breakdown, no discharge and no vaginal bleeding.  The pessary is removed, cleaned and replaced without difficulty.    JAVARIA KNAPKE will be sen back in 4 months for continued follow up.  Florian Buff, MD  05/16/2018 3:25 PM

## 2018-09-14 ENCOUNTER — Ambulatory Visit: Payer: Medicare Other | Admitting: Obstetrics & Gynecology

## 2018-09-22 ENCOUNTER — Encounter: Payer: Self-pay | Admitting: *Deleted

## 2018-09-22 ENCOUNTER — Ambulatory Visit: Payer: Medicare Other | Admitting: Obstetrics & Gynecology

## 2018-10-05 ENCOUNTER — Encounter: Payer: Self-pay | Admitting: Obstetrics & Gynecology

## 2018-10-05 ENCOUNTER — Ambulatory Visit (INDEPENDENT_AMBULATORY_CARE_PROVIDER_SITE_OTHER): Payer: Medicare Other | Admitting: Obstetrics & Gynecology

## 2018-10-05 ENCOUNTER — Other Ambulatory Visit: Payer: Self-pay

## 2018-10-05 VITALS — BP 191/88 | HR 73 | Wt 148.0 lb

## 2018-10-05 DIAGNOSIS — Z4689 Encounter for fitting and adjustment of other specified devices: Secondary | ICD-10-CM

## 2018-10-05 DIAGNOSIS — N813 Complete uterovaginal prolapse: Secondary | ICD-10-CM

## 2018-10-05 NOTE — Progress Notes (Signed)
Chief Complaint  Patient presents with  . pessary maintenance    Blood pressure (!) 191/88, pulse 73, weight 148 lb (67.1 kg).  [name] presents today for routine follow up related to her pessary.   She uses a Milex ring with support #6 She reports little vaginal discharge or vaginal bleeding.  Exam reveals no undue vaginal mucosal pressure of breakdown, no discharge and no vaginal bleeding.  The pessary is removed, cleaned and replaced without difficulty.    [name] will be sen back in 4 months for continued follow up.  Florian Buff, MD  10/05/2018 1:57 PM

## 2019-02-05 ENCOUNTER — Telehealth: Payer: Self-pay | Admitting: *Deleted

## 2019-02-05 NOTE — Telephone Encounter (Signed)
I called patient to remind her of her appointment tomorrow, I made her ware of the covid restrictions.

## 2019-02-06 ENCOUNTER — Ambulatory Visit (INDEPENDENT_AMBULATORY_CARE_PROVIDER_SITE_OTHER): Payer: Medicare Other | Admitting: Obstetrics & Gynecology

## 2019-02-06 ENCOUNTER — Other Ambulatory Visit: Payer: Self-pay

## 2019-02-06 ENCOUNTER — Encounter: Payer: Self-pay | Admitting: Obstetrics & Gynecology

## 2019-02-06 VITALS — BP 187/79 | HR 71 | Ht 63.0 in | Wt 148.0 lb

## 2019-02-06 DIAGNOSIS — N813 Complete uterovaginal prolapse: Secondary | ICD-10-CM

## 2019-02-06 DIAGNOSIS — Z4689 Encounter for fitting and adjustment of other specified devices: Secondary | ICD-10-CM

## 2019-02-25 ENCOUNTER — Encounter: Payer: Self-pay | Admitting: Obstetrics & Gynecology

## 2019-02-25 NOTE — Progress Notes (Signed)
Chief Complaint  Patient presents with  . Pessary cleaning    Blood pressure (!) 187/79, pulse 71, height 5\' 3"  (1.6 m), weight 148 lb (67.1 kg).  [name] presents today for routine follow up related to her pessary.   She uses a Milex ring with support #6 She reports no vaginal discharge or vaginal bleeding.  Exam reveals no undue vaginal mucosal pressure of breakdown, no discharge and no vaginal bleeding.  The pessary is removed, cleaned and replaced without difficulty.    [name] will be sen back in 4 months for continued follow up.  Florian Buff, MD  02/25/2019 5:07 PM

## 2019-04-24 ENCOUNTER — Ambulatory Visit: Payer: Medicare Other | Admitting: Family Medicine

## 2019-05-03 ENCOUNTER — Other Ambulatory Visit: Payer: Self-pay

## 2019-05-04 ENCOUNTER — Ambulatory Visit (INDEPENDENT_AMBULATORY_CARE_PROVIDER_SITE_OTHER): Payer: Medicare Other | Admitting: Family Medicine

## 2019-05-04 ENCOUNTER — Encounter: Payer: Self-pay | Admitting: Family Medicine

## 2019-05-04 VITALS — BP 170/68 | HR 78 | Temp 98.0°F | Resp 18 | Ht 63.0 in | Wt 144.0 lb

## 2019-05-04 DIAGNOSIS — Z23 Encounter for immunization: Secondary | ICD-10-CM | POA: Diagnosis not present

## 2019-05-04 DIAGNOSIS — I10 Essential (primary) hypertension: Secondary | ICD-10-CM | POA: Diagnosis not present

## 2019-05-04 DIAGNOSIS — I471 Supraventricular tachycardia, unspecified: Secondary | ICD-10-CM

## 2019-05-04 DIAGNOSIS — E782 Mixed hyperlipidemia: Secondary | ICD-10-CM | POA: Diagnosis not present

## 2019-05-04 DIAGNOSIS — K5900 Constipation, unspecified: Secondary | ICD-10-CM | POA: Insufficient documentation

## 2019-05-04 DIAGNOSIS — K219 Gastro-esophageal reflux disease without esophagitis: Secondary | ICD-10-CM

## 2019-05-04 DIAGNOSIS — Z780 Asymptomatic menopausal state: Secondary | ICD-10-CM

## 2019-05-04 MED ORDER — KLOR-CON M20 20 MEQ PO TBCR: 20.0000 meq | EXTENDED_RELEASE_TABLET | Freq: Every day | ORAL | 1 refills | Status: DC

## 2019-05-04 MED ORDER — METOPROLOL TARTRATE 25 MG PO TABS: 25.0000 mg | ORAL_TABLET | Freq: Two times a day (BID) | ORAL | 1 refills | Status: DC

## 2019-05-04 MED ORDER — ESOMEPRAZOLE MAGNESIUM 40 MG PO CPDR: 40.0000 mg | DELAYED_RELEASE_CAPSULE | Freq: Every day | ORAL | 1 refills | Status: DC

## 2019-05-04 MED ORDER — AMLODIPINE BESYLATE 5 MG PO TABS: 5.0000 mg | ORAL_TABLET | ORAL | 1 refills | Status: DC

## 2019-05-04 NOTE — Progress Notes (Addendum)
New Patient Office Visit  Assessment & Plan:  1. Essential hypertension, benign - Encouraged to take medications daily and to try to keep a log of her blood pressure if she can get a cuff. Discussed diet and gave education on the DASH diet. Encouraged to decrease salt intake.  - metoprolol tartrate (LOPRESSOR) 25 MG tablet; Take 1 tablet (25 mg total) by mouth 2 (two) times daily.  Dispense: 180 tablet; Refill: 1 - amLODipine (NORVASC) 5 MG tablet; Take 1 tablet (5 mg total) by mouth every morning.  Dispense: 90 tablet; Refill: 1 - CMP14+EGFR  2. PSVT (paroxysmal supraventricular tachycardia) ([place]) - Well controlled on current regimen.  - metoprolol tartrate (LOPRESSOR) 25 MG tablet; Take 1 tablet (25 mg total) by mouth 2 (two) times daily.  Dispense: 180 tablet; Refill: 1 - CMP14+EGFR  3. Mixed hyperlipidemia - Not on medication for cholesterol. Controlled.  - CMP14+EGFR  4. Gastroesophageal reflux disease without esophagitis - Well controlled on current regimen.  - esomeprazole (NEXIUM) 40 MG capsule; Take 1 capsule (40 mg total) by mouth daily.  Dispense: 90 capsule; Refill: 1 - CMP14+EGFR  5. Constipation, unspecified constipation type - Well controlled on current regimen of Miralax PRN.   6. Postmenopausal estrogen deficiency - DG [facility] DEXA  I have placed a call to her son at her request to update him on this visit. He as not available, a message was left for him to return my call.   Addendum: patient's son did return my call. [name]' visit was discussed with him. He is going to purchase a BP cuff for her.   Follow-up: Return in about 4 weeks (around 06/01/2019) for HTN.   Hendricks Limes, MSN, APRN, FNP-C [facility]  Subjective:  Patient ID: [name], female    DOB: 05-May-1920  Age: 85 y.o. MRN: [id number]  Patient Care Team: Loman Brooklyn, FNP as PCP - General (Family Medicine)  CC:  Chief Complaint  Patient presents with   . Establish Care    New - [place]    HPI ISHITHA ROPER presents to establish care. She is transferring care from Dr. Murrell Redden office as he has retired and the office has closed. Patient is accompanied by her "adopted daughter" who she is okay with being present.   Patient has no complaints or concerns.  Her BP is elevated today but she reports she just woke up and came here and has therefore not taken her medication yet. She does not check BP at home as she reports she does not have a cuff. She does add salt to grits and eats sausage every morning.    Review of Systems  Constitutional: Negative for chills, fever, malaise/fatigue and weight loss.  HENT: Negative for congestion, ear discharge, ear pain, nosebleeds, sinus pain, sore throat and tinnitus.   Eyes: Negative for blurred vision, double vision, pain, discharge and redness.  Respiratory: Negative for cough, shortness of breath and wheezing.   Cardiovascular: Negative for chest pain, palpitations and leg swelling.  Gastrointestinal: Positive for constipation. Negative for abdominal pain, diarrhea, heartburn, nausea and vomiting.  Genitourinary: Negative for dysuria, frequency and urgency.  Musculoskeletal: Negative for myalgias.  Skin: Negative for rash.  Neurological: Negative for dizziness, seizures, weakness and headaches.  Psychiatric/Behavioral: Negative for depression, substance abuse and suicidal ideas. The patient is not nervous/anxious.     Current Outpatient Medications:  .  amLODipine (NORVASC) 5 MG tablet, Take 1 tablet (5 mg total) by mouth every  morning., Disp: 90 tablet, Rfl: 1 .  aspirin EC 81 MG tablet, Take 81 mg by mouth daily., Disp: , Rfl:  .  esomeprazole (NEXIUM) 40 MG capsule, Take 1 capsule (40 mg total) by mouth daily., Disp: 90 capsule, Rfl: 1 .  metoprolol tartrate (LOPRESSOR) 25 MG tablet, Take 1 tablet (25 mg total) by mouth 2 (two) times daily., Disp: 180 tablet, Rfl: 1 .  polyethylene glycol  (MIRALAX / GLYCOLAX) 17 g packet, Take 17 g by mouth daily as needed., Disp: , Rfl:  .  KLOR-CON M20 20 MEQ tablet, Take 1 tablet (20 mEq total) by mouth daily., Disp: 90 tablet, Rfl: 1  Allergies  Allergen Reactions  . Cozaar [Losartan] Swelling    Oral swelling     Past Medical History:  Diagnosis Date  . Arthralgia   . Breast cancer ([place])    left breast   . Constipation   . Dyspepsia   . GERD (gastroesophageal reflux disease)   . High cholesterol   . Hypertension   . NSVT (nonsustained ventricular tachycardia) ([facility])    Short run of monomorphic VT in setting of hypokalemia during 09/2011 hospitalization. EF 60-65% by echo 09/26/11  . Right bundle branch block   . SVT (supraventricular tachycardia) ([place])    Noted 09/2011 responsive to cardizem  . Vertigo     Past Surgical History:  Procedure Laterality Date  . BREAST LUMPECTOMY     left breast cancer years ago  . CHOLECYSTECTOMY    . EYE SURGERY     1- eye - cataract removal   . TONSILLECTOMY      Family History  Problem Relation Age of Onset  . Hypertension Mother   . Tuberculosis Father   . Hypertension Father     Social History   Socioeconomic History  . Marital status: Widowed    Spouse name: Not on file  . Number of children: 4  . Years of education: Not on file  . Highest education level: Not on file  Occupational History  . Not on file  Social Needs  . Financial resource strain: Not on file  . Food insecurity    Worry: Not on file    Inability: Not on file  . Transportation needs    Medical: Not on file    Non-medical: Not on file  Tobacco Use  . Smoking status: Never Smoker  . Smokeless tobacco: Never Used  Substance and Sexual Activity  . Alcohol use: No  . Drug use: No  . Sexual activity: Not Currently    Birth control/protection: Post-menopausal  Lifestyle  . Physical activity    Days per week: Not on file    Minutes per session: Not on file  . Stress: Not on file  Relationships  .  Social Herbalist on phone: Not on file    Gets together: Not on file    Attends religious service: Not on file    Active member of club or organization: Not on file    Attends meetings of clubs or organizations: Not on file    Relationship status: Not on file  . Intimate partner violence    Fear of current or ex partner: Not on file    Emotionally abused: Not on file    Physically abused: Not on file    Forced sexual activity: Not on file  Other Topics Concern  . Not on file  Social History Narrative   5 children - 1 passed  away,    Lives alone - has alert    Objective:   Today's Vitals: BP (!) 170/68   Pulse 78   Temp 98 F (36.7 C)   Resp 18   Ht _0  (1.6 m)   Wt 144 lb (65.3 kg)   SpO2 99%   BMI 25.51 kg/m   Physical Exam Vitals signs reviewed.  Constitutional:      General: She is not in acute distress.    Appearance: Normal appearance. She is normal weight. She is not ill-appearing, toxic-appearing or diaphoretic.  HENT:     Head: Normocephalic and atraumatic.  Eyes:     General: No scleral icterus.       Right eye: No discharge.        Left eye: No discharge.     Conjunctiva/sclera: Conjunctivae normal.  Neck:     Musculoskeletal: Normal range of motion.  Cardiovascular:     Rate and Rhythm: Normal rate and regular rhythm.     Heart sounds: Normal heart sounds. No murmur. No friction rub. No gallop.   Pulmonary:     Effort: Pulmonary effort is normal. No respiratory distress.     Breath sounds: Normal breath sounds. No stridor. No wheezing, rhonchi or rales.  Musculoskeletal: Normal range of motion.  Skin:    General: Skin is warm and dry.     Capillary Refill: Capillary refill takes less than 2 seconds.  Neurological:     General: No focal deficit present.     Mental Status: She is alert and oriented to person, place, and time. Mental status is at baseline.     Gait: Gait abnormal (ambulates with cane).  Psychiatric:        Mood and  Affect: Mood normal.        Behavior: Behavior normal.        Thought Content: Thought content normal.        Judgment: Judgment normal.

## 2019-05-04 NOTE — Patient Instructions (Signed)

## 2019-05-05 LAB — CMP14+EGFR
ALT: 10 IU/L (ref 0–32)
AST: 13 IU/L (ref 0–40)
Albumin/Globulin Ratio: 1.5 (ref 1.2–2.2)
Albumin: 4.3 g/dL (ref 3.5–4.6)
Alkaline Phosphatase: 63 IU/L (ref 39–117)
BUN/Creatinine Ratio: 19 (ref 12–28)
BUN: 17 mg/dL (ref 10–36)
Bilirubin Total: 0.4 mg/dL (ref 0.0–1.2)
CO2: 25 mmol/L (ref 20–29)
Calcium: 9 mg/dL (ref 8.7–10.3)
Chloride: 102 mmol/L (ref 96–106)
Creatinine, Ser: 0.91 mg/dL (ref 0.57–1.00)
GFR calc Af Amer: 61 mL/min/{1.73_m2} (ref >59)
GFR calc non Af Amer: 53 mL/min/{1.73_m2} — ABNORMAL LOW (ref >59)
Globulin, Total: 2.8 g/dL (ref 1.5–4.5)
Glucose: 119 mg/dL — ABNORMAL HIGH (ref 65–99)
Potassium: 3.8 mmol/L (ref 3.5–5.2)
Sodium: 141 mmol/L (ref 134–144)
Total Protein: 7.1 g/dL (ref 6.0–8.5)

## 2019-05-09 ENCOUNTER — Other Ambulatory Visit: Payer: Self-pay | Admitting: Family Medicine

## 2019-05-09 ENCOUNTER — Encounter: Payer: Self-pay | Admitting: Family Medicine

## 2019-06-04 ENCOUNTER — Other Ambulatory Visit: Payer: Self-pay

## 2019-06-05 ENCOUNTER — Encounter: Payer: Self-pay | Admitting: Family Medicine

## 2019-06-05 ENCOUNTER — Ambulatory Visit (INDEPENDENT_AMBULATORY_CARE_PROVIDER_SITE_OTHER): Payer: Medicare Other | Admitting: Family Medicine

## 2019-06-05 DIAGNOSIS — I10 Essential (primary) hypertension: Secondary | ICD-10-CM | POA: Diagnosis not present

## 2019-06-05 MED ORDER — AMLODIPINE BESYLATE 10 MG PO TABS: 10.0000 mg | ORAL_TABLET | ORAL | 2 refills | Status: DC

## 2019-06-05 NOTE — Patient Instructions (Signed)

## 2019-06-05 NOTE — Progress Notes (Signed)
Assessment & Plan:  1. Essential hypertension, benign - Uncontrolled. Continue metoprolol 25 mg PO BID. Increased amlodipine from 5 mg to 10 mg PO QD. Education provided on the DASH diet. Patient is going to work hard to decrease her salt intake.  - amLODipine (NORVASC) 10 MG tablet; Take 1 tablet (10 mg total) by mouth every morning.  Dispense: 30 tablet; Refill: 2   Return in about 4 weeks (around 07/03/2019) for HTN.  Hendricks Limes, MSN, APRN, FNP-C [facility]  Subjective:    Patient ID: [name], female    DOB: January 05, 1920, 85 y.o.   MRN: [id number]  Patient Care Team: Loman Brooklyn, FNP as PCP - General (Family Medicine)   Chief Complaint:  Chief Complaint  Patient presents with  . Hypertension    4 week follow up    HPI: [name] is a 85 y.o. female presenting on 06/05/2019 for Hypertension (4 week follow up)  Hypertension: Patient here for follow-up of elevated blood pressure. She is not exercising and is not adherent to low salt diet.  Blood pressure is not well controlled at home. Cardiac symptoms none. Cardiovascular risk factors: advanced age (older than 31 for men, 18 for women), dyslipidemia and hypertension. Use of agents associated with hypertension: none. History of target organ damage: none.   New complaints: None  Social history:  Relevant past medical, surgical, family and social history reviewed and updated as indicated. Interim medical history since our last visit reviewed.  Allergies and medications reviewed and updated.  DATA REVIEWED: CHART IN EPIC  ROS: Negative unless specifically indicated above in HPI.    Current Outpatient Medications:  .  amLODipine (NORVASC) 10 MG tablet, Take 1 tablet (10 mg total) by mouth every morning., Disp: 30 tablet, Rfl: 2 .  aspirin EC 81 MG tablet, Take 81 mg by mouth daily., Disp: , Rfl:  .  esomeprazole (NEXIUM) 40 MG capsule, Take 1 capsule (40 mg total) by mouth  daily., Disp: 90 capsule, Rfl: 1 .  fluticasone (FLONASE) 50 MCG/ACT nasal spray, INSTILL 2 SPRAYS IN EACH NOSTRIL ONCE A DAY, Disp: 48 mL, Rfl: 1 .  KLOR-CON M20 20 MEQ tablet, Take 1 tablet (20 mEq total) by mouth daily., Disp: 90 tablet, Rfl: 1 .  metoprolol tartrate (LOPRESSOR) 25 MG tablet, Take 1 tablet (25 mg total) by mouth 2 (two) times daily., Disp: 180 tablet, Rfl: 1 .  polyethylene glycol (MIRALAX / GLYCOLAX) 17 g packet, Take 17 g by mouth daily as needed., Disp: , Rfl:    Allergies  Allergen Reactions  . Cozaar [Losartan] Swelling    Oral swelling    Past Medical History:  Diagnosis Date  . Arthralgia   . Breast cancer ([place])    left breast   . Constipation   . Dyspepsia   . GERD (gastroesophageal reflux disease)   . High cholesterol   . Hypertension   . NSVT (nonsustained ventricular tachycardia) ([facility])    Short run of monomorphic VT in setting of hypokalemia during 09/2011 hospitalization. EF 60-65% by echo 09/26/11  . Right bundle branch block   . SVT (supraventricular tachycardia) ([place])    Noted 09/2011 responsive to cardizem  . Vertigo     Past Surgical History:  Procedure Laterality Date  . BREAST LUMPECTOMY     left breast cancer years ago  . CHOLECYSTECTOMY    . EYE SURGERY     1- eye - cataract removal   . TONSILLECTOMY  Social History   Socioeconomic History  . Marital status: Widowed    Spouse name: Not on file  . Number of children: 4  . Years of education: Not on file  . Highest education level: Not on file  Occupational History  . Not on file  Social Needs  . Financial resource strain: Not on file  . Food insecurity    Worry: Not on file    Inability: Not on file  . Transportation needs    Medical: Not on file    Non-medical: Not on file  Tobacco Use  . Smoking status: Never Smoker  . Smokeless tobacco: Never Used  Substance and Sexual Activity  . Alcohol use: No  . Drug use: No  . Sexual activity: Not Currently    Birth  control/protection: Post-menopausal  Lifestyle  . Physical activity    Days per week: Not on file    Minutes per session: Not on file  . Stress: Not on file  Relationships  . Social Herbalist on phone: Not on file    Gets together: Not on file    Attends religious service: Not on file    Active member of club or organization: Not on file    Attends meetings of clubs or organizations: Not on file    Relationship status: Not on file  . Intimate partner violence    Fear of current or ex partner: Not on file    Emotionally abused: Not on file    Physically abused: Not on file    Forced sexual activity: Not on file  Other Topics Concern  . Not on file  Social History Narrative   5 children - 1 passed away,    Lives alone - has alert        Objective:    BP (!) 158/72 Comment: manual  Pulse 84   Temp (!) 97.5 F (36.4 C) (Temporal)   Ht 5\' 3"  (1.6 m)   Wt 142 lb 6.4 oz (64.6 kg)   SpO2 99%   BMI 25.23 kg/m   Physical Exam Vitals signs reviewed.  Constitutional:      General: She is not in acute distress.    Appearance: Normal appearance. She is normal weight. She is not ill-appearing, toxic-appearing or diaphoretic.  HENT:     Head: Normocephalic and atraumatic.  Eyes:     General: No scleral icterus.       Right eye: No discharge.        Left eye: No discharge.     Conjunctiva/sclera: Conjunctivae normal.  Neck:     Musculoskeletal: Normal range of motion.  Cardiovascular:     Rate and Rhythm: Normal rate and regular rhythm.     Heart sounds: Normal heart sounds. No murmur. No friction rub. No gallop.   Pulmonary:     Effort: Pulmonary effort is normal. No respiratory distress.     Breath sounds: Normal breath sounds. No stridor. No wheezing, rhonchi or rales.  Musculoskeletal: Normal range of motion.  Skin:    General: Skin is warm and dry.     Capillary Refill: Capillary refill takes less than 2 seconds.  Neurological:     General: No focal  deficit present.     Mental Status: She is alert and oriented to person, place, and time. Mental status is at baseline.  Psychiatric:        Mood and Affect: Mood normal.        Behavior:  Behavior normal.        Thought Content: Thought content normal.        Judgment: Judgment normal.     Lab Results  Component Value Date   TSH 1.741 08/19/2015   Lab Results  Component Value Date   WBC 3.3 (L) 04/11/2018   HGB 12.4 04/11/2018   HCT 38.9 04/11/2018   MCV 85.7 04/11/2018   PLT 151 04/11/2018   Lab Results  Component Value Date   NA 141 05/04/2019   K 3.8 05/04/2019   CO2 25 05/04/2019   GLUCOSE 119 (H) 05/04/2019   BUN 17 05/04/2019   CREATININE 0.91 05/04/2019   BILITOT 0.4 05/04/2019   ALKPHOS 63 05/04/2019   AST 13 05/04/2019   ALT 10 05/04/2019   PROT 7.1 05/04/2019   ALBUMIN 4.3 05/04/2019   CALCIUM 9.0 05/04/2019   ANIONGAP 6 04/11/2018   No results found for: CHOL No results found for: HDL No results found for: LDLCALC No results found for: TRIG No results found for: CHOLHDL No results found for: HGBA1C

## 2019-06-07 ENCOUNTER — Ambulatory Visit: Payer: Medicare Other | Admitting: Obstetrics & Gynecology

## 2019-06-08 ENCOUNTER — Telehealth: Payer: Self-pay | Admitting: Obstetrics & Gynecology

## 2019-06-08 NOTE — Telephone Encounter (Signed)

## 2019-06-11 ENCOUNTER — Other Ambulatory Visit: Payer: Self-pay

## 2019-06-11 ENCOUNTER — Ambulatory Visit (INDEPENDENT_AMBULATORY_CARE_PROVIDER_SITE_OTHER): Payer: Medicare Other | Admitting: Obstetrics & Gynecology

## 2019-06-11 ENCOUNTER — Encounter: Payer: Self-pay | Admitting: Obstetrics & Gynecology

## 2019-06-11 VITALS — BP 157/70 | HR 74 | Ht 63.0 in | Wt 141.0 lb

## 2019-06-11 DIAGNOSIS — N813 Complete uterovaginal prolapse: Secondary | ICD-10-CM

## 2019-06-11 DIAGNOSIS — Z4689 Encounter for fitting and adjustment of other specified devices: Secondary | ICD-10-CM

## 2019-06-11 NOTE — Progress Notes (Signed)
Chief Complaint  Patient presents with  . Pessary Check    Blood pressure (!) 157/70, pulse 74, height 5\' 3"  (1.6 m), weight 141 lb (64 kg).  [name] presents today for routine follow up related to her pessary.   She uses a Milex ring with support #4 She reports no vaginal discharge or vaginal bleeding.  Exam reveals no undue vaginal mucosal pressure of breakdown, no discharge and no vaginal bleeding.  The pessary is removed, cleaned and replaced without difficulty.    [name] will be sen back in 4 months for continued follow up.  Florian Buff, MD  06/11/2019 11:20 AM

## 2019-06-26 NOTE — Progress Notes (Signed)
Assessment & Plan:  1. Essential hypertension, benign - Uncontrolled.  I am not sure patient is taking her medication correctly as I see absolutely no improvement in blood pressure with an increase in amlodipine from 5 mg to 10 mg once daily.  Her caregiver that is with her also does not feel like she is taking her medication correctly.  We did get her son on the phone and I explained the situation to him.  He is going to go by the house either tomorrow or Thursday and make sure she has the correct medications and the correct dosage of those medications at home.  He will call me and let me know what he figures out.  We did discuss the option of having the pharmacy pack her pills for her.  I am hesitant to make any changes today given that we do not know if she is taking her medication correctly.  Patient would like to decrease her salt intake over the next week and return next week for a recheck. Education provided on the DASH diet.    Return in about 1 week (around 07/10/2019) for HTN.  Hendricks Limes, MSN, APRN, FNP-C [facility]  Subjective:    Patient ID: [name], female    DOB: [birth year], 85 y.o.   MRN: [id number]  Patient Care Team: Loman Brooklyn, FNP as PCP - General (Family Medicine)   Chief Complaint:  Chief Complaint  Patient presents with   Hypertension    4 week follow up    HPI: [name] is a 85 y.o. female presenting on 07/03/2019 for Hypertension (4 week follow up)  Hypertension: Patient here for follow-up of elevated blood pressure.   At her last visit she was continued on metoprolol 25 mg twice daily and her amlodipine was increased from 5 mg to 10 mg once daily.  She is not exercising and is not adherent to low salt diet.  Patient does not check blood pressure at home. Cardiac symptoms none. Cardiovascular risk factors: advanced age (older than 62 for men, 44 for women), dyslipidemia and hypertension. Use of agents associated  with hypertension: none. History of target organ damage: none.  New complaints: None  Social history:  Relevant past medical, surgical, family and social history reviewed and updated as indicated. Interim medical history since our last visit reviewed.  Allergies and medications reviewed and updated.  DATA REVIEWED: CHART IN EPIC  ROS: Negative unless specifically indicated above in HPI.    Current Outpatient Medications:    amLODipine (NORVASC) 10 MG tablet, Take 1 tablet (10 mg total) by mouth every morning., Disp: 30 tablet, Rfl: 2   aspirin EC 81 MG tablet, Take 81 mg by mouth daily., Disp: , Rfl:    esomeprazole (NEXIUM) 40 MG capsule, Take 1 capsule (40 mg total) by mouth daily., Disp: 90 capsule, Rfl: 1   fluticasone (FLONASE) 50 MCG/ACT nasal spray, INSTILL 2 SPRAYS IN EACH NOSTRIL ONCE A DAY, Disp: 48 mL, Rfl: 1   KLOR-CON M20 20 MEQ tablet, Take 1 tablet (20 mEq total) by mouth daily., Disp: 90 tablet, Rfl: 1   metoprolol tartrate (LOPRESSOR) 25 MG tablet, Take 1 tablet (25 mg total) by mouth 2 (two) times daily., Disp: 180 tablet, Rfl: 1   polyethylene glycol (MIRALAX / GLYCOLAX) 17 g packet, Take 17 g by mouth daily as needed., Disp: , Rfl:    Allergies  Allergen Reactions   Cozaar [Losartan] Swelling    Oral  swelling    Past Medical History:  Diagnosis Date   Arthralgia    Breast cancer ([place])    left breast    Constipation    Dyspepsia    GERD (gastroesophageal reflux disease)    High cholesterol    Hypertension    NSVT (nonsustained ventricular tachycardia) ([facility])    Short run of monomorphic VT in setting of hypokalemia during 09/2011 hospitalization. EF 60-65% by echo 09/26/11   Right bundle branch block    SVT (supraventricular tachycardia) ([place])    Noted 09/2011 responsive to cardizem   Vertigo     Past Surgical History:  Procedure Laterality Date   BREAST LUMPECTOMY     left breast cancer years ago   CHOLECYSTECTOMY     EYE  SURGERY     1- eye - cataract removal    TONSILLECTOMY      Social History   Socioeconomic History   Marital status: Widowed    Spouse name: Not on file   Number of children: 4   Years of education: Not on file   Highest education level: Not on file  Occupational History   Not on file  Social Needs   Financial resource strain: Not on file   Food insecurity    Worry: Not on file    Inability: Not on file   Transportation needs    Medical: Not on file    Non-medical: Not on file  Tobacco Use   Smoking status: Never Smoker   Smokeless tobacco: Never Used  Substance and Sexual Activity   Alcohol use: No   Drug use: No   Sexual activity: Not Currently    Birth control/protection: Post-menopausal  Lifestyle   Physical activity    Days per week: Not on file    Minutes per session: Not on file   Stress: Not on file  Relationships   Social connections    Talks on phone: Not on file    Gets together: Not on file    Attends religious service: Not on file    Active member of club or organization: Not on file    Attends meetings of clubs or organizations: Not on file    Relationship status: Not on file   Intimate partner violence    Fear of current or ex partner: Not on file    Emotionally abused: Not on file    Physically abused: Not on file    Forced sexual activity: Not on file  Other Topics Concern   Not on file  Social History Narrative   5 children - 1 passed away,    Lives alone - has alert        Objective:    BP (!) 160/72 Comment: manual   Pulse 72    Temp (!) 97.5 F (36.4 C) (Temporal)    Ht 5\' 3"  (1.6 m)    Wt 142 lb 3.2 oz (64.5 kg)    SpO2 98%    BMI 25.19 kg/m   Physical Exam Vitals signs reviewed.  Constitutional:      General: She is not in acute distress.    Appearance: Normal appearance. She is normal weight. She is not ill-appearing, toxic-appearing or diaphoretic.  HENT:     Head: Normocephalic and atraumatic.  Eyes:      General: No scleral icterus.       Right eye: No discharge.        Left eye: No discharge.     Conjunctiva/sclera: Conjunctivae  normal.  Neck:     Musculoskeletal: Normal range of motion.  Cardiovascular:     Rate and Rhythm: Normal rate and regular rhythm.     Heart sounds: Normal heart sounds. No murmur. No friction rub. No gallop.   Pulmonary:     Effort: Pulmonary effort is normal. No respiratory distress.     Breath sounds: Normal breath sounds. No stridor. No wheezing, rhonchi or rales.  Musculoskeletal: Normal range of motion.  Skin:    General: Skin is warm and dry.     Capillary Refill: Capillary refill takes less than 2 seconds.  Neurological:     General: No focal deficit present.     Mental Status: She is alert and oriented to person, place, and time. Mental status is at baseline.  Psychiatric:        Mood and Affect: Mood normal.        Behavior: Behavior normal.        Thought Content: Thought content normal.        Judgment: Judgment normal.     Lab Results  Component Value Date   TSH 1.741 08/19/2015   Lab Results  Component Value Date   WBC 3.3 (L) 04/11/2018   HGB 12.4 04/11/2018   HCT 38.9 04/11/2018   MCV 85.7 04/11/2018   PLT 151 04/11/2018   Lab Results  Component Value Date   NA 141 05/04/2019   K 3.8 05/04/2019   CO2 25 05/04/2019   GLUCOSE 119 (H) 05/04/2019   BUN 17 05/04/2019   CREATININE 0.91 05/04/2019   BILITOT 0.4 05/04/2019   ALKPHOS 63 05/04/2019   AST 13 05/04/2019   ALT 10 05/04/2019   PROT 7.1 05/04/2019   ALBUMIN 4.3 05/04/2019   CALCIUM 9.0 05/04/2019   ANIONGAP 6 04/11/2018   No results found for: CHOL No results found for: HDL No results found for: LDLCALC No results found for: TRIG No results found for: CHOLHDL No results found for: HGBA1C

## 2019-07-02 ENCOUNTER — Other Ambulatory Visit: Payer: Self-pay

## 2019-07-03 ENCOUNTER — Ambulatory Visit (INDEPENDENT_AMBULATORY_CARE_PROVIDER_SITE_OTHER): Payer: Medicare Other | Admitting: Family Medicine

## 2019-07-03 ENCOUNTER — Encounter: Payer: Self-pay | Admitting: Family Medicine

## 2019-07-03 VITALS — BP 160/72 | HR 72 | Temp 97.5°F | Ht 63.0 in | Wt 142.2 lb

## 2019-07-03 DIAGNOSIS — I10 Essential (primary) hypertension: Secondary | ICD-10-CM | POA: Diagnosis not present

## 2019-07-03 NOTE — Patient Instructions (Signed)

## 2019-07-04 ENCOUNTER — Telehealth: Payer: Self-pay | Admitting: Family Medicine

## 2019-07-04 NOTE — Telephone Encounter (Signed)
NV set up

## 2019-07-04 NOTE — Telephone Encounter (Signed)
If he does not want medication changes until she is seen again lets see her in two weeks.

## 2019-07-04 NOTE — Telephone Encounter (Signed)
Patients son states she is taking both the amlodipine 5mg  1 in the morning and 1 at night. So he had her to start taking both in the morning.  Patient son wants to know when we should have her come back in? Also if there is to be medication changes he would like for her to come in for a visit before any medications are changed or added.

## 2019-07-10 NOTE — Telephone Encounter (Signed)
Patient had a follow up appointment scheduled. 

## 2019-08-15 ENCOUNTER — Other Ambulatory Visit: Payer: Self-pay

## 2019-08-15 NOTE — Progress Notes (Signed)
Assessment & Plan:  1. Essential hypertension, benign - Well controlled on current regimen.  - BMP8+EGFR  2. Stage 3a chronic kidney disease - Stable; continue monitoring.  - BMP8+EGFR   Return in about 3 months (around 11/14/2019) for follow-up of chronic medication conditions.  Hendricks Limes, MSN, APRN, FNP-C [facility]  Subjective:    Patient ID: [name], female    DOB: [birth year], 86 y.o.   MRN: [id number]  Patient Care Team: Loman Brooklyn, FNP as PCP - General (Family Medicine)   Chief Complaint:  Chief Complaint  Patient presents with  . Hypertension    1 month follow up    HPI: [name] is a 86 y.o. female presenting on 08/16/2019 for Hypertension (1 month follow up)  She is accompanied by a different women who she reports is like a daughter to her, whom she is okay with being present.   Patient is here for follow-up of her high blood pressure.  No medication changes were made at her last visit as I was not certain patient was taking medication as prescribed.  Her son did get by her house and make sure she was taking the correct medications at the correct dosages.  This required him to adjust the way she was taking the amlodipine.  We discussed the option of having the pharmacy pack her pills for her which she is not doing.  Patient wanted to decrease her salt intake and see if that helped her blood pressure.  She has been doing well with a low-salt diet. She has checked her BP at home and gives readings of 136/66 and 145/74.    New complaints: None  Social history:  Relevant past medical, surgical, family and social history reviewed and updated as indicated. Interim medical history since our last visit reviewed.  Allergies and medications reviewed and updated.  DATA REVIEWED: CHART IN EPIC  ROS: Negative unless specifically indicated above in HPI.    Current Outpatient Medications:  .  amLODipine (NORVASC) 10 MG  tablet, Take 1 tablet (10 mg total) by mouth every morning., Disp: 30 tablet, Rfl: 2 .  aspirin EC 81 MG tablet, Take 81 mg by mouth daily., Disp: , Rfl:  .  esomeprazole (NEXIUM) 40 MG capsule, Take 1 capsule (40 mg total) by mouth daily., Disp: 90 capsule, Rfl: 1 .  fluticasone (FLONASE) 50 MCG/ACT nasal spray, INSTILL 2 SPRAYS IN EACH NOSTRIL ONCE A DAY, Disp: 48 mL, Rfl: 1 .  KLOR-CON M20 20 MEQ tablet, Take 1 tablet (20 mEq total) by mouth daily., Disp: 90 tablet, Rfl: 1 .  metoprolol tartrate (LOPRESSOR) 25 MG tablet, Take 1 tablet (25 mg total) by mouth 2 (two) times daily., Disp: 180 tablet, Rfl: 1 .  polyethylene glycol (MIRALAX / GLYCOLAX) 17 g packet, Take 17 g by mouth daily as needed., Disp: , Rfl:    Allergies  Allergen Reactions  . Cozaar [Losartan] Swelling    Oral swelling    Past Medical History:  Diagnosis Date  . Arthralgia   . Breast cancer ([place])    left breast   . Constipation   . Dyspepsia   . GERD (gastroesophageal reflux disease)   . High cholesterol   . Hypertension   . NSVT (nonsustained ventricular tachycardia) ([facility])    Short run of monomorphic VT in setting of hypokalemia during 09/2011 hospitalization. EF 60-65% by echo 09/26/11  . Right bundle branch block   . SVT (supraventricular tachycardia) ([place])  Noted 09/2011 responsive to cardizem  . Vertigo     Past Surgical History:  Procedure Laterality Date  . BREAST LUMPECTOMY     left breast cancer years ago  . CHOLECYSTECTOMY    . EYE SURGERY     1- eye - cataract removal   . TONSILLECTOMY      Social History   Socioeconomic History  . Marital status: Widowed    Spouse name: Not on file  . Number of children: 4  . Years of education: Not on file  . Highest education level: Not on file  Occupational History  . Not on file  Tobacco Use  . Smoking status: Never Smoker  . Smokeless tobacco: Never Used  Substance and Sexual Activity  . Alcohol use: No  . Drug use: No  . Sexual activity: Not  Currently    Birth control/protection: Post-menopausal  Other Topics Concern  . Not on file  Social History Narrative   5 children - 1 passed away,    Lives alone - has alert   Social Determinants of Health   Financial Resource Strain:   . Difficulty of Paying Living Expenses: Not on file  Food Insecurity:   . Worried About Charity fundraiser in the Last Year: Not on file  . Ran Out of Food in the Last Year: Not on file  Transportation Needs:   . Lack of Transportation (Medical): Not on file  . Lack of Transportation (Non-Medical): Not on file  Physical Activity:   . Days of Exercise per Week: Not on file  . Minutes of Exercise per Session: Not on file  Stress:   . Feeling of Stress : Not on file  Social Connections:   . Frequency of Communication with Friends and Family: Not on file  . Frequency of Social Gatherings with Friends and Family: Not on file  . Attends Religious Services: Not on file  . Active Member of Clubs or Organizations: Not on file  . Attends Archivist Meetings: Not on file  . Marital Status: Not on file  Intimate Partner Violence:   . Fear of Current or Ex-Partner: Not on file  . Emotionally Abused: Not on file  . Physically Abused: Not on file  . Sexually Abused: Not on file        Objective:    BP (!) 144/61   Pulse 73   Temp 98.2 F (36.8 C) (Temporal)   Ht '5\' 3"'  (1.6 m)   Wt 138 lb 6.4 oz (62.8 kg)   SpO2 100%   BMI 24.52 kg/m   Physical Exam Vitals reviewed.  Constitutional:      General: She is not in acute distress.    Appearance: Normal appearance. She is normal weight. She is not ill-appearing, toxic-appearing or diaphoretic.  HENT:     Head: Normocephalic and atraumatic.  Eyes:     General: No scleral icterus.       Right eye: No discharge.        Left eye: No discharge.     Conjunctiva/sclera: Conjunctivae normal.  Cardiovascular:     Rate and Rhythm: Normal rate and regular rhythm.     Heart sounds: Normal  heart sounds. No murmur. No friction rub. No gallop.   Pulmonary:     Effort: Pulmonary effort is normal. No respiratory distress.     Breath sounds: Normal breath sounds. No stridor. No wheezing, rhonchi or rales.  Musculoskeletal:  General: Normal range of motion.     Cervical back: Normal range of motion.  Skin:    General: Skin is warm and dry.     Capillary Refill: Capillary refill takes less than 2 seconds.  Neurological:     General: No focal deficit present.     Mental Status: She is alert and oriented to person, place, and time. Mental status is at baseline.     Gait: Gait abnormal (ambultes with cane).  Psychiatric:        Mood and Affect: Mood normal.        Behavior: Behavior normal.        Thought Content: Thought content normal.        Judgment: Judgment normal.     Lab Results  Component Value Date   TSH 1.741 08/19/2015   Lab Results  Component Value Date   WBC 3.3 (L) 04/11/2018   HGB 12.4 04/11/2018   HCT 38.9 04/11/2018   MCV 85.7 04/11/2018   PLT 151 04/11/2018   Lab Results  Component Value Date   NA 141 05/04/2019   K 3.8 05/04/2019   CO2 25 05/04/2019   GLUCOSE 119 (H) 05/04/2019   BUN 17 05/04/2019   CREATININE 0.91 05/04/2019   BILITOT 0.4 05/04/2019   ALKPHOS 63 05/04/2019   AST 13 05/04/2019   ALT 10 05/04/2019   PROT 7.1 05/04/2019   ALBUMIN 4.3 05/04/2019   CALCIUM 9.0 05/04/2019   ANIONGAP 6 04/11/2018   No results found for: CHOL No results found for: HDL No results found for: LDLCALC No results found for: TRIG No results found for: CHOLHDL No results found for: HGBA1C

## 2019-08-16 ENCOUNTER — Encounter: Payer: Self-pay | Admitting: Family Medicine

## 2019-08-16 ENCOUNTER — Ambulatory Visit (INDEPENDENT_AMBULATORY_CARE_PROVIDER_SITE_OTHER): Payer: Medicare Other

## 2019-08-16 ENCOUNTER — Ambulatory Visit (INDEPENDENT_AMBULATORY_CARE_PROVIDER_SITE_OTHER): Payer: Medicare Other | Admitting: Family Medicine

## 2019-08-16 VITALS — BP 144/61 | HR 73 | Temp 98.2°F | Ht 63.0 in | Wt 138.4 lb

## 2019-08-16 DIAGNOSIS — Z780 Asymptomatic menopausal state: Secondary | ICD-10-CM | POA: Diagnosis not present

## 2019-08-16 DIAGNOSIS — N1831 Chronic kidney disease, stage 3a: Secondary | ICD-10-CM

## 2019-08-16 DIAGNOSIS — I10 Essential (primary) hypertension: Secondary | ICD-10-CM | POA: Diagnosis not present

## 2019-08-16 LAB — BMP8+EGFR
BUN/Creatinine Ratio: 17 (ref 12–28)
BUN: 16 mg/dL (ref 10–36)
CO2: 22 mmol/L (ref 20–29)
Calcium: 9.3 mg/dL (ref 8.7–10.3)
Chloride: 99 mmol/L (ref 96–106)
Creatinine, Ser: 0.93 mg/dL (ref 0.57–1.00)
GFR calc Af Amer: 60 mL/min/{1.73_m2} (ref >59)
GFR calc non Af Amer: 52 mL/min/{1.73_m2} — ABNORMAL LOW (ref >59)
Glucose: 114 mg/dL — ABNORMAL HIGH (ref 65–99)
Potassium: 4.2 mmol/L (ref 3.5–5.2)
Sodium: 136 mmol/L (ref 134–144)

## 2019-10-05 ENCOUNTER — Ambulatory Visit: Payer: Medicare Other | Admitting: Obstetrics & Gynecology

## 2019-10-09 ENCOUNTER — Ambulatory Visit: Payer: Medicare Other | Admitting: Obstetrics & Gynecology

## 2019-10-16 ENCOUNTER — Ambulatory Visit: Payer: Medicare Other | Admitting: Obstetrics & Gynecology

## 2019-10-18 ENCOUNTER — Other Ambulatory Visit: Payer: Self-pay | Admitting: Family Medicine

## 2019-10-18 DIAGNOSIS — I10 Essential (primary) hypertension: Secondary | ICD-10-CM

## 2019-10-19 ENCOUNTER — Ambulatory Visit: Payer: Medicare Other | Admitting: Obstetrics & Gynecology

## 2019-11-12 ENCOUNTER — Telehealth: Payer: Self-pay | Admitting: Obstetrics & Gynecology

## 2019-11-12 NOTE — Telephone Encounter (Signed)

## 2019-11-13 ENCOUNTER — Other Ambulatory Visit: Payer: Self-pay

## 2019-11-13 ENCOUNTER — Encounter: Payer: Self-pay | Admitting: Obstetrics & Gynecology

## 2019-11-13 ENCOUNTER — Ambulatory Visit: Payer: Medicare Other | Admitting: Obstetrics & Gynecology

## 2019-11-13 ENCOUNTER — Ambulatory Visit (INDEPENDENT_AMBULATORY_CARE_PROVIDER_SITE_OTHER): Payer: Medicare Other | Admitting: Obstetrics & Gynecology

## 2019-11-13 VITALS — BP 162/72 | HR 79 | Ht 66.0 in | Wt 139.0 lb

## 2019-11-13 DIAGNOSIS — Z466 Encounter for fitting and adjustment of urinary device: Secondary | ICD-10-CM | POA: Diagnosis not present

## 2019-11-13 DIAGNOSIS — N813 Complete uterovaginal prolapse: Secondary | ICD-10-CM

## 2019-11-13 DIAGNOSIS — Z4689 Encounter for fitting and adjustment of other specified devices: Secondary | ICD-10-CM

## 2019-11-13 NOTE — Progress Notes (Signed)
Chief Complaint  Patient presents with  . Pessary Check    Blood pressure (!) 162/72, pulse 79, height 5\' 6"  (1.676 m), weight 139 lb (63 kg).  [name] presents today for routine follow up related to her pessary.   She uses a Milex ring with support #4 She reports no vaginal discharge or vaginal bleeding.  Exam reveals no undue vaginal mucosal pressure of breakdown, no discharge and no vaginal bleeding.  The pessary is removed, cleaned and replaced without difficulty.    [name] will be sen back in 4 months for continued follow up.  Florian Buff, MD  11/13/2019 11:44 AM

## 2019-11-14 ENCOUNTER — Ambulatory Visit: Payer: Medicare Other | Admitting: Family Medicine

## 2019-11-19 ENCOUNTER — Other Ambulatory Visit: Payer: Self-pay | Admitting: Family Medicine

## 2019-11-19 NOTE — Progress Notes (Signed)
Assessment & Plan:  1. Essential hypertension, benign - Well controlled on current regimen.  - CMP14+EGFR - metoprolol tartrate (LOPRESSOR) 25 MG tablet; Take 1 tablet (25 mg total) by mouth 2 (two) times daily.  Dispense: 180 tablet; Refill: 1  2. Mixed hyperlipidemia - Well controlled on current regimen.  - CMP14+EGFR - Lipid panel  3. Gastroesophageal reflux disease without esophagitis - Well controlled on current regimen.  - esomeprazole (NEXIUM) 40 MG capsule; Take 1 capsule (40 mg total) by mouth daily.  Dispense: 90 capsule; Refill: 1  4. PSVT (paroxysmal supraventricular tachycardia) ([place]) - Well controlled on current regimen.  - metoprolol tartrate (LOPRESSOR) 25 MG tablet; Take 1 tablet (25 mg total) by mouth 2 (two) times daily.  Dispense: 180 tablet; Refill: 1   Return in about 3 months (around 02/19/2020) for follow-up of chronic medication conditions.  Hendricks Limes, MSN, APRN, FNP-C [facility]  Subjective:    Patient ID: [name], female    DOB: Nov 29, 1919, 86 y.o.   MRN: [id number]  Patient Care Team: Loman Brooklyn, FNP as PCP - General (Family Medicine)   Chief Complaint:  Chief Complaint  Patient presents with  . Hypertension    3 month follow up    HPI: [name] is a 86 y.o. female presenting on 11/20/2019 for Hypertension (3 month follow up)  She has no complaints or concerns today.  New complaints: None  Social history:  Relevant past medical, surgical, family and social history reviewed and updated as indicated. Interim medical history since our last visit reviewed.  Allergies and medications reviewed and updated.  DATA REVIEWED: CHART IN EPIC  ROS: Negative unless specifically indicated above in HPI.    Current Outpatient Medications:  .  amLODipine (NORVASC) 10 MG tablet, TAKE 1 TABLET BY MOUTH EVERY DAY IN THE MORNING, Disp: 90 tablet, Rfl: 0 .  aspirin EC 81 MG tablet, Take 81 mg by  mouth daily., Disp: , Rfl:  .  esomeprazole (NEXIUM) 40 MG capsule, Take 1 capsule (40 mg total) by mouth daily., Disp: 90 capsule, Rfl: 1 .  fluticasone (FLONASE) 50 MCG/ACT nasal spray, INSTILL 2 SPRAYS IN EACH NOSTRIL ONCE A DAY, Disp: 48 mL, Rfl: 6 .  KLOR-CON M20 20 MEQ tablet, Take 1 tablet (20 mEq total) by mouth daily., Disp: 90 tablet, Rfl: 1 .  metoprolol tartrate (LOPRESSOR) 25 MG tablet, Take 1 tablet (25 mg total) by mouth 2 (two) times daily., Disp: 180 tablet, Rfl: 1 .  polyethylene glycol (MIRALAX / GLYCOLAX) 17 g packet, Take 17 g by mouth daily as needed., Disp: , Rfl:    Allergies  Allergen Reactions  . Cozaar [Losartan] Swelling    Oral swelling    Past Medical History:  Diagnosis Date  . Arthralgia   . Breast cancer ([place])    left breast   . Constipation   . Dyspepsia   . GERD (gastroesophageal reflux disease)   . High cholesterol   . Hypertension   . NSVT (nonsustained ventricular tachycardia) ([facility])    Short run of monomorphic VT in setting of hypokalemia during 09/2011 hospitalization. EF 60-65% by echo 09/26/11  . Right bundle branch block   . SVT (supraventricular tachycardia) ([place])    Noted 09/2011 responsive to cardizem  . Vertigo     Past Surgical History:  Procedure Laterality Date  . BREAST LUMPECTOMY     left breast cancer years ago  . CHOLECYSTECTOMY    . EYE  SURGERY     1- eye - cataract removal   . TONSILLECTOMY      Social History   Socioeconomic History  . Marital status: Widowed    Spouse name: Not on file  . Number of children: 4  . Years of education: Not on file  . Highest education level: Not on file  Occupational History  . Not on file  Tobacco Use  . Smoking status: Never Smoker  . Smokeless tobacco: Never Used  Substance and Sexual Activity  . Alcohol use: No  . Drug use: No  . Sexual activity: Not Currently    Birth control/protection: Post-menopausal  Other Topics Concern  . Not on file  Social History Narrative   5  children - 1 passed away,    Lives alone - has alert   Social Determinants of Health   Financial Resource Strain:   . Difficulty of Paying Living Expenses:   Food Insecurity:   . Worried About Charity fundraiser in the Last Year:   . Arboriculturist in the Last Year:   Transportation Needs:   . Film/video editor (Medical):   Marland Kitchen Lack of Transportation (Non-Medical):   Physical Activity:   . Days of Exercise per Week:   . Minutes of Exercise per Session:   Stress:   . Feeling of Stress :   Social Connections:   . Frequency of Communication with Friends and Family:   . Frequency of Social Gatherings with Friends and Family:   . Attends Religious Services:   . Active Member of Clubs or Organizations:   . Attends Archivist Meetings:   Marland Kitchen Marital Status:   Intimate Partner Violence:   . Fear of Current or Ex-Partner:   . Emotionally Abused:   Marland Kitchen Physically Abused:   . Sexually Abused:         Objective:    BP 128/70 Comment: manual  Pulse 88   Temp (!) 97.3 F (36.3 C) (Temporal)   Ht '5\' 6"'$  (1.676 m)   Wt 137 lb 6.4 oz (62.3 kg)   SpO2 100%   BMI 22.18 kg/m   Wt Readings from Last 3 Encounters:  11/20/19 137 lb 6.4 oz (62.3 kg)  11/13/19 139 lb (63 kg)  08/16/19 138 lb 6.4 oz (62.8 kg)    Physical Exam Vitals reviewed.  Constitutional:      General: She is not in acute distress.    Appearance: Normal appearance. She is normal weight. She is not ill-appearing, toxic-appearing or diaphoretic.  HENT:     Head: Normocephalic and atraumatic.     Right Ear: Decreased hearing noted.     Left Ear: Decreased hearing noted.  Eyes:     General: No scleral icterus.       Right eye: No discharge.        Left eye: No discharge.     Conjunctiva/sclera: Conjunctivae normal.  Cardiovascular:     Rate and Rhythm: Normal rate and regular rhythm.     Heart sounds: Normal heart sounds. No murmur. No friction rub. No gallop.   Pulmonary:     Effort: Pulmonary  effort is normal. No respiratory distress.     Breath sounds: Normal breath sounds. No stridor. No wheezing, rhonchi or rales.  Musculoskeletal:        General: Normal range of motion.     Cervical back: Normal range of motion.  Skin:    General: Skin is warm and dry.  Capillary Refill: Capillary refill takes less than 2 seconds.  Neurological:     General: No focal deficit present.     Mental Status: She is alert and oriented to person, place, and time. Mental status is at baseline.     Gait: Gait abnormal (ambulates with cane).  Psychiatric:        Mood and Affect: Mood normal.        Behavior: Behavior normal.        Thought Content: Thought content normal.        Judgment: Judgment normal.     Lab Results  Component Value Date   TSH 1.741 08/19/2015   Lab Results  Component Value Date   WBC 3.3 (L) 04/11/2018   HGB 12.4 04/11/2018   HCT 38.9 04/11/2018   MCV 85.7 04/11/2018   PLT 151 04/11/2018   Lab Results  Component Value Date   NA 136 08/16/2019   K 4.2 08/16/2019   CO2 22 08/16/2019   GLUCOSE 114 (H) 08/16/2019   BUN 16 08/16/2019   CREATININE 0.93 08/16/2019   BILITOT 0.4 05/04/2019   ALKPHOS 63 05/04/2019   AST 13 05/04/2019   ALT 10 05/04/2019   PROT 7.1 05/04/2019   ALBUMIN 4.3 05/04/2019   CALCIUM 9.3 08/16/2019   ANIONGAP 6 04/11/2018   No results found for: CHOL No results found for: HDL No results found for: LDLCALC No results found for: TRIG No results found for: CHOLHDL No results found for: HGBA1C

## 2019-11-20 ENCOUNTER — Other Ambulatory Visit: Payer: Self-pay

## 2019-11-20 ENCOUNTER — Encounter: Payer: Self-pay | Admitting: Family Medicine

## 2019-11-20 ENCOUNTER — Ambulatory Visit (INDEPENDENT_AMBULATORY_CARE_PROVIDER_SITE_OTHER): Payer: Medicare Other | Admitting: Family Medicine

## 2019-11-20 VITALS — BP 128/70 | HR 88 | Temp 97.3°F | Ht 66.0 in | Wt 137.4 lb

## 2019-11-20 DIAGNOSIS — I471 Supraventricular tachycardia: Secondary | ICD-10-CM | POA: Diagnosis not present

## 2019-11-20 DIAGNOSIS — E782 Mixed hyperlipidemia: Secondary | ICD-10-CM | POA: Diagnosis not present

## 2019-11-20 DIAGNOSIS — I10 Essential (primary) hypertension: Secondary | ICD-10-CM

## 2019-11-20 DIAGNOSIS — K219 Gastro-esophageal reflux disease without esophagitis: Secondary | ICD-10-CM | POA: Diagnosis not present

## 2019-11-20 MED ORDER — METOPROLOL TARTRATE 25 MG PO TABS: 25.0000 mg | ORAL_TABLET | Freq: Two times a day (BID) | ORAL | 1 refills | Status: DC

## 2019-11-20 MED ORDER — KLOR-CON M20 20 MEQ PO TBCR: 20.0000 meq | EXTENDED_RELEASE_TABLET | Freq: Every day | ORAL | 1 refills | Status: DC

## 2019-11-20 MED ORDER — ESOMEPRAZOLE MAGNESIUM 40 MG PO CPDR: 40.0000 mg | DELAYED_RELEASE_CAPSULE | Freq: Every day | ORAL | 1 refills | Status: DC

## 2019-11-21 LAB — SPECIMEN STATUS

## 2019-11-23 LAB — CBC WITH DIFFERENTIAL/PLATELET
Basophils Absolute: 0 10*3/uL (ref 0.0–0.2)
Basos: 1 %
EOS (ABSOLUTE): 0 10*3/uL (ref 0.0–0.4)
Eos: 1 %
Hematocrit: 37.5 % (ref 34.0–46.6)
Hemoglobin: 12.1 g/dL (ref 11.1–15.9)
Immature Grans (Abs): 0 10*3/uL (ref 0.0–0.1)
Immature Granulocytes: 0 %
Lymphocytes Absolute: 1 10*3/uL (ref 0.7–3.1)
Lymphs: 27 %
MCH: 27.3 pg (ref 26.6–33.0)
MCHC: 32.3 g/dL (ref 31.5–35.7)
MCV: 85 fL (ref 79–97)
Monocytes Absolute: 0.4 10*3/uL (ref 0.1–0.9)
Monocytes: 9 %
Neutrophils Absolute: 2.4 10*3/uL (ref 1.4–7.0)
Neutrophils: 62 %
Platelets: 168 10*3/uL (ref 150–450)
RBC: 4.44 x10E6/uL (ref 3.77–5.28)
RDW: 15.1 % (ref 11.7–15.4)
WBC: 3.8 10*3/uL (ref 3.4–10.8)

## 2019-11-23 LAB — CMP14+EGFR
ALT: 17 IU/L (ref 0–32)
AST: 19 IU/L (ref 0–40)
Albumin/Globulin Ratio: 1.4 (ref 1.2–2.2)
Albumin: 4.2 g/dL (ref 3.5–4.6)
Alkaline Phosphatase: 62 IU/L (ref 39–117)
BUN/Creatinine Ratio: 19 (ref 12–28)
BUN: 17 mg/dL (ref 10–36)
Bilirubin Total: 0.4 mg/dL (ref 0.0–1.2)
CO2: 23 mmol/L (ref 20–29)
Calcium: 9.5 mg/dL (ref 8.7–10.3)
Chloride: 100 mmol/L (ref 96–106)
Creatinine, Ser: 0.88 mg/dL (ref 0.57–1.00)
GFR calc Af Amer: 64 mL/min/{1.73_m2} (ref >59)
GFR calc non Af Amer: 55 mL/min/{1.73_m2} — ABNORMAL LOW (ref >59)
Globulin, Total: 3 g/dL (ref 1.5–4.5)
Glucose: 136 mg/dL — ABNORMAL HIGH (ref 65–99)
Potassium: 4.1 mmol/L (ref 3.5–5.2)
Sodium: 140 mmol/L (ref 134–144)
Total Protein: 7.2 g/dL (ref 6.0–8.5)

## 2019-11-23 LAB — LIPID PANEL
Chol/HDL Ratio: 2.7 ratio (ref 0.0–4.4)
Cholesterol, Total: 158 mg/dL (ref 100–199)
HDL: 58 mg/dL (ref >39)
LDL Chol Calc (NIH): 90 mg/dL (ref 0–99)
Triglycerides: 48 mg/dL (ref 0–149)
VLDL Cholesterol Cal: 10 mg/dL (ref 5–40)

## 2019-11-23 LAB — SPECIMEN STATUS REPORT

## 2019-11-26 ENCOUNTER — Telehealth: Payer: Self-pay | Admitting: Family Medicine

## 2019-11-26 NOTE — Telephone Encounter (Signed)
Patient aware of results.

## 2020-01-10 ENCOUNTER — Other Ambulatory Visit: Payer: Self-pay | Admitting: Family Medicine

## 2020-01-10 DIAGNOSIS — I10 Essential (primary) hypertension: Secondary | ICD-10-CM

## 2020-02-21 ENCOUNTER — Ambulatory Visit (INDEPENDENT_AMBULATORY_CARE_PROVIDER_SITE_OTHER): Payer: Medicare Other | Admitting: Family Medicine

## 2020-02-21 ENCOUNTER — Encounter: Payer: Self-pay | Admitting: Family Medicine

## 2020-02-21 ENCOUNTER — Other Ambulatory Visit: Payer: Self-pay

## 2020-02-21 VITALS — BP 128/76 | HR 139 | Temp 97.0°F | Ht 66.0 in | Wt 135.4 lb

## 2020-02-21 DIAGNOSIS — E782 Mixed hyperlipidemia: Secondary | ICD-10-CM | POA: Diagnosis not present

## 2020-02-21 DIAGNOSIS — R110 Nausea: Secondary | ICD-10-CM

## 2020-02-21 DIAGNOSIS — N1831 Chronic kidney disease, stage 3a: Secondary | ICD-10-CM | POA: Diagnosis not present

## 2020-02-21 DIAGNOSIS — K219 Gastro-esophageal reflux disease without esophagitis: Secondary | ICD-10-CM

## 2020-02-21 DIAGNOSIS — I471 Supraventricular tachycardia, unspecified: Secondary | ICD-10-CM

## 2020-02-21 DIAGNOSIS — K5900 Constipation, unspecified: Secondary | ICD-10-CM

## 2020-02-21 DIAGNOSIS — I10 Essential (primary) hypertension: Secondary | ICD-10-CM

## 2020-02-21 NOTE — Patient Instructions (Signed)
TUMS, Maalox, or Mylanta for sick spells.

## 2020-02-21 NOTE — Progress Notes (Signed)
Assessment & Plan:  1. Essential hypertension, benign - Well controlled on current regimen.   2. PSVT (paroxysmal supraventricular tachycardia) ([place]) - Well controlled on current regimen.  Patient has not yet had medication today.  3. Mixed hyperlipidemia - Well controlled on current regimen.  Last lipid panel 11/20/2019.  4. Stage 3a chronic kidney disease - BMP8+EGFR  5. Gastroesophageal reflux disease without esophagitis - Well controlled on current regimen.   6. Constipation, unspecified constipation type - Well controlled on current regimen.   7. Nausea without vomiting - Encouraged to try Tums, Maalox, or Mylanta when she feels this way.   Return in about 3 months (around 05/23/2020) for follow-up of chronic medication conditions.  Hendricks Limes, MSN, APRN, FNP-C [facility]  Subjective:    Patient ID: [name], female    DOB: [birth year], 86 y.o.   MRN: [id number]  Patient Care Team: Loman Brooklyn, FNP as PCP - General (Family Medicine)   Chief Complaint:  Chief Complaint  Patient presents with  . Hypertension    3 month follow up of chronic medical conditions  . Nausea    Patient states she has been feeling "sick" in the mornings for awhile.  She thinks it is from her Nexium.    HPI: [name] is a 86 y.o. female presenting on 02/21/2020 for Hypertension (3 month follow up of chronic medical conditions) and Nausea (Patient states she has been feeling "sick" in the mornings for awhile.  She thinks it is from her Nexium.)  Patient is doing well.  Her heart rate is elevated today, but she reports she has not yet had her medication this morning.  New complaints: Patient reports that she has "little sick spells" which she later describes as feeling nauseated randomly.  They could happen at any point throughout the day.  She feels extra gaseous when this occurs.   Social history:  Relevant past medical, surgical,  family and social history reviewed and updated as indicated. Interim medical history since our last visit reviewed.  Allergies and medications reviewed and updated.  DATA REVIEWED: CHART IN EPIC  ROS: Negative unless specifically indicated above in HPI.    Current Outpatient Medications:  .  amLODipine (NORVASC) 10 MG tablet, TAKE 1 TABLET BY MOUTH EVERY DAY IN THE MORNING, Disp: 90 tablet, Rfl: 0 .  aspirin EC 81 MG tablet, Take 81 mg by mouth daily., Disp: , Rfl:  .  esomeprazole (NEXIUM) 40 MG capsule, Take 1 capsule (40 mg total) by mouth daily., Disp: 90 capsule, Rfl: 1 .  fluticasone (FLONASE) 50 MCG/ACT nasal spray, INSTILL 2 SPRAYS IN EACH NOSTRIL ONCE A DAY, Disp: 48 mL, Rfl: 6 .  KLOR-CON M20 20 MEQ tablet, Take 1 tablet (20 mEq total) by mouth daily., Disp: 90 tablet, Rfl: 1 .  metoprolol tartrate (LOPRESSOR) 25 MG tablet, Take 1 tablet (25 mg total) by mouth 2 (two) times daily., Disp: 180 tablet, Rfl: 1 .  polyethylene glycol (MIRALAX / GLYCOLAX) 17 g packet, Take 17 g by mouth daily as needed., Disp: , Rfl:    Allergies  Allergen Reactions  . Cozaar [Losartan] Swelling    Oral swelling    Past Medical History:  Diagnosis Date  . Arthralgia   . Breast cancer ([place])    left breast   . Constipation   . Dyspepsia   . GERD (gastroesophageal reflux disease)   . High cholesterol   . Hypertension   . NSVT (nonsustained  ventricular tachycardia) ([place])    Short run of monomorphic VT in setting of hypokalemia during 09/2011 hospitalization. EF 60-65% by echo 09/26/11  . Right bundle branch block   . SVT (supraventricular tachycardia) ([place])    Noted 09/2011 responsive to cardizem  . Vertigo     Past Surgical History:  Procedure Laterality Date  . BREAST LUMPECTOMY     left breast cancer years ago  . CHOLECYSTECTOMY    . EYE SURGERY     1- eye - cataract removal   . TONSILLECTOMY      Social History   Socioeconomic History  . Marital status: Widowed    Spouse name:  Not on file  . Number of children: 4  . Years of education: Not on file  . Highest education level: Not on file  Occupational History  . Not on file  Tobacco Use  . Smoking status: Never Smoker  . Smokeless tobacco: Never Used  Vaping Use  . Vaping Use: Never used  Substance and Sexual Activity  . Alcohol use: No  . Drug use: No  . Sexual activity: Not Currently    Birth control/protection: Post-menopausal  Other Topics Concern  . Not on file  Social History Narrative   5 children - 1 passed away,    Lives alone - has alert   Social Determinants of Health   Financial Resource Strain:   . Difficulty of Paying Living Expenses:   Food Insecurity:   . Worried About Charity fundraiser in the Last Year:   . Arboriculturist in the Last Year:   Transportation Needs:   . Film/video editor (Medical):   Marland Kitchen Lack of Transportation (Non-Medical):   Physical Activity:   . Days of Exercise per Week:   . Minutes of Exercise per Session:   Stress:   . Feeling of Stress :   Social Connections:   . Frequency of Communication with Friends and Family:   . Frequency of Social Gatherings with Friends and Family:   . Attends Religious Services:   . Active Member of Clubs or Organizations:   . Attends Archivist Meetings:   Marland Kitchen Marital Status:   Intimate Partner Violence:   . Fear of Current or Ex-Partner:   . Emotionally Abused:   Marland Kitchen Physically Abused:   . Sexually Abused:         Objective:    BP 128/76   Pulse (!) 139   Temp (!) 97 F (36.1 C) (Temporal)   Ht '5\' 6"'  (1.676 m)   Wt 135 lb 6.4 oz (61.4 kg)   SpO2 100%   BMI 21.85 kg/m   Wt Readings from Last 3 Encounters:  02/21/20 135 lb 6.4 oz (61.4 kg)  11/20/19 137 lb 6.4 oz (62.3 kg)  11/13/19 139 lb (63 kg)    Physical Exam Vitals reviewed.  Constitutional:      General: She is not in acute distress.    Appearance: Normal appearance. She is normal weight. She is not ill-appearing, toxic-appearing or  diaphoretic.  HENT:     Head: Normocephalic and atraumatic.  Eyes:     General: No scleral icterus.       Right eye: No discharge.        Left eye: No discharge.     Conjunctiva/sclera: Conjunctivae normal.  Cardiovascular:     Rate and Rhythm: Regular rhythm. Tachycardia present.     Heart sounds: Normal heart sounds. No murmur heard.  No friction rub. No gallop.   Pulmonary:     Effort: Pulmonary effort is normal. No respiratory distress.     Breath sounds: Normal breath sounds. No stridor. No wheezing, rhonchi or rales.  Musculoskeletal:        General: Normal range of motion.     Cervical back: Normal range of motion.  Skin:    General: Skin is warm and dry.     Capillary Refill: Capillary refill takes less than 2 seconds.  Neurological:     General: No focal deficit present.     Mental Status: She is alert and oriented to person, place, and time. Mental status is at baseline.  Psychiatric:        Mood and Affect: Mood normal.        Behavior: Behavior normal.        Thought Content: Thought content normal.        Judgment: Judgment normal.     Lab Results  Component Value Date   TSH 1.741 08/19/2015   Lab Results  Component Value Date   WBC WILL FOLLOW 11/20/2019   WBC 3.8 11/20/2019   HGB WILL FOLLOW 11/20/2019   HGB 12.1 11/20/2019   HCT WILL FOLLOW 11/20/2019   HCT 37.5 11/20/2019   MCV WILL FOLLOW 11/20/2019   MCV 85 11/20/2019   PLT WILL FOLLOW 11/20/2019   PLT 168 11/20/2019   Lab Results  Component Value Date   NA 140 11/20/2019   K 4.1 11/20/2019   CO2 23 11/20/2019   GLUCOSE 136 (H) 11/20/2019   BUN 17 11/20/2019   CREATININE 0.88 11/20/2019   BILITOT 0.4 11/20/2019   ALKPHOS 62 11/20/2019   AST 19 11/20/2019   ALT 17 11/20/2019   PROT 7.2 11/20/2019   ALBUMIN 4.2 11/20/2019   CALCIUM 9.5 11/20/2019   ANIONGAP 6 04/11/2018   Lab Results  Component Value Date   CHOL 158 11/20/2019   Lab Results  Component Value Date   HDL 58  11/20/2019   Lab Results  Component Value Date   LDLCALC 90 11/20/2019   Lab Results  Component Value Date   TRIG 48 11/20/2019   Lab Results  Component Value Date   CHOLHDL 2.7 11/20/2019   No results found for: HGBA1C

## 2020-02-22 LAB — BMP8+EGFR
BUN/Creatinine Ratio: 17 (ref 12–28)
BUN: 17 mg/dL (ref 10–36)
CO2: 23 mmol/L (ref 20–29)
Calcium: 9.2 mg/dL (ref 8.7–10.3)
Chloride: 100 mmol/L (ref 96–106)
Creatinine, Ser: 1 mg/dL (ref 0.57–1.00)
GFR calc Af Amer: 55 mL/min/{1.73_m2} — ABNORMAL LOW (ref >59)
GFR calc non Af Amer: 47 mL/min/{1.73_m2} — ABNORMAL LOW (ref >59)
Glucose: 151 mg/dL — ABNORMAL HIGH (ref 65–99)
Potassium: 4.4 mmol/L (ref 3.5–5.2)
Sodium: 138 mmol/L (ref 134–144)

## 2020-03-04 ENCOUNTER — Emergency Department (HOSPITAL_COMMUNITY): Payer: Medicare Other

## 2020-03-04 ENCOUNTER — Encounter (HOSPITAL_COMMUNITY): Payer: Self-pay

## 2020-03-04 ENCOUNTER — Inpatient Hospital Stay (HOSPITAL_COMMUNITY)
Admission: EM | Admit: 2020-03-04 | Discharge: 2020-03-06 | DRG: 310 | Disposition: A | Discharge status: Home Health | Payer: Medicare Other | Attending: Family Medicine | Admitting: Family Medicine

## 2020-03-04 ENCOUNTER — Other Ambulatory Visit: Payer: Self-pay

## 2020-03-04 DIAGNOSIS — Z9049 Acquired absence of other specified parts of digestive tract: Secondary | ICD-10-CM

## 2020-03-04 DIAGNOSIS — R1084 Generalized abdominal pain: Secondary | ICD-10-CM

## 2020-03-04 DIAGNOSIS — Z853 Personal history of malignant neoplasm of breast: Secondary | ICD-10-CM

## 2020-03-04 DIAGNOSIS — Z831 Family history of other infectious and parasitic diseases: Secondary | ICD-10-CM

## 2020-03-04 DIAGNOSIS — K5900 Constipation, unspecified: Secondary | ICD-10-CM | POA: Diagnosis not present

## 2020-03-04 DIAGNOSIS — K219 Gastro-esophageal reflux disease without esophagitis: Secondary | ICD-10-CM | POA: Diagnosis not present

## 2020-03-04 DIAGNOSIS — Z888 Allergy status to other drugs, medicaments and biological substances status: Secondary | ICD-10-CM

## 2020-03-04 DIAGNOSIS — E7800 Pure hypercholesterolemia, unspecified: Secondary | ICD-10-CM | POA: Diagnosis present

## 2020-03-04 DIAGNOSIS — I272 Pulmonary hypertension, unspecified: Secondary | ICD-10-CM | POA: Diagnosis present

## 2020-03-04 DIAGNOSIS — I083 Combined rheumatic disorders of mitral, aortic and tricuspid valves: Secondary | ICD-10-CM | POA: Diagnosis present

## 2020-03-04 DIAGNOSIS — Z515 Encounter for palliative care: Secondary | ICD-10-CM

## 2020-03-04 DIAGNOSIS — R000 Tachycardia, unspecified: Secondary | ICD-10-CM

## 2020-03-04 DIAGNOSIS — E162 Hypoglycemia, unspecified: Secondary | ICD-10-CM | POA: Diagnosis present

## 2020-03-04 DIAGNOSIS — I10 Essential (primary) hypertension: Secondary | ICD-10-CM

## 2020-03-04 DIAGNOSIS — Z8249 Family history of ischemic heart disease and other diseases of the circulatory system: Secondary | ICD-10-CM

## 2020-03-04 DIAGNOSIS — Z7189 Other specified counseling: Secondary | ICD-10-CM

## 2020-03-04 DIAGNOSIS — Z20822 Contact with and (suspected) exposure to covid-19: Secondary | ICD-10-CM | POA: Diagnosis present

## 2020-03-04 DIAGNOSIS — Z66 Do not resuscitate: Secondary | ICD-10-CM | POA: Diagnosis not present

## 2020-03-04 DIAGNOSIS — D696 Thrombocytopenia, unspecified: Secondary | ICD-10-CM | POA: Diagnosis present

## 2020-03-04 DIAGNOSIS — R918 Other nonspecific abnormal finding of lung field: Secondary | ICD-10-CM | POA: Diagnosis present

## 2020-03-04 DIAGNOSIS — Z79899 Other long term (current) drug therapy: Secondary | ICD-10-CM

## 2020-03-04 DIAGNOSIS — I471 Supraventricular tachycardia, unspecified: Secondary | ICD-10-CM

## 2020-03-04 DIAGNOSIS — Z7982 Long term (current) use of aspirin: Secondary | ICD-10-CM

## 2020-03-04 DIAGNOSIS — I4510 Unspecified right bundle-branch block: Secondary | ICD-10-CM | POA: Diagnosis present

## 2020-03-04 DIAGNOSIS — E785 Hyperlipidemia, unspecified: Secondary | ICD-10-CM | POA: Diagnosis present

## 2020-03-04 DIAGNOSIS — N1831 Chronic kidney disease, stage 3a: Secondary | ICD-10-CM | POA: Diagnosis present

## 2020-03-04 DIAGNOSIS — R7303 Prediabetes: Secondary | ICD-10-CM | POA: Diagnosis present

## 2020-03-04 DIAGNOSIS — I129 Hypertensive chronic kidney disease with stage 1 through stage 4 chronic kidney disease, or unspecified chronic kidney disease: Secondary | ICD-10-CM | POA: Diagnosis present

## 2020-03-04 DIAGNOSIS — R55 Syncope and collapse: Secondary | ICD-10-CM

## 2020-03-04 LAB — COMPREHENSIVE METABOLIC PANEL
ALT: 18 U/L (ref 0–44)
AST: 19 U/L (ref 15–41)
Albumin: 3.9 g/dL (ref 3.5–5.0)
Alkaline Phosphatase: 48 U/L (ref 38–126)
Anion gap: 11 (ref 5–15)
BUN: 18 mg/dL (ref 8–23)
CO2: 24 mmol/L (ref 22–32)
Calcium: 8.9 mg/dL (ref 8.9–10.3)
Chloride: 100 mmol/L (ref 98–111)
Creatinine, Ser: 0.91 mg/dL (ref 0.44–1.00)
GFR calc Af Amer: >60 mL/min (ref >60)
GFR calc non Af Amer: 53 mL/min — ABNORMAL LOW (ref >60)
Glucose, Bld: 137 mg/dL — ABNORMAL HIGH (ref 70–99)
Potassium: 4 mmol/L (ref 3.5–5.1)
Sodium: 135 mmol/L (ref 135–145)
Total Bilirubin: 1.4 mg/dL — ABNORMAL HIGH (ref 0.3–1.2)
Total Protein: 7.7 g/dL (ref 6.5–8.1)

## 2020-03-04 LAB — URINALYSIS, COMPLETE (UACMP) WITH MICROSCOPIC
Bacteria, UA: NONE SEEN
Bilirubin Urine: NEGATIVE
Glucose, UA: NEGATIVE mg/dL
Hgb urine dipstick: NEGATIVE
Ketones, ur: 5 mg/dL — AB
Leukocytes,Ua: NEGATIVE
Nitrite: NEGATIVE
Protein, ur: 30 mg/dL — AB
Specific Gravity, Urine: >1.046 — ABNORMAL HIGH (ref 1.005–1.030)
pH: 6 (ref 5.0–8.0)

## 2020-03-04 LAB — SARS CORONAVIRUS 2 BY RT PCR (HOSPITAL ORDER, PERFORMED IN ~~LOC~~ HOSPITAL LAB): SARS Coronavirus 2: NEGATIVE

## 2020-03-04 LAB — BLOOD GAS, VENOUS
Acid-Base Excess: 1.1 mmol/L (ref 0.0–2.0)
Bicarbonate: 24.6 mmol/L (ref 20.0–28.0)
FIO2: 21
O2 Saturation: 67.8 %
Patient temperature: 37
pCO2, Ven: 43.1 mmHg — ABNORMAL LOW (ref 44.0–60.0)
pH, Ven: 7.389 (ref 7.250–7.430)
pO2, Ven: 39.7 mmHg (ref 32.0–45.0)

## 2020-03-04 LAB — CBC WITH DIFFERENTIAL/PLATELET
Abs Immature Granulocytes: 0.01 10*3/uL (ref 0.00–0.07)
Basophils Absolute: 0 10*3/uL (ref 0.0–0.1)
Basophils Relative: 0 %
Eosinophils Absolute: 0 10*3/uL (ref 0.0–0.5)
Eosinophils Relative: 0 %
HCT: 39.7 % (ref 36.0–46.0)
Hemoglobin: 12.5 g/dL (ref 12.0–15.0)
Immature Granulocytes: 0 %
Lymphocytes Relative: 9 %
Lymphs Abs: 0.7 10*3/uL (ref 0.7–4.0)
MCH: 27.4 pg (ref 26.0–34.0)
MCHC: 31.5 g/dL (ref 30.0–36.0)
MCV: 86.9 fL (ref 80.0–100.0)
Monocytes Absolute: 0.5 10*3/uL (ref 0.1–1.0)
Monocytes Relative: 7 %
Neutro Abs: 6 10*3/uL (ref 1.7–7.7)
Neutrophils Relative %: 84 %
Platelets: 142 10*3/uL — ABNORMAL LOW (ref 150–400)
RBC: 4.57 MIL/uL (ref 3.87–5.11)
RDW: 15.6 % — ABNORMAL HIGH (ref 11.5–15.5)
WBC: 7.2 10*3/uL (ref 4.0–10.5)
nRBC: 0 % (ref 0.0–0.2)

## 2020-03-04 LAB — LACTIC ACID, PLASMA
Lactic Acid, Venous: 1.8 mmol/L (ref 0.5–1.9)
Lactic Acid, Venous: 1.6 mmol/L (ref 0.5–1.9)

## 2020-03-04 LAB — TROPONIN I (HIGH SENSITIVITY)
Troponin I (High Sensitivity): 30 ng/L — ABNORMAL HIGH (ref <18)
Troponin I (High Sensitivity): 34 ng/L — ABNORMAL HIGH (ref <18)

## 2020-03-04 LAB — FOLATE: Folate: 32.8 ng/mL (ref >5.9)

## 2020-03-04 LAB — TSH: TSH: 1.567 u[IU]/mL (ref 0.350–4.500)

## 2020-03-04 LAB — VITAMIN B12: Vitamin B-12: 435 pg/mL (ref 180–914)

## 2020-03-04 MED ORDER — METOPROLOL TARTRATE 50 MG PO TABS
50.0000 mg | ORAL_TABLET | Freq: Two times a day (BID) | ORAL | Status: DC
  Administered 2020-03-04: 50 mg via ORAL
  Filled 2020-03-04: qty 1

## 2020-03-04 MED ORDER — ASPIRIN EC 81 MG PO TBEC
81.0000 mg | DELAYED_RELEASE_TABLET | Freq: Every day | ORAL | Status: DC
  Administered 2020-03-05 – 2020-03-06 (×2): 81 mg via ORAL
  Filled 2020-03-04 (×2): qty 1

## 2020-03-04 MED ORDER — ONDANSETRON HCL 4 MG/2ML IJ SOLN
4.0000 mg | Freq: Once | INTRAMUSCULAR | Status: AC
  Administered 2020-03-04: 4 mg via INTRAVENOUS
  Filled 2020-03-04: qty 2

## 2020-03-04 MED ORDER — SODIUM CHLORIDE 0.9 % IV BOLUS
1000.0000 mL | Freq: Once | INTRAVENOUS | Status: AC
  Administered 2020-03-04: 1000 mL via INTRAVENOUS

## 2020-03-04 MED ORDER — PANTOPRAZOLE SODIUM 40 MG PO TBEC
80.0000 mg | DELAYED_RELEASE_TABLET | Freq: Every day | ORAL | Status: DC
  Administered 2020-03-05 – 2020-03-06 (×2): 80 mg via ORAL
  Filled 2020-03-04 (×2): qty 2

## 2020-03-04 MED ORDER — METOPROLOL TARTRATE 5 MG/5ML IV SOLN
2.5000 mg | Freq: Once | INTRAVENOUS | Status: AC
  Administered 2020-03-04: 2.5 mg via INTRAVENOUS
  Filled 2020-03-04: qty 5

## 2020-03-04 MED ORDER — FLUTICASONE PROPIONATE 50 MCG/ACT NA SUSP
2.0000 | Freq: Every day | NASAL | Status: DC
  Administered 2020-03-05 – 2020-03-06 (×2): 2 via NASAL
  Filled 2020-03-04 (×3): qty 16

## 2020-03-04 MED ORDER — ACETAMINOPHEN 325 MG PO TABS: 650.0000 mg | ORAL_TABLET | Freq: Four times a day (QID) | ORAL | Status: DC | PRN

## 2020-03-04 MED ORDER — DILTIAZEM HCL-DEXTROSE 125-5 MG/125ML-% IV SOLN (PREMIX)
5.0000 mg/h | INTRAVENOUS | Status: DC
  Administered 2020-03-04: 5 mg/h via INTRAVENOUS
  Filled 2020-03-04: qty 125

## 2020-03-04 MED ORDER — ONDANSETRON HCL 4 MG PO TABS: 4.0000 mg | ORAL_TABLET | Freq: Four times a day (QID) | ORAL | Status: DC | PRN

## 2020-03-04 MED ORDER — POLYETHYLENE GLYCOL 3350 17 G PO PACK
17.0000 g | PACK | Freq: Every day | ORAL | Status: DC
  Administered 2020-03-05 – 2020-03-06 (×2): 17 g via ORAL
  Filled 2020-03-04 (×2): qty 1

## 2020-03-04 MED ORDER — ENOXAPARIN SODIUM 40 MG/0.4ML ~~LOC~~ SOLN
40.0000 mg | SUBCUTANEOUS | Status: DC
  Administered 2020-03-05 – 2020-03-06 (×2): 40 mg via SUBCUTANEOUS
  Filled 2020-03-04 (×2): qty 0.4

## 2020-03-04 MED ORDER — BISACODYL 10 MG RE SUPP: 10.0000 mg | Freq: Once | RECTAL | Status: DC

## 2020-03-04 MED ORDER — IOHEXOL 300 MG/ML  SOLN
100.0000 mL | Freq: Once | INTRAMUSCULAR | Status: AC | PRN
  Administered 2020-03-04: 100 mL via INTRAVENOUS

## 2020-03-04 MED ORDER — ACETAMINOPHEN 650 MG RE SUPP: 650.0000 mg | Freq: Four times a day (QID) | RECTAL | Status: DC | PRN

## 2020-03-04 MED ORDER — ONDANSETRON HCL 4 MG/2ML IJ SOLN
4.0000 mg | Freq: Four times a day (QID) | INTRAMUSCULAR | Status: DC | PRN
  Administered 2020-03-05 (×2): 4 mg via INTRAVENOUS
  Filled 2020-03-04 (×2): qty 2

## 2020-03-04 NOTE — ED Notes (Signed)
Pt HR elevated. Coverage notified. Waiting for response.

## 2020-03-04 NOTE — ED Notes (Signed)
New EKG given to Dr for tachycardia.

## 2020-03-04 NOTE — ED Notes (Signed)
Pt states that she did not enjoy her dinner. Michela Pitcher it was too cold. Gave pt a sandwich meal. Pt happy and eating.

## 2020-03-04 NOTE — ED Provider Notes (Signed)
[facility] EMERGENCY DEPARTMENT Provider Note   CSN: [id number] Arrival date & time: 03/04/20  6720     History Chief Complaint  Patient presents with  . Abdominal Pain    [name] is a 86 y.o. female.  HPI Patient seen by me at 9:55 AM-presents for evaluation of nausea with abdominal pain.  She states the abdominal pain is generalized, and she feels her abdomen may be some swelling.  She is tachycardic on arrival and has a history of PSVT.  Patient states she has not eaten much for couple of days because of nausea.  She is unsure if she had a bowel movement yesterday or not.  She denies any diarrhea or constipation.  She states that she lives alone and she called the ambulance this morning because she was feeling poorly.  She denies recent cough, focal weakness or paresthesia.  She states she is taking her usual medications, without relief.    Past Medical History:  Diagnosis Date  . Arthralgia   . Breast cancer ([place])    left breast   . Constipation   . Dyspepsia   . GERD (gastroesophageal reflux disease)   . High cholesterol   . Hypertension   . NSVT (nonsustained ventricular tachycardia) ([facility])    Short run of monomorphic VT in setting of hypokalemia during 09/2011 hospitalization. EF 60-65% by echo 09/26/11  . Right bundle branch block   . SVT (supraventricular tachycardia) ([place])    Noted 09/2011 responsive to cardizem  . Vertigo     Patient Active Problem List   Diagnosis Date Noted  . SVT (supraventricular tachycardia) ([place]) 03/04/2020  . Stage 3a chronic kidney disease 02/21/2020  . Constipation 05/04/2019  . Arthralgia of right hip 04/21/2015  . GERD (gastroesophageal reflux disease) 11/26/2014  . Hyperlipidemia 01/01/2014  . PSVT (paroxysmal supraventricular tachycardia) ([place]) 09/26/2011  . Right bundle branch block 09/26/2011  . Essential hypertension, benign 09/26/2011    Past Surgical History:  Procedure Laterality Date  . BREAST LUMPECTOMY      left breast cancer years ago  . CHOLECYSTECTOMY    . EYE SURGERY     1- eye - cataract removal   . TONSILLECTOMY       OB History    Gravida  5   Para  5   Term  5   Preterm      AB      Living  4     SAB      TAB      Ectopic      Multiple      Live Births  5           Family History  Problem Relation Age of Onset  . Hypertension Mother   . Tuberculosis Father   . Hypertension Father     Social History   Tobacco Use  . Smoking status: Never Smoker  . Smokeless tobacco: Never Used  Vaping Use  . Vaping Use: Never used  Substance Use Topics  . Alcohol use: No  . Drug use: No    Home Medications Prior to Admission medications   Medication Sig Start Date End Date Taking? Authorizing Provider  amLODipine (NORVASC) 10 MG tablet TAKE 1 TABLET BY MOUTH EVERY DAY IN THE MORNING 01/10/20  Yes Loman Brooklyn, FNP  aspirin EC 81 MG tablet Take 81 mg by mouth daily.   Yes [provider]  esomeprazole (NEXIUM) 40 MG capsule Take 1 capsule (40 mg  total) by mouth daily. 11/20/19  Yes Loman Brooklyn, FNP  fluticasone (FLONASE) 50 MCG/ACT nasal spray INSTILL 2 SPRAYS IN EACH NOSTRIL ONCE A DAY 11/19/19  Yes Hendricks Limes F, FNP  KLOR-CON M20 20 MEQ tablet Take 1 tablet (20 mEq total) by mouth daily. 11/20/19  Yes Loman Brooklyn, FNP  metoprolol tartrate (LOPRESSOR) 25 MG tablet Take 1 tablet (25 mg total) by mouth 2 (two) times daily. 11/20/19  Yes Hendricks Limes F, FNP  polyethylene glycol (MIRALAX / GLYCOLAX) 17 g packet Take 17 g by mouth daily as needed.   Yes [provider]    Allergies    Cozaar [losartan]  Review of Systems   Review of Systems  All other systems reviewed and are negative.   Physical Exam Updated Vital Signs BP (!) 148/76   Pulse (!) 139   Temp 98.5 F (36.9 C) (Oral)   Resp (!) 31   Ht 5\' 6"  (1.676 m)   Wt 61.2 kg   SpO2 97%   BMI 21.79 kg/m   Physical Exam Vitals and nursing note reviewed.    Constitutional:      General: She is not in acute distress.    Appearance: She is well-developed. She is not ill-appearing, toxic-appearing or diaphoretic.     Comments: Elderly, somewhat frail, alert and conversant.  HENT:     Head: Normocephalic and atraumatic.     Right Ear: External ear normal.     Left Ear: External ear normal.  Eyes:     Conjunctiva/sclera: Conjunctivae normal.     Pupils: Pupils are equal, round, and reactive to light.  Neck:     Trachea: Phonation normal.  Cardiovascular:     Rate and Rhythm: Normal rate and regular rhythm.     Heart sounds: Normal heart sounds.  Pulmonary:     Effort: Pulmonary effort is normal.     Breath sounds: Normal breath sounds.  Abdominal:     General: There is distension.     Palpations: Abdomen is soft. There is no mass.     Tenderness: There is abdominal tenderness. There is no guarding or rebound.  Musculoskeletal:        General: Normal range of motion.     Cervical back: Normal range of motion and neck supple.  Skin:    General: Skin is warm and dry.  Neurological:     Mental Status: She is alert and oriented to person, place, and time.     Cranial Nerves: No cranial nerve deficit.     Sensory: No sensory deficit.     Motor: No abnormal muscle tone.     Coordination: Coordination normal.  Psychiatric:        Mood and Affect: Mood normal.        Behavior: Behavior normal.        Thought Content: Thought content normal.        Judgment: Judgment normal.     ED Results / Procedures / Treatments   Labs (all labs ordered are listed, but only abnormal results are displayed) Labs Reviewed  BLOOD GAS, VENOUS - Abnormal; Notable for the following components:      Result Value   pCO2, Ven 43.1 (*)    All other components within normal limits  CBC WITH DIFFERENTIAL/PLATELET - Abnormal; Notable for the following components:   RDW 15.6 (*)    Platelets 142 (*)    All other components within normal limits   COMPREHENSIVE METABOLIC PANEL -  Abnormal; Notable for the following components:   Glucose, Bld 137 (*)    Total Bilirubin 1.4 (*)    GFR calc non Af Amer 53 (*)    All other components within normal limits  TROPONIN I (HIGH SENSITIVITY) - Abnormal; Notable for the following components:   Troponin I (High Sensitivity) 30 (*)    All other components within normal limits  TROPONIN I (HIGH SENSITIVITY) - Abnormal; Notable for the following components:   Troponin I (High Sensitivity) 34 (*)    All other components within normal limits  SARS CORONAVIRUS 2 BY RT PCR (HOSPITAL ORDER, [facility])  LACTIC ACID, PLASMA  LACTIC ACID, PLASMA  URINALYSIS, ROUTINE W REFLEX MICROSCOPIC    EKG EKG Interpretation  Date/Time:  Tuesday March 04 2020 09:50:19 EDT Ventricular Rate:  148 PR Interval:    QRS Duration: 132 QT Interval:  314 QTC Calculation: 492 R Axis:   -70 Text Interpretation: Wide QRS tachycardia Left axis deviation Right bundle branch block Anterior infarct , age undetermined Abnormal ECG Since last tracing rate faster sinus tachycardia suspected Otherwise no significant change Confirmed by Daleen Bo [phone]) on 03/04/2020 9:56:49 AM   Radiology CT Abdomen Pelvis W Contrast  Result Date: 03/04/2020 CLINICAL DATA:  Abdominal pain and nausea EXAM: CT ABDOMEN AND PELVIS WITH CONTRAST TECHNIQUE: Multidetector CT imaging of the abdomen and pelvis was performed using the standard protocol following bolus administration of intravenous contrast. CONTRAST:  172mL OMNIPAQUE IOHEXOL 300 MG/ML  SOLN COMPARISON:  August 03, 2015 FINDINGS: Lower chest: Cardiomegaly with RIGHT atrial enlargement. Bibasilar tiny pulmonary nodules are unchanged comparison to prior. Mild RIGHT lower lobe centrilobular nodularity, likely infectious/inflammatory in etiology (series 5, image 5). Hepatobiliary: Hepatic steatosis. Status post cholecystectomy. Unchanged dilation of the common bile  duct, most likely due to post cholecystectomy state. Pancreas: No adjacent inflammatory changes.  No suspicious masses. Spleen: Normal in size without focal abnormality. Adrenals/Urinary Tract: Unchanged wall thickening of the adrenal glands. The kidneys enhance symmetrically. Subcentimeter hypodense lesions are too small to accurately characterize. No hydronephrosis. Bladder is distended. Stomach/Bowel: Small hiatal hernia. Moderate colonic stool burden predominantly within the distended RIGHT hemicolon. The appendix is unremarkable. No bowel obstruction. Vascular/Lymphatic: Aortic atherosclerosis. No enlarged abdominal or pelvic lymph nodes. Reproductive: Pessary. Revisualization of the dominant fibroid of the fundus measuring 6.6 cm. There is fluid within the endometrial canal, decreased in comparison to prior. Differential considerations include a necrotic fibroid. Hypodense fluid measures approximately 13 mm in thickness. Other: No free fluid. Musculoskeletal: Osteopenia. Mild wedging of L1, increased in comparison to prior. IMPRESSION: 1. Moderate colonic stool burden predominantly within the distended RIGHT hemicolon. 2. Mild wedging of L1, increased in comparison to prior study. Correlate with point tenderness. 3. Mild RIGHT lower lobe centrilobular nodularity, likely infectious/inflammatory in etiology. 4. There is fluid within the endometrial canal, decreased in comparison to prior. Differential considerations include a necrotic fibroid. Recommend correlation with pelvic ultrasound on a nonemergent basis. Aortic Atherosclerosis (ICD10-I70.0). Electronically Signed   By: Valentino Saxon MD   On: 03/04/2020 12:24   DG Chest Portable 1 View  Result Date: 03/04/2020 CLINICAL DATA:  Pain EXAM: PORTABLE CHEST 1 VIEW COMPARISON:  May 04, 2014 FINDINGS: The cardiomediastinal silhouette is unchanged and enlarged in contour.Atherosclerotic calcifications of the tortuous thoracic aorta. No pleural effusion.  No pneumothorax. Biapical pleuroparenchymal scarring, unchanged. Scattered bibasilar reticulonodular opacities. Status post cholecystectomy. LEFT breast surgical clips. Multilevel degenerative changes of the thoracic spine. IMPRESSION: Minimal scattered  bibasilar reticulonodular opacities. Differential considerations include aspiration versus infection versus atelectasis. Electronically Signed   By: Valentino Saxon MD   On: 03/04/2020 12:54    Procedures .Critical Care Performed by: Daleen Bo, MD Authorized by: Daleen Bo, MD   Critical care provider statement:    Critical care time (minutes):  50   Critical care start time:  03/04/2020 9:50 AM   Critical care end time:  03/04/2020 1:24 PM   Critical care time was exclusive of:  Separately billable procedures and treating other patients   Critical care was necessary to treat or prevent imminent or life-threatening deterioration of the following conditions:  Cardiac failure   Critical care was time spent personally by me on the following activities:  Blood draw for specimens, development of treatment plan with patient or surrogate, discussions with consultants, evaluation of patient's response to treatment, examination of patient, obtaining history from patient or surrogate, ordering and performing treatments and interventions, ordering and review of laboratory studies, pulse oximetry, re-evaluation of patient's condition, review of old charts and ordering and review of radiographic studies   (including critical care time)  Medications Ordered in ED Medications  diltiazem (CARDIZEM) 125 mg in dextrose 5% 125 mL (1 mg/mL) infusion (has no administration in time range)  sodium chloride 0.9 % bolus 1,000 mL (0 mLs Intravenous Stopped 03/04/20 1202)  ondansetron (ZOFRAN) injection 4 mg (4 mg Intravenous Given 03/04/20 1043)  iohexol (OMNIPAQUE) 300 MG/ML solution 100 mL (100 mLs Intravenous Contrast Given 03/04/20 1146)  metoprolol tartrate  (LOPRESSOR) injection 2.5 mg (2.5 mg Intravenous Given 03/04/20 1230)    ED Course  I have reviewed the triage vital signs and the nursing notes.  Pertinent labs & imaging results that were available during my care of the patient were reviewed by me and considered in my medical decision making (see chart for details).  Clinical Course as of Mar 05 1323  Tue Mar 04, 2020  1226 Abnormal, mild elevation  Troponin I (High Sensitivity)(!) [EW]  1226 Normal except PCO2 low  Blood gas, venous(!) [EW]  1226 Normal  Lactic acid, plasma [EW]  1226 Normal except glucose high, total bilirubin high, GFR low  Comprehensive metabolic panel(!) [EW]  7408 Patient was given Lopressor, few minutes ago, and he had transient lowering of heart rate from about 135 to125.  There was no apparent irregularity, and no change in the cardiac monitor appearance.  Specifically, no P waves could be elucidated.  Patient remained comfortable during this maneuver.   [EW]  1241 Patient son is now here and states that patient typically "gets like this," when she is "constipated."  He states that she usually takes MiraLAX, for it but he is not sure that she is taking it.  Patient does live alone and has someone looking in on on her daily.  Most people have not noticed anything unusual about her recently.   [EW]  1247 Normal  Lactic acid, plasma [EW]  1300 Repeat EKG, now heart rate eighty-eight, with sinus rhythm versus atrial flutter 3-1 block.   [EW]  1323 Per radiologist, nonspecific opacities.  DG Chest Portable 1 View [EW]    Clinical Course User Index [EW] Daleen Bo, MD   MDM Rules/Calculators/A&P                           Patient Vitals for the past 24 hrs:  BP Temp Temp src Pulse Resp SpO2 Height Weight  03/04/20 1218 -- -- -- Marland Kitchen  139 (!) 31 97 % -- --  03/04/20 1214 -- -- -- (!) 142 (!) 33 97 % -- --  03/04/20 1204 -- -- -- (!) 145 (!) 26 93 % -- --  03/04/20 1203 -- -- -- (!) 142 (!) 22 95 % -- --   03/04/20 1148 -- -- -- (!) 148 (!) 30 97 % -- --  03/04/20 1144 -- -- -- (!) 147 (!) 21 98 % -- --  03/04/20 1133 -- -- -- (!) 145 (!) 25 98 % -- --  03/04/20 1118 -- -- -- (!) 144 (!) 30 100 % -- --  03/04/20 1103 (!) 148/76 -- -- 92 (!) 32 100 % -- --  03/04/20 1048 -- -- -- 91 (!) 30 98 % -- --  03/04/20 1033 -- -- -- (!) 105 (!) 34 92 % -- --  03/04/20 1029 -- -- -- 97 (!) 31 97 % -- --  03/04/20 1027 (!) 152/69 -- -- -- (!) 33 -- -- --  03/04/20 0950 120/78 98.5 F (36.9 C) Oral (!) 148 18 99 % -- --  03/04/20 0946 -- -- -- -- -- -- 5\' 6"  (1.676 m) 61.2 kg    12:26 PM Reevaluation with update and discussion. After initial assessment and treatment, an updated evaluation reveals she is alert, comfortable.  Remote you dressed at work. Daleen Bo   Medical Decision Making:  This patient is presenting for evaluation of abdominal discomfort, which does require a range of treatment options, and is a complaint that involves a moderate risk of morbidity and mortality. The differential diagnoses include abdominal infection, urinary tract infection, Covid infection, metabolic instability. I decided to review old records, and in summary white female, with intermittent constipation in the past, presenting with abdominal discomfort.  I obtained additional historical information from her son at the bedside..  Clinical Laboratory Tests Ordered, included Covid test, lactate, CBC, Metabolic panel, Urinalysis and Troponin testing. Review indicates reassuring vitals, with the exception of mild elevation of troponin.  Urinalysis delayed for catheter sample. Radiologic Tests Ordered, included CT abdomen pelvis, chest x-ray.  I independently Visualized: Radiographic images, which show abdominal constipation, no other acute abnormalities; chest x-ray with nonspecific opacities  Cardiac Monitor Tracing which shows nonspecific tachycardia    Critical Interventions-clinical evaluation, laboratory  testing, CT imaging, IV fluids, reassessment, Lopressor IV for rate control, reassessment   GALADRIEL SHROFF was evaluated in Emergency Department on 03/04/2020 for the symptoms described in the history of present illness. She was evaluated in the context of the global COVID-19 pandemic, which necessitated consideration that the patient might be at risk for infection with the SARS-CoV-2 virus that causes COVID-19. Institutional protocols and algorithms that pertain to the evaluation of patients at risk for COVID-19 are in a state of rapid change based on information released by regulatory bodies including the CDC and federal and state organizations. These policies and algorithms were followed during the patient's care in the ED.  After These Interventions, the Patient was reevaluated and was found with persistent tachycardia requiring further evaluation, treatment and hospitalization.  CT abdomen pelvis indicating constipation without other acute abnormalities.  CT also indicates possible right lower lobe pneumonia, evaluated with chest x-ray.  Chest x-ray nondiagnostic for acute infiltrate.  This can be followed expectantly.  CRITICAL CARE- yes Performed by: Daleen Bo  Nursing Notes Reviewed/ Care Coordinated Applicable Imaging Reviewed Interpretation of Laboratory Data incorporated into ED treatment  1:02 PM-Consult complete with hospitalist. Patient case explained and  discussed.  He agrees to admit patient for further evaluation and treatment. Call ended at 1:19 PM  Plan: Admit    Final Clinical Impression(s) / ED Diagnoses Final diagnoses:  Generalized abdominal pain  Syncope, unspecified syncope type  Tachycardia  Constipation, unspecified constipation type    Rx / DC Orders ED Discharge Orders    None       Daleen Bo, MD 03/04/20 1324

## 2020-03-04 NOTE — H&P (Signed)
History and Physical  [name] VVO:[id number] DOB: 07/11/1920 DOA: 03/04/2020   PCP: Loman Brooklyn, FNP   Patient coming from: Home  Chief Complaint: abdominal pain  HPI:  [name] is a 86 y.o. female with medical history of hypertension, CKD stage III, SVT, hyperlipidemia, GERD, right bundle branch block, hypertension, left breast cancer presenting with abdominal pain that began on 03/02/2020.  The patient states that she has had on and off generalized abdominal pain.  The patient's son is at the bedside to supplement history.  He states that the patient has had intermittent abdominal pain usually when she gets constipated.  He states that she is supposed to take MiraLAX on a daily basis, but she does not.  This has resulted in constipation which resulted in her abdominal pain.  She denies any dysuria, hematuria, hematochezia, melena.  She has had some nausea without any emesis.  She has had some decreased oral intake for the past 2 to 3 days as a result of her symptoms.  She states that she has not been taking her MiraLAX regularly. In addition, the patient had a syncopal episode on 03/02/2020.  She states that she got up on the morning of 03/02/2020 and was not feeling her usual self.  She was feeling "a little sick.  Just not feeling right" when she woke up that morning.  She experience some nausea and then some dizziness.  The next thing she remembered was going to the kitchen and waking up on the kitchen floor.  The patient's son states that she has had these nausea and dizzy spells in the past but has not resulted in syncope.  She denies any new medications.  She has not had any recent sick contacts.  There was no bowel or bladder incontinence nor did she bite her tongue.  She did not have any prodromal symptoms including headache, visual disturbance, chest pain, shortness of breath.    In the emergency department, the patient was afebrile hemodynamically stable with oxygen  saturation 99% on room air.  Initially she was discovered to have a heart rate in the 140s.  EKG showed SVT with right bundle branch block.  The patient was given Lopressor 2.5 mg IV x1 with improvement of her SVT.  She was given 1 L normal saline.  CT of the abdomen and pelvis was obtained and showed bibasilar pulmonary nodules that were unchanged.  There was right lower lobe centrilobular nodularity.  There is no hydronephrosis although her bladder was distended.  There was moderate colonic stool causing some distention of the right colon.  There was some fluid in the endometrial canal but this was decreased.  Because of her syncope and SVT, the patient was admitted for further evaluation and treatment.  Assessment/Plan: Syncope -Likely due to SVT and vasovagal phenomena -Orthostatic vital signs -TSH -Echocardiogram  SVT -The patient has had a history of paroxysmal SVT in the past -Increase metoprolol to 50 mg twice daily -Discontinue amlodipine to allow blood pressure margin for titration of metoprolol -TSH -Echocardiogram  Abdominal pain -Suspect this is due to constipation -CT abdomen/pelvis as discussed above -Start MiraLAX and bisacodyl suppository  CKD stage IIIa -Baseline creatinine 0.8-1.0 -A.m. BMP  Thrombocytopenia -This has been mild and intermittent in the past -Serum Y69 -Folic acid -TSH -No hepatosplenomegaly noted on CT abdomen and pelvis  Right bundle branch block -This appears to be chronic  Pulmonary nodules -Continue outpatient surveillance  Hypoglycemia -Check hemoglobin  A1c       Past Medical History:  Diagnosis Date  . Arthralgia   . Breast cancer ([place])    left breast   . Constipation   . Dyspepsia   . GERD (gastroesophageal reflux disease)   . High cholesterol   . Hypertension   . NSVT (nonsustained ventricular tachycardia) ([facility])    Short run of monomorphic VT in setting of hypokalemia during 09/2011 hospitalization. EF 60-65% by echo  09/26/11  . Right bundle branch block   . SVT (supraventricular tachycardia) ([place])    Noted 09/2011 responsive to cardizem  . Vertigo    Past Surgical History:  Procedure Laterality Date  . BREAST LUMPECTOMY     left breast cancer years ago  . CHOLECYSTECTOMY    . EYE SURGERY     1- eye - cataract removal   . TONSILLECTOMY     Social History:  reports that she has never smoked. She has never used smokeless tobacco. She reports that she does not drink alcohol and does not use drugs.   Family History  Problem Relation Age of Onset  . Hypertension Mother   . Tuberculosis Father   . Hypertension Father      Allergies  Allergen Reactions  . Cozaar [Losartan] Swelling    Oral swelling      Prior to Admission medications   Medication Sig Start Date End Date Taking? Authorizing Provider  amLODipine (NORVASC) 10 MG tablet TAKE 1 TABLET BY MOUTH EVERY DAY IN THE MORNING 01/10/20  Yes Loman Brooklyn, FNP  aspirin EC 81 MG tablet Take 81 mg by mouth daily.   Yes [provider]  esomeprazole (NEXIUM) 40 MG capsule Take 1 capsule (40 mg total) by mouth daily. 11/20/19  Yes Loman Brooklyn, FNP  fluticasone (FLONASE) 50 MCG/ACT nasal spray INSTILL 2 SPRAYS IN EACH NOSTRIL ONCE A DAY 11/19/19  Yes Hendricks Limes F, FNP  KLOR-CON M20 20 MEQ tablet Take 1 tablet (20 mEq total) by mouth daily. 11/20/19  Yes Loman Brooklyn, FNP  metoprolol tartrate (LOPRESSOR) 25 MG tablet Take 1 tablet (25 mg total) by mouth 2 (two) times daily. 11/20/19  Yes Hendricks Limes F, FNP  polyethylene glycol (MIRALAX / GLYCOLAX) 17 g packet Take 17 g by mouth daily as needed.   Yes [provider]    Review of Systems:  Constitutional:  No weight loss, night sweats, Fevers, chills, fatigue.  Head&Eyes: No headache.  No vision loss.  No eye pain or scotoma ENT:  No Difficulty swallowing,Tooth/dental problems,Sore throat,  No ear ache, post nasal drip,  Cardio-vascular:  No chest pain,  Orthopnea, PND, swelling in lower extremities,  dizziness, palpitations  GI:  No  vomiting, diarrhea, loss of appetite, hematochezia, melena, heartburn, indigestion, Resp:  No shortness of breath with exertion or at rest. No cough. No coughing up of blood .No wheezing.No chest wall deformity  Skin:  no rash or lesions.  GU:  no dysuria, change in color of urine, no urgency or frequency. No flank pain.  Musculoskeletal:  No joint pain or swelling. No decreased range of motion. No back pain.  Psych:  No change in mood or affect. No depression or anxiety. Neurologic: No headache, no dysesthesia, no focal weakness, no vision loss.   Physical Exam: Vitals:   03/04/20 1250 03/04/20 1305 03/04/20 1320 03/04/20 1335  BP:      Pulse:      Resp: (!) 29 (!) 33 (!) 31 (!) 29  Temp:      TempSrc:      SpO2:      Weight:      Height:       General:  A&O x 3, NAD, nontoxic, pleasant/cooperative Head/Eye: No conjunctival hemorrhage, no icterus, [place]/AT, No nystagmus ENT:  No icterus,  No thrush, good dentition, no pharyngeal exudate Neck:  No masses, no lymphadenpathy, no bruits CV:  RRR, no rub, no gallop, no S3 Lung:  CTAB, good air movement, no wheeze, no rhonchi Abdomen: soft/NT, +BS, nondistended, no peritoneal signs Ext: No cyanosis, No rashes, No petechiae, No lymphangitis, No edema Neuro: CNII-XII intact, strength 4/5 in bilateral upper and lower extremities, no dysmetria  Labs on Admission:  Basic Metabolic Panel: Recent Labs  Lab 03/04/20 1010  NA 135  K 4.0  CL 100  CO2 24  GLUCOSE 137*  BUN 18  CREATININE 0.91  CALCIUM 8.9   Liver Function Tests: Recent Labs  Lab 03/04/20 1010  AST 19  ALT 18  ALKPHOS 48  BILITOT 1.4*  PROT 7.7  ALBUMIN 3.9   No results for input(s): LIPASE, AMYLASE in the last 168 hours. No results for input(s): AMMONIA in the last 168 hours. CBC: Recent Labs  Lab 03/04/20 1010  WBC 7.2  NEUTROABS 6.0  HGB 12.5  HCT 39.7  MCV 86.9    PLT 142*   Coagulation Profile: No results for input(s): INR, PROTIME in the last 168 hours. Cardiac Enzymes: No results for input(s): CKTOTAL, CKMB, CKMBINDEX, TROPONINI in the last 168 hours. BNP: Invalid input(s): POCBNP CBG: No results for input(s): GLUCAP in the last 168 hours. Urine analysis:    Component Value Date/Time   COLORURINE YELLOW 04/11/2018 1332   APPEARANCEUR HAZY (A) 04/11/2018 1332   LABSPEC 1.014 04/11/2018 1332   PHURINE 6.0 04/11/2018 1332   GLUCOSEU NEGATIVE 04/11/2018 1332   HGBUR SMALL (A) 04/11/2018 1332   BILIRUBINUR NEGATIVE 04/11/2018 1332   KETONESUR NEGATIVE 04/11/2018 1332   PROTEINUR 30 (A) 04/11/2018 1332   UROBILINOGEN 0.2 04/20/2015 1445   NITRITE NEGATIVE 04/11/2018 1332   LEUKOCYTESUR LARGE (A) 04/11/2018 1332   Sepsis Labs: @LABRCNTIP (procalcitonin:4,lacticidven:4) ) Recent Results (from the past 240 hour(s))  SARS Coronavirus 2 by RT PCR (hospital order, performed in [place] hospital lab) Nasopharyngeal Nasopharyngeal Swab     Status: None   Collection Time: 03/04/20  9:58 AM   Specimen: Nasopharyngeal Swab  Result Value Ref Range Status   SARS Coronavirus 2 NEGATIVE NEGATIVE Final    Comment: (NOTE) SARS-CoV-2 target nucleic acids are NOT DETECTED.  The SARS-CoV-2 RNA is generally detectable in upper and lower respiratory specimens during the acute phase of infection. The lowest concentration of SARS-CoV-2 viral copies this assay can detect is 250 copies / mL. A negative result does not preclude SARS-CoV-2 infection and should not be used as the sole basis for treatment or other patient management decisions.  A negative result may occur with improper specimen collection / handling, submission of specimen other than nasopharyngeal swab, presence of viral mutation(s) within the areas targeted by this assay, and inadequate number of viral copies (<250 copies / mL). A negative result must be combined with  clinical observations, patient history, and epidemiological information.  Fact Sheet for Patients:   StrictlyIdeas.no  Fact Sheet for Healthcare Providers: BankingDealers.co.za  This test is not yet approved or  cleared by the [place] FDA and has been authorized for detection and/or diagnosis of SARS-CoV-2 by FDA under an Emergency Use  Authorization (EUA).  This EUA will remain in effect (meaning this test can be used) for the duration of the COVID-19 declaration under Section 564(b)(1) of the Act, 21 U.S.C. section 360bbb-3(b)(1), unless the authorization is terminated or revoked sooner.  Performed at [facility], [address], [place], [place] [address]      Radiological Exams on Admission: CT Abdomen Pelvis W Contrast  Result Date: 03/04/2020 CLINICAL DATA:  Abdominal pain and nausea EXAM: CT ABDOMEN AND PELVIS WITH CONTRAST TECHNIQUE: Multidetector CT imaging of the abdomen and pelvis was performed using the standard protocol following bolus administration of intravenous contrast. CONTRAST:  171mL OMNIPAQUE IOHEXOL 300 MG/ML  SOLN COMPARISON:  August 03, 2015 FINDINGS: Lower chest: Cardiomegaly with RIGHT atrial enlargement. Bibasilar tiny pulmonary nodules are unchanged comparison to prior. Mild RIGHT lower lobe centrilobular nodularity, likely infectious/inflammatory in etiology (series 5, image 5). Hepatobiliary: Hepatic steatosis. Status post cholecystectomy. Unchanged dilation of the common bile duct, most likely due to post cholecystectomy state. Pancreas: No adjacent inflammatory changes.  No suspicious masses. Spleen: Normal in size without focal abnormality. Adrenals/Urinary Tract: Unchanged wall thickening of the adrenal glands. The kidneys enhance symmetrically. Subcentimeter hypodense lesions are too small to accurately characterize. No hydronephrosis. Bladder is distended. Stomach/Bowel: Small hiatal hernia. Moderate  colonic stool burden predominantly within the distended RIGHT hemicolon. The appendix is unremarkable. No bowel obstruction. Vascular/Lymphatic: Aortic atherosclerosis. No enlarged abdominal or pelvic lymph nodes. Reproductive: Pessary. Revisualization of the dominant fibroid of the fundus measuring 6.6 cm. There is fluid within the endometrial canal, decreased in comparison to prior. Differential considerations include a necrotic fibroid. Hypodense fluid measures approximately 13 mm in thickness. Other: No free fluid. Musculoskeletal: Osteopenia. Mild wedging of L1, increased in comparison to prior. IMPRESSION: 1. Moderate colonic stool burden predominantly within the distended RIGHT hemicolon. 2. Mild wedging of L1, increased in comparison to prior study. Correlate with point tenderness. 3. Mild RIGHT lower lobe centrilobular nodularity, likely infectious/inflammatory in etiology. 4. There is fluid within the endometrial canal, decreased in comparison to prior. Differential considerations include a necrotic fibroid. Recommend correlation with pelvic ultrasound on a nonemergent basis. Aortic Atherosclerosis (ICD10-I70.0). Electronically Signed   By: Valentino Saxon MD   On: 03/04/2020 12:24   DG Chest Portable 1 View  Result Date: 03/04/2020 CLINICAL DATA:  Pain EXAM: PORTABLE CHEST 1 VIEW COMPARISON:  May 04, 2014 FINDINGS: The cardiomediastinal silhouette is unchanged and enlarged in contour.Atherosclerotic calcifications of the tortuous thoracic aorta. No pleural effusion. No pneumothorax. Biapical pleuroparenchymal scarring, unchanged. Scattered bibasilar reticulonodular opacities. Status post cholecystectomy. LEFT breast surgical clips. Multilevel degenerative changes of the thoracic spine. IMPRESSION: Minimal scattered bibasilar reticulonodular opacities. Differential considerations include aspiration versus infection versus atelectasis. Electronically Signed   By: Valentino Saxon MD   On:  03/04/2020 12:54    EKG: Independently reviewed. SVT, RBBB    Time spent:60 minutes Code Status:   FULL Family Communication:  Son updated at bedside 8/3 Disposition Plan: expect 1-2 day hospitalization Consults called: cardiology DVT Prophylaxis: [place] Lovenox  Orson Eva, DO  [facility] Pager [phone]  If 7PM-7AM, please contact night-coverage [email or web address] Password [email or web address] 03/04/2020, 2:06 PM

## 2020-03-04 NOTE — ED Triage Notes (Signed)
Pt c/o generalized abdominal pain and nausea started last night.

## 2020-03-05 ENCOUNTER — Observation Stay (HOSPITAL_BASED_OUTPATIENT_CLINIC_OR_DEPARTMENT_OTHER): Payer: Medicare Other

## 2020-03-05 DIAGNOSIS — E162 Hypoglycemia, unspecified: Secondary | ICD-10-CM | POA: Diagnosis present

## 2020-03-05 DIAGNOSIS — Z9049 Acquired absence of other specified parts of digestive tract: Secondary | ICD-10-CM | POA: Diagnosis not present

## 2020-03-05 DIAGNOSIS — Z7982 Long term (current) use of aspirin: Secondary | ICD-10-CM | POA: Diagnosis not present

## 2020-03-05 DIAGNOSIS — N183 Chronic kidney disease, stage 3 unspecified: Secondary | ICD-10-CM | POA: Diagnosis not present

## 2020-03-05 DIAGNOSIS — Z888 Allergy status to other drugs, medicaments and biological substances status: Secondary | ICD-10-CM | POA: Diagnosis not present

## 2020-03-05 DIAGNOSIS — I4510 Unspecified right bundle-branch block: Secondary | ICD-10-CM | POA: Diagnosis present

## 2020-03-05 DIAGNOSIS — I272 Pulmonary hypertension, unspecified: Secondary | ICD-10-CM | POA: Diagnosis present

## 2020-03-05 DIAGNOSIS — Z79899 Other long term (current) drug therapy: Secondary | ICD-10-CM | POA: Diagnosis not present

## 2020-03-05 DIAGNOSIS — Z66 Do not resuscitate: Secondary | ICD-10-CM | POA: Diagnosis not present

## 2020-03-05 DIAGNOSIS — K5900 Constipation, unspecified: Secondary | ICD-10-CM | POA: Diagnosis present

## 2020-03-05 DIAGNOSIS — N1831 Chronic kidney disease, stage 3a: Secondary | ICD-10-CM | POA: Diagnosis present

## 2020-03-05 DIAGNOSIS — I361 Nonrheumatic tricuspid (valve) insufficiency: Secondary | ICD-10-CM

## 2020-03-05 DIAGNOSIS — R7303 Prediabetes: Secondary | ICD-10-CM | POA: Diagnosis present

## 2020-03-05 DIAGNOSIS — E785 Hyperlipidemia, unspecified: Secondary | ICD-10-CM | POA: Diagnosis present

## 2020-03-05 DIAGNOSIS — K219 Gastro-esophageal reflux disease without esophagitis: Secondary | ICD-10-CM | POA: Diagnosis present

## 2020-03-05 DIAGNOSIS — Z831 Family history of other infectious and parasitic diseases: Secondary | ICD-10-CM | POA: Diagnosis not present

## 2020-03-05 DIAGNOSIS — Z853 Personal history of malignant neoplasm of breast: Secondary | ICD-10-CM | POA: Diagnosis not present

## 2020-03-05 DIAGNOSIS — Z7189 Other specified counseling: Secondary | ICD-10-CM | POA: Diagnosis not present

## 2020-03-05 DIAGNOSIS — I471 Supraventricular tachycardia: Secondary | ICD-10-CM | POA: Diagnosis not present

## 2020-03-05 DIAGNOSIS — Z515 Encounter for palliative care: Secondary | ICD-10-CM | POA: Diagnosis not present

## 2020-03-05 DIAGNOSIS — Z20822 Contact with and (suspected) exposure to covid-19: Secondary | ICD-10-CM | POA: Diagnosis present

## 2020-03-05 DIAGNOSIS — I129 Hypertensive chronic kidney disease with stage 1 through stage 4 chronic kidney disease, or unspecified chronic kidney disease: Secondary | ICD-10-CM | POA: Diagnosis present

## 2020-03-05 DIAGNOSIS — I083 Combined rheumatic disorders of mitral, aortic and tricuspid valves: Secondary | ICD-10-CM | POA: Diagnosis present

## 2020-03-05 DIAGNOSIS — Z8249 Family history of ischemic heart disease and other diseases of the circulatory system: Secondary | ICD-10-CM | POA: Diagnosis not present

## 2020-03-05 DIAGNOSIS — I10 Essential (primary) hypertension: Secondary | ICD-10-CM | POA: Diagnosis not present

## 2020-03-05 DIAGNOSIS — R55 Syncope and collapse: Secondary | ICD-10-CM | POA: Diagnosis not present

## 2020-03-05 DIAGNOSIS — E7800 Pure hypercholesterolemia, unspecified: Secondary | ICD-10-CM | POA: Diagnosis present

## 2020-03-05 DIAGNOSIS — D696 Thrombocytopenia, unspecified: Secondary | ICD-10-CM | POA: Diagnosis present

## 2020-03-05 DIAGNOSIS — R918 Other nonspecific abnormal finding of lung field: Secondary | ICD-10-CM | POA: Diagnosis present

## 2020-03-05 LAB — T4, FREE: Free T4: 1.17 ng/dL — ABNORMAL HIGH (ref 0.61–1.12)

## 2020-03-05 LAB — URINE CULTURE: Culture: NO GROWTH

## 2020-03-05 LAB — BASIC METABOLIC PANEL
Anion gap: 10 (ref 5–15)
BUN: 18 mg/dL (ref 8–23)
CO2: 25 mmol/L (ref 22–32)
Calcium: 8.9 mg/dL (ref 8.9–10.3)
Chloride: 102 mmol/L (ref 98–111)
Creatinine, Ser: 0.77 mg/dL (ref 0.44–1.00)
GFR calc Af Amer: >60 mL/min (ref >60)
GFR calc non Af Amer: >60 mL/min (ref >60)
Glucose, Bld: 130 mg/dL — ABNORMAL HIGH (ref 70–99)
Potassium: 3.6 mmol/L (ref 3.5–5.1)
Sodium: 137 mmol/L (ref 135–145)

## 2020-03-05 LAB — CBC
HCT: 39.6 % (ref 36.0–46.0)
Hemoglobin: 12.2 g/dL (ref 12.0–15.0)
MCH: 26.9 pg (ref 26.0–34.0)
MCHC: 30.8 g/dL (ref 30.0–36.0)
MCV: 87.4 fL (ref 80.0–100.0)
Platelets: 149 10*3/uL — ABNORMAL LOW (ref 150–400)
RBC: 4.53 MIL/uL (ref 3.87–5.11)
RDW: 15.5 % (ref 11.5–15.5)
WBC: 9.3 10*3/uL (ref 4.0–10.5)
nRBC: 0 % (ref 0.0–0.2)

## 2020-03-05 LAB — ECHOCARDIOGRAM COMPLETE
AR max vel: 2.4 cm2
AV Area VTI: 2.84 cm2
AV Area mean vel: 2.13 cm2
AV Mean grad: 4.3 mmHg
AV Peak grad: 8.7 mmHg
Ao pk vel: 1.48 m/s
Area-P 1/2: 3.48 cm2
Height: 66 in
P 1/2 time: 435 msec
S' Lateral: 2.36 cm
Weight: 2151.69 oz

## 2020-03-05 LAB — HEMOGLOBIN A1C
Hgb A1c MFr Bld: 5.9 % — ABNORMAL HIGH (ref 4.8–5.6)
Mean Plasma Glucose: 122.63 mg/dL

## 2020-03-05 LAB — MAGNESIUM: Magnesium: 2 mg/dL (ref 1.7–2.4)

## 2020-03-05 MED ORDER — METOPROLOL TARTRATE 5 MG/5ML IV SOLN
5.0000 mg | INTRAVENOUS | Status: DC | PRN
  Administered 2020-03-05: 5 mg via INTRAVENOUS
  Filled 2020-03-05: qty 5

## 2020-03-05 MED ORDER — METOPROLOL TARTRATE 50 MG PO TABS
50.0000 mg | ORAL_TABLET | Freq: Two times a day (BID) | ORAL | Status: DC
  Administered 2020-03-05 – 2020-03-06 (×3): 50 mg via ORAL
  Filled 2020-03-05 (×3): qty 1

## 2020-03-05 NOTE — ED Notes (Signed)
IV access accidentally pulled by pt

## 2020-03-05 NOTE — ED Notes (Addendum)
Per AM report pt heart rate increases to greater than 140 with activity and Orthostatic vitals have not been completed on previous shift. Pt heart rate ranged from 145-168 this am when pt got herself out of bed and ambulated to nurses station inquiring about the closest bathroom. Pt assisted to restroom, returned to room, placed back on cardiac monitor, call light within reach, purewick removed from room, gripper socks placed on patient. Pt heart rate back down to 88 a few minutes after being settled back in bed. nad noted. Denies pain, shortness of breath.

## 2020-03-05 NOTE — Evaluation (Signed)
Physical Therapy Evaluation Patient Details Name: [name] MRN: [id number] DOB: 1919/11/18 Today's Date: 03/05/2020   History of Present Illness  [name] is a 86 y.o. female with medical history of hypertension, CKD stage III, SVT, hyperlipidemia, GERD, right bundle branch block, hypertension, left breast cancer presenting with abdominal pain that began on 03/02/2020.  The patient states that she has had on and off generalized abdominal pain.  The patient's son is at the bedside to supplement history.  He states that the patient has had intermittent abdominal pain usually when she gets constipated.  He states that she is supposed to take MiraLAX on a daily basis, but she does not.  This has resulted in constipation which resulted in her abdominal pain.  She denies any dysuria, hematuria, hematochezia, melena.  She has had some nausea without any emesis.  She has had some decreased oral intake for the past 2 to 3 days as a result of her symptoms.  She states that she has not been taking her MiraLAX regularly.In addition, the patient had a syncopal episode on 03/02/2020.  She states that she got up on the morning of 03/02/2020 and was not feeling her usual self.  She was feeling "a little sick.  Just not feeling right" when she woke up that morning.  She experience some nausea and then some dizziness.  The next thing she remembered was going to the kitchen and waking up on the kitchen floor.  The patient's son states that she has had these nausea and dizzy spells in the past but has not resulted in syncope.  She denies any new medications.  She has not had any recent sick contacts.  There was no bowel or bladder incontinence nor did she bite her tongue.  She did not have any prodromal symptoms including headache, visual disturbance, chest pain, shortness of breath.    Clinical Impression  Patient functioning near baseline for functional mobility and gait, demonstrates good return for bed mobility,  transfers and ambulating in room/hallway without loss of balance, limited mostly due to c/o fatigue and tolerated sitting up at bedside to eat breakfast after therapy - RN notified.  Patient will benefit from continued physical therapy in hospital and recommended venue below to increase strength, balance, endurance for safe ADLs and gait.     Follow Up Recommendations Home health PT;Supervision - Intermittent;Supervision for mobility/OOB    Equipment Recommendations  None recommended by PT    Recommendations for Other Services       Precautions / Restrictions Precautions Precautions: None Restrictions Weight Bearing Restrictions: No      Mobility  Bed Mobility Overal bed mobility: Modified Independent             General bed mobility comments: slightly increased time  Transfers Overall transfer level: Needs assistance Equipment used: Quad cane Transfers: Sit to/from [facility] Sit to Stand: Supervision Stand pivot transfers: Supervision       General transfer comment: increased time, slightly labored movement  Ambulation/Gait Ambulation/Gait assistance: Supervision;Min guard Gait Distance (Feet): 50 Feet Assistive device: Quad cane Gait Pattern/deviations: Decreased step length - left;Decreased stance time - right;Decreased stride length;Step-to pattern Gait velocity: decreased   General Gait Details: slightly labored cadence without loss of balance with mostly 3 point gait pattern using quad-cane, limited secondary to c/o fatigue  Stairs            Wheelchair Mobility    Modified Rankin (Stroke Patients Only)  Balance Overall balance assessment: Needs assistance Sitting-balance support: Feet supported;No upper extremity supported Sitting balance-Leahy Scale: Good Sitting balance - Comments: seated at EOB   Standing balance support: During functional activity;Single extremity supported Standing balance-Leahy Scale:  Fair Standing balance comment: fair/good using quad-cane                             Pertinent Vitals/Pain Pain Assessment: No/denies pain    Home Living Family/patient expects to be discharged to:: Private residence Living Arrangements: Alone Available Help at Discharge: Family;Friend(s);Available PRN/intermittently Type of Home: House Home Access: Stairs to enter Entrance Stairs-Rails: Left Entrance Stairs-Number of Steps: 2-3 into carport Home Layout: One level Home Equipment: Shower seat;Cane - quad      Prior Function Level of Independence: Needs assistance   Gait / Transfers Assistance Needed: household ambulator using quad-cane  ADL's / Homemaking Assistance Needed: has home aide 2-3 hours/day x 2-3 days/week, family also assist        Hand Dominance        Extremity/Trunk Assessment   Upper Extremity Assessment Upper Extremity Assessment: Overall WFL for tasks assessed    Lower Extremity Assessment Lower Extremity Assessment: Generalized weakness    Cervical / Trunk Assessment Cervical / Trunk Assessment: Normal  Communication   Communication: No difficulties  Cognition Arousal/Alertness: Awake/alert Behavior During Therapy: WFL for tasks assessed/performed Overall Cognitive Status: Within Functional Limits for tasks assessed                                        General Comments      Exercises     Assessment/Plan    PT Assessment Patient needs continued PT services  PT Problem List Decreased strength;Decreased activity tolerance;Decreased balance;Decreased mobility       PT Treatment Interventions Balance training;Gait training;Stair training;Functional mobility training;Therapeutic activities;Therapeutic exercise;Patient/family education    PT Goals (Current goals can be found in the Care Plan section)  Acute Rehab PT Goals Patient Stated Goal: return home with family/friends to assist PT Goal Formulation:  With patient Time For Goal Achievement: 03/08/20 Potential to Achieve Goals: Good    Frequency Min 3X/week   Barriers to discharge        Co-evaluation               AM-PAC PT "6 Clicks" Mobility  Outcome Measure Help needed turning from your back to your side while in a flat bed without using bedrails?: None Help needed moving from lying on your back to sitting on the side of a flat bed without using bedrails?: None Help needed moving to and from a bed to a chair (including a wheelchair)?: A Little Help needed standing up from a chair using your arms (e.g., wheelchair or bedside chair)?: None Help needed to walk in hospital room?: A Little Help needed climbing 3-5 steps with a railing? : A Lot 6 Click Score: 20    End of Session   Activity Tolerance: Patient tolerated treatment well;Patient limited by fatigue Patient left: in bed;with call bell/phone within reach Nurse Communication: Mobility status PT Visit Diagnosis: Unsteadiness on feet (R26.81);Other abnormalities of gait and mobility (R26.89);Muscle weakness (generalized) (M62.81)    Time: 5093-2671 PT Time Calculation (min) (ACUTE ONLY): 20 min   Charges:   PT Evaluation $PT Eval Moderate Complexity: 1 Mod PT Treatments $Therapeutic Activity: 8-22 mins  11:13 AM, 03/05/20 Lonell Grandchild, MPT Physical Therapist with [facility] [phone] office [phone] mobile phone

## 2020-03-05 NOTE — Plan of Care (Signed)
  Problem: Acute Rehab PT Goals(only PT should resolve) Goal: Pt Will Go Supine/Side To Sit Outcome: Progressing Flowsheets (Taken 03/05/2020 1115) Pt will go Supine/Side to Sit:  Independently  with modified independence Goal: Patient Will Transfer Sit To/From Stand Outcome: Progressing Flowsheets (Taken 03/05/2020 1115) Patient will transfer sit to/from stand: with modified independence Goal: Pt Will Transfer Bed To Chair/Chair To Bed Outcome: Progressing Flowsheets (Taken 03/05/2020 1115) Pt will Transfer Bed to Chair/Chair to Bed:  with modified independence  with supervision Goal: Pt Will Ambulate Outcome: Progressing Flowsheets (Taken 03/05/2020 1115) Pt will Ambulate:  75 feet  with modified independence  with supervision  with cane Note: Quad-cane    11:16 AM, 03/05/20 Lonell Grandchild, MPT Physical Therapist with [facility] [phone] office [phone] mobile phone

## 2020-03-05 NOTE — Progress Notes (Signed)
*  PRELIMINARY RESULTS* Echocardiogram 2D Echocardiogram has been performed.  [name] 03/05/2020, 9:28 AM

## 2020-03-05 NOTE — Progress Notes (Signed)
PROGRESS NOTE   [name]  CVE:[id number] DOB: [birth year] DOA: 03/04/2020 PCP: Loman Brooklyn, FNP   Chief Complaint  Patient presents with  . Abdominal Pain    Brief Admission History:  86 y.o. female with medical history of hypertension, CKD stage III, SVT, hyperlipidemia, GERD, right bundle branch block, hypertension, left breast cancer presenting with abdominal pain that began on 03/02/2020.  She also is having dizzy spells and syncope and recurrent SVT.   Assessment & Plan:   Active Problems:   PSVT (paroxysmal supraventricular tachycardia) (HCC)   Right bundle branch block   Essential hypertension, benign   GERD (gastroesophageal reflux disease)   Constipation   Stage 3a chronic kidney disease   SVT (supraventricular tachycardia) (HCC)   Syncope and collapse  1. Syncope - pt has had several episodes likely from recurrent SVT spells.  PT eval pending.  Metoprolol dose increased to hopefully control SVT.  Follow Echo and tsh.  2. Recurrent SVT - seems better controlled on higher dose of metoprolol 50 mg BID. Follow.  Cardiology consultation appreciated.  3. Generalized abdominal pain - secondary to constipation - continue laxatives.  4. Stage 3a CKD - stable.  Follow.  5. Thrombocytopenia - stable.  6. Prediabetes - diet control, a1c 5.9%.  7. RBBB - stable, chronic.  8. Pulmonary nodules - outpatient follow up for ongoing surveillance.   DVT prophylaxis: enoxaparin  Code Status: full  Family Communication: son, daughter updated  Disposition: home with [facility] when cleared by cardiology team   Status is: Observation  The patient remains OBS appropriate and will d/c before 2 midnights.  Dispo: The patient is from: Home              Anticipated d/c is to: Home              Anticipated d/c date is: 1 day              Patient currently is not medically stable to d/c.  Pt being worked up for syncope with medication adjustments and 2D echo and PT eval which are pending.     Consultants:   cardiology  Procedures:   Pending   Antimicrobials:  n/a   Subjective: Pt says she has not had a bowel movement yet.   Objective: Vitals:   03/05/20 1014 03/05/20 1020 03/05/20 1030 03/05/20 1113  BP:  (!) 161/79 (!) 162/81 (!) 176/67  Pulse: 79   85  Resp: (!) 22 17 (!) 28 (!) 24  Temp:    98.1 F (36.7 C)  TempSrc:      SpO2: 100%   96%  Weight:      Height:       No intake or output data in the 24 hours ending 03/05/20 1419 Filed Weights   03/04/20 0946 03/05/20 0747  Weight: 61.2 kg 61 kg    Examination:  General exam: Appears calm and comfortable  Respiratory system: Clear to auscultation. Respiratory effort normal. Cardiovascular system: S1 & S2 heard, RRR. No JVD, murmurs, rubs, gallops or clicks. No pedal edema. Gastrointestinal system: Abdomen is nondistended, soft and nontender. No organomegaly or masses felt. Normal bowel sounds heard. Central nervous system: Alert and oriented. No focal neurological deficits. Extremities: Symmetric 5 x 5 power. Skin: No rashes, lesions or ulcers Psychiatry: Judgement and insight appear normal. Mood & affect appropriate.   Data Reviewed: I have personally reviewed following labs and imaging studies  CBC: Recent Labs  Lab 03/04/20 1010 03/05/20  0409  WBC 7.2 9.3  NEUTROABS 6.0  --   HGB 12.5 12.2  HCT 39.7 39.6  MCV 86.9 87.4  PLT 142* 149*    Basic Metabolic Panel: Recent Labs  Lab 03/04/20 1010 03/05/20 0409  NA 135 137  K 4.0 3.6  CL 100 102  CO2 24 25  GLUCOSE 137* 130*  BUN 18 18  CREATININE 0.91 0.77  CALCIUM 8.9 8.9  MG  --  2.0    GFR: Estimated Creatinine Clearance: 37.6 mL/min (by C-G formula based on SCr of 0.77 mg/dL).  Liver Function Tests: Recent Labs  Lab 03/04/20 1010  AST 19  ALT 18  ALKPHOS 48  BILITOT 1.4*  PROT 7.7  ALBUMIN 3.9    CBG: No results for input(s): GLUCAP in the last 168 hours.  Recent Results (from the past 240 hour(s))  SARS  Coronavirus 2 by RT PCR (hospital order, performed in [facility] hospital lab) Nasopharyngeal Nasopharyngeal Swab     Status: None   Collection Time: 03/04/20  9:58 AM   Specimen: Nasopharyngeal Swab  Result Value Ref Range Status   SARS Coronavirus 2 NEGATIVE NEGATIVE Final    Comment: (NOTE) SARS-CoV-2 target nucleic acids are NOT DETECTED.  The SARS-CoV-2 RNA is generally detectable in upper and lower respiratory specimens during the acute phase of infection. The lowest concentration of SARS-CoV-2 viral copies this assay can detect is 250 copies / mL. A negative result does not preclude SARS-CoV-2 infection and should not be used as the sole basis for treatment or other patient management decisions.  A negative result may occur with improper specimen collection / handling, submission of specimen other than nasopharyngeal swab, presence of viral mutation(s) within the areas targeted by this assay, and inadequate number of viral copies (<250 copies / mL). A negative result must be combined with clinical observations, patient history, and epidemiological information.  Fact Sheet for Patients:   StrictlyIdeas.no  Fact Sheet for Healthcare Providers: BankingDealers.co.za  This test is not yet approved or  cleared by the [place] FDA and has been authorized for detection and/or diagnosis of SARS-CoV-2 by FDA under an Emergency Use Authorization (EUA).  This EUA will remain in effect (meaning this test can be used) for the duration of the COVID-19 declaration under Section 564(b)(1) of the Act, 21 U.S.C. section 360bbb-3(b)(1), unless the authorization is terminated or revoked sooner.  Performed at [facility], [address] [place], [place] [address]      Radiology Studies: CT Abdomen Pelvis W Contrast  Result Date: 03/04/2020 CLINICAL DATA:  Abdominal pain and nausea EXAM: CT ABDOMEN AND PELVIS WITH CONTRAST TECHNIQUE:  Multidetector CT imaging of the abdomen and pelvis was performed using the standard protocol following bolus administration of intravenous contrast. CONTRAST:  158mL OMNIPAQUE IOHEXOL 300 MG/ML  SOLN COMPARISON:  August 03, 2015 FINDINGS: Lower chest: Cardiomegaly with RIGHT atrial enlargement. Bibasilar tiny pulmonary nodules are unchanged comparison to prior. Mild RIGHT lower lobe centrilobular nodularity, likely infectious/inflammatory in etiology (series 5, image 5). Hepatobiliary: Hepatic steatosis. Status post cholecystectomy. Unchanged dilation of the common bile duct, most likely due to post cholecystectomy state. Pancreas: No adjacent inflammatory changes.  No suspicious masses. Spleen: Normal in size without focal abnormality. Adrenals/Urinary Tract: Unchanged wall thickening of the adrenal glands. The kidneys enhance symmetrically. Subcentimeter hypodense lesions are too small to accurately characterize. No hydronephrosis. Bladder is distended. Stomach/Bowel: Small hiatal hernia. Moderate colonic stool burden predominantly within the distended RIGHT hemicolon. The appendix is unremarkable.  No bowel obstruction. Vascular/Lymphatic: Aortic atherosclerosis. No enlarged abdominal or pelvic lymph nodes. Reproductive: Pessary. Revisualization of the dominant fibroid of the fundus measuring 6.6 cm. There is fluid within the endometrial canal, decreased in comparison to prior. Differential considerations include a necrotic fibroid. Hypodense fluid measures approximately 13 mm in thickness. Other: No free fluid. Musculoskeletal: Osteopenia. Mild wedging of L1, increased in comparison to prior. IMPRESSION: 1. Moderate colonic stool burden predominantly within the distended RIGHT hemicolon. 2. Mild wedging of L1, increased in comparison to prior study. Correlate with point tenderness. 3. Mild RIGHT lower lobe centrilobular nodularity, likely infectious/inflammatory in etiology. 4. There is fluid within the  endometrial canal, decreased in comparison to prior. Differential considerations include a necrotic fibroid. Recommend correlation with pelvic ultrasound on a nonemergent basis. Aortic Atherosclerosis (ICD10-I70.0). Electronically Signed   By: Valentino Saxon MD   On: 03/04/2020 12:24   DG Chest Portable 1 View  Result Date: 03/04/2020 CLINICAL DATA:  Pain EXAM: PORTABLE CHEST 1 VIEW COMPARISON:  May 04, 2014 FINDINGS: The cardiomediastinal silhouette is unchanged and enlarged in contour.Atherosclerotic calcifications of the tortuous thoracic aorta. No pleural effusion. No pneumothorax. Biapical pleuroparenchymal scarring, unchanged. Scattered bibasilar reticulonodular opacities. Status post cholecystectomy. LEFT breast surgical clips. Multilevel degenerative changes of the thoracic spine. IMPRESSION: Minimal scattered bibasilar reticulonodular opacities. Differential considerations include aspiration versus infection versus atelectasis. Electronically Signed   By: Valentino Saxon MD   On: 03/04/2020 12:54   ECHOCARDIOGRAM COMPLETE  Result Date: 03/05/2020    ECHOCARDIOGRAM REPORT   Patient Name:   [name] Date of Exam: 03/05/2020 Medical Rec #:  [id number]         Height:       66.0 in Accession #:    [id number]        Weight:       134.5 lb Date of Birth:  1919-12-21         BSA:          1.689 m Patient Age:    44 years          BP:           157/88 mmHg Patient Gender: F                 HR:           90 bpm. Exam Location:  [facility] Procedure: 2D Echo Indications:    SVT (supraventricular tachycardia  History:        Patient has no prior history of Echocardiogram examinations.                 Risk Factors:Hypertension, Non-Smoker and Dyslipidemia. PSVT,                 RBBB, GERD.  Sonographer:    Leavy Cella RDCS (AE) Referring Phys: [phone] DAVID TAT IMPRESSIONS  1. Left ventricular ejection fraction, by estimation, is 60 to 65%. The left ventricle has normal function. The left  ventricle has no regional wall motion abnormalities. There is mild left ventricular hypertrophy. Left ventricular diastolic parameters are consistent with Grade I diastolic dysfunction (impaired relaxation). Elevated left atrial pressure.  2. Right ventricular systolic function is normal. The right ventricular size is normal. There is severely elevated pulmonary artery systolic pressure.  3. The mitral valve is normal in structure. Trivial mitral valve regurgitation. No evidence of mitral stenosis.  4. The aortic valve is tricuspid. Aortic valve regurgitation is not visualized. No aortic stenosis is present.  5. The inferior vena cava is normal in size with greater than 50% respiratory variability, suggesting right atrial pressure of 3 mmHg. FINDINGS  Left Ventricle: Left ventricular ejection fraction, by estimation, is 60 to 65%. The left ventricle has normal function. The left ventricle has no regional wall motion abnormalities. The left ventricular internal cavity size was normal in size. There is  mild left ventricular hypertrophy. Left ventricular diastolic parameters are consistent with Grade I diastolic dysfunction (impaired relaxation). Elevated left atrial pressure. Right Ventricle: The right ventricular size is normal. No increase in right ventricular wall thickness. Right ventricular systolic function is normal. There is severely elevated pulmonary artery systolic pressure. The tricuspid regurgitant velocity is 3.60 m/s, and with an assumed right atrial pressure of 10 mmHg, the estimated right ventricular systolic pressure is 36.6 mmHg. Left Atrium: Left atrial size was normal in size. Right Atrium: Right atrial size was normal in size. Pericardium: There is no evidence of pericardial effusion. Mitral Valve: The mitral valve is normal in structure. There is mild thickening of the mitral valve leaflet(s). There is mild calcification of the mitral valve leaflet(s). Mild mitral annular calcification. Trivial  mitral valve regurgitation. No evidence  of mitral valve stenosis. Tricuspid Valve: The tricuspid valve is normal in structure. Tricuspid valve regurgitation is mild . No evidence of tricuspid stenosis. Aortic Valve: The aortic valve is tricuspid. . There is mild thickening and mild calcification of the aortic valve. Aortic valve regurgitation is not visualized. Aortic regurgitation PHT measures 435 msec. No aortic stenosis is present. Mild aortic valve  annular calcification. There is mild thickening of the aortic valve. There is mild calcification of the aortic valve. Aortic valve mean gradient measures 4.3 mmHg. Aortic valve peak gradient measures 8.7 mmHg. Aortic valve area, by VTI measures 2.84 cm. Pulmonic Valve: The pulmonic valve was not well visualized. Pulmonic valve regurgitation is not visualized. No evidence of pulmonic stenosis. Aorta: The aortic root is normal in size and structure. Pulmonary Artery: Moderate pulmonary HTN, PASP is 55 mmHg. Venous: The inferior vena cava is normal in size with greater than 50% respiratory variability, suggesting right atrial pressure of 3 mmHg. IAS/Shunts: The interatrial septum was not well visualized.  LEFT VENTRICLE PLAX 2D LVIDd:         3.63 cm  Diastology LVIDs:         2.36 cm  LV e' lateral:   5.33 cm/s LV PW:         1.10 cm  LV E/e' lateral: 14.6 LV IVS:        1.07 cm  LV e' medial:    5.66 cm/s LVOT diam:     2.00 cm  LV E/e' medial:  13.7 LV SV:         76 LV SV Index:   45 LVOT Area:     3.14 cm  RIGHT VENTRICLE RV S prime:     13.10 cm/s TAPSE (M-mode): 2.2 cm LEFT ATRIUM             Index       RIGHT ATRIUM           Index LA diam:        3.00 cm 1.78 cm/m  RA Area:     12.10 cm LA Vol (A2C):   46.5 ml 27.52 ml/m RA Volume:   27.10 ml  16.04 ml/m LA Vol (A4C):   29.7 ml 17.58 ml/m LA Biplane Vol: 39.4 ml 23.32 ml/m  AORTIC VALVE  AV Area (Vmax):    2.40 cm AV Area (Vmean):   2.13 cm AV Area (VTI):     2.84 cm AV Vmax:           147.68 cm/s AV  Vmean:          96.923 cm/s AV VTI:            0.266 m AV Peak Grad:      8.7 mmHg AV Mean Grad:      4.3 mmHg LVOT Vmax:         112.68 cm/s LVOT Vmean:        65.829 cm/s LVOT VTI:          0.241 m LVOT/AV VTI ratio: 0.91 AI PHT:            435 msec  AORTA Ao Root diam: 2.80 cm MITRAL VALVE                TRICUSPID VALVE MV Area (PHT): 3.48 cm     TR Peak grad:   51.8 mmHg MV Decel Time: 218 msec     TR Vmax:        360.00 cm/s MV E velocity: 77.70 cm/s MV A velocity: 109.00 cm/s  SHUNTS MV E/A ratio:  0.71         Systemic VTI:  0.24 m                             Systemic Diam: 2.00 cm Carlyle Dolly MD Electronically signed by Carlyle Dolly MD Signature Date/Time: 03/05/2020/12:41:08 PM    Final    Scheduled Meds: . aspirin EC  81 mg Oral Daily  . bisacodyl  10 mg Rectal Once  . enoxaparin (LOVENOX) injection  40 mg Subcutaneous Q24H  . fluticasone  2 spray Each Nare Daily  . metoprolol tartrate  50 mg Oral BID  . pantoprazole  80 mg Oral Q1200  . polyethylene glycol  17 g Oral Daily   Continuous Infusions:   LOS: 0 days   Time spent: 27 mins  Zia Najera Wynetta Emery, MD How to contact the [facility] Attending or Consulting provider [place] or covering provider during after hours [place], for this patient?  1. Check the care team in [facility] and look for a) attending/consulting [facility] provider listed and b) the [facility] team listed 2. Log into [email or web address] and use [place]'s universal password to access. If you do not have the password, please contact the hospital operator. 3. Locate the [facility] provider you are looking for under [facility] and page to a number that you can be directly reached. 4. If you still have difficulty reaching the provider, please page the [facility] (Director on Call) for the Hospitalists listed on amion for assistance.  03/05/2020, 2:19 PM

## 2020-03-05 NOTE — Consult Note (Addendum)
Cardiology Consult    Patient ID: LAQUILLA DAULT; [id number]; 08/12/1919   Admit date: 03/04/2020 Date of Consult: 03/05/2020  Primary Care Provider: Loman Brooklyn, FNP Primary Cardiologist: New to [facility] - Dr. Harl Bowie (previously followed by Dr. Verl Blalock in 2014)  Patient Profile    [name] is a 86 y.o. female with past medical history of HTN, HLD, Stage 3 CKD, pSVT and breast cancer (s/p lumpectomy) who is being seen today for the evaluation of SVT at the request of Dr. Carles Collet.   History of Present Illness    Ms. Bloodworth presented to [facility] ED on 03/04/2020 for evaluation of abdominal pain and nausea which started the night prior. She reports having intermittent nausea and vomiting but felt her symptoms quickly progressed the day prior to admission. She reports good oral intake and says she usually has a good appetite. She lives by herself but her children check on her regularly. She does use a cane for ambulation. She denies any recent chest pain or palpitations. Was asymptomatic with her arrhythmia yesterday. No recent orthopnea, PND or lower extremity edema. She does report having a fall a few weeks back. Says she woke up on the ground. Believes she lost consciousness but unsure for how long. Unaware of any precipitating events. Denies any recurrence since.   Initial labs show WBC 7.2, Hb 12.5, platelets 142, Na+ 135, K+ 4.0 and creatinine 0.91. Lactic acid 1.8.  TSH 1.567. Initial high-sensitivity troponin 30 with repeat of 34. COVID negative. CXR showed minimal scattered bibasilar reticulonodular opacities with possible aspiration versus infection versus atelectasis. Abdominal CT showed a moderate colonic stool burden and a mild right lower lobe centrilobular nodularity most consistent with infectious/inflammatory etiology. She did have fluid within the endometrial canal and pelvic ultrasound was recommended on a nonemergent basis. EKG shows a narrow-complex tachycardia, HR 148 with  underlying RBBB.   She was inially started on IV Cardizem for rate-control which has since been discontinued. PTA Lopressor has been titrated from 25mg  BID to 50mg  BID by the admitting team. HR was in the 60's to 80's overnight but around 0745 today, she had recurrent tachycardia with HR in the 130's. Unclear if SVT or perhaps a 2:1 atrial flutter as it is difficult to distinguish p-waves. She has since spontaneously converted back to NSR.   Past Medical History:  Diagnosis Date  . Arthralgia   . Breast cancer ([place])    left breast   . Constipation   . Dyspepsia   . GERD (gastroesophageal reflux disease)   . High cholesterol   . Hypertension   . NSVT (nonsustained ventricular tachycardia) ([facility])    Short run of monomorphic VT in setting of hypokalemia during 09/2011 hospitalization. EF 60-65% by echo 09/26/11  . Right bundle Jolena Kittle block   . SVT (supraventricular tachycardia) ([place])    Noted 09/2011 responsive to cardizem  . Vertigo     Past Surgical History:  Procedure Laterality Date  . BREAST LUMPECTOMY     left breast cancer years ago  . CHOLECYSTECTOMY    . EYE SURGERY     1- eye - cataract removal   . TONSILLECTOMY       Home Medications:  Prior to Admission medications   Medication Sig Start Date End Date Taking? Authorizing Provider  amLODipine (NORVASC) 10 MG tablet TAKE 1 TABLET BY MOUTH EVERY DAY IN THE MORNING 01/10/20  Yes Hendricks Limes F, FNP  aspirin EC 81 MG tablet Take 81  mg by mouth daily.   Yes [provider]  esomeprazole (NEXIUM) 40 MG capsule Take 1 capsule (40 mg total) by mouth daily. 11/20/19  Yes Loman Brooklyn, FNP  fluticasone (FLONASE) 50 MCG/ACT nasal spray INSTILL 2 SPRAYS IN EACH NOSTRIL ONCE A DAY 11/19/19  Yes Hendricks Limes F, FNP  KLOR-CON M20 20 MEQ tablet Take 1 tablet (20 mEq total) by mouth daily. 11/20/19  Yes Loman Brooklyn, FNP  metoprolol tartrate (LOPRESSOR) 25 MG tablet Take 1 tablet (25 mg total) by mouth 2 (two) times daily.  11/20/19  Yes Hendricks Limes F, FNP  polyethylene glycol (MIRALAX / GLYCOLAX) 17 g packet Take 17 g by mouth daily as needed.   Yes [provider]    Inpatient Medications: Scheduled Meds: . aspirin EC  81 mg Oral Daily  . bisacodyl  10 mg Rectal Once  . enoxaparin (LOVENOX) injection  40 mg Subcutaneous Q24H  . fluticasone  2 spray Each Nare Daily  . metoprolol tartrate  50 mg Oral BID  . pantoprazole  80 mg Oral Q1200  . polyethylene glycol  17 g Oral Daily   Continuous Infusions: . diltiazem (CARDIZEM) infusion Stopped (03/05/20 0004)   PRN Meds: acetaminophen **OR** acetaminophen, ondansetron **OR** ondansetron (ZOFRAN) IV  Allergies:    Allergies  Allergen Reactions  . Cozaar [Losartan] Swelling    Oral swelling     Social History:   Social History   Socioeconomic History  . Marital status: Widowed    Spouse name: Not on file  . Number of children: 4  . Years of education: Not on file  . Highest education level: Not on file  Occupational History  . Not on file  Tobacco Use  . Smoking status: Never Smoker  . Smokeless tobacco: Never Used  Vaping Use  . Vaping Use: Never used  Substance and Sexual Activity  . Alcohol use: No  . Drug use: No  . Sexual activity: Not Currently    Birth control/protection: Post-menopausal  Other Topics Concern  . Not on file  Social History Narrative   5 children - 1 passed away,    Lives alone - has alert   Social Determinants of Health   Financial Resource Strain:   . Difficulty of Paying Living Expenses:   Food Insecurity:   . Worried About Charity fundraiser in the Last Year:   . Arboriculturist in the Last Year:   Transportation Needs:   . Film/video editor (Medical):   Marland Kitchen Lack of Transportation (Non-Medical):   Physical Activity:   . Days of Exercise per Week:   . Minutes of Exercise per Session:   Stress:   . Feeling of Stress :   Social Connections:   . Frequency of Communication with Friends  and Family:   . Frequency of Social Gatherings with Friends and Family:   . Attends Religious Services:   . Active Member of Clubs or Organizations:   . Attends Archivist Meetings:   Marland Kitchen Marital Status:   Intimate Partner Violence:   . Fear of Current or Ex-Partner:   . Emotionally Abused:   Marland Kitchen Physically Abused:   . Sexually Abused:      Family History:    Family History  Problem Relation Age of Onset  . Hypertension Mother   . Tuberculosis Father   . Hypertension Father       Review of Systems    General:  No chills, fever,  night sweats or weight changes.  Cardiovascular:  No chest pain, dyspnea on exertion, edema, orthopnea, palpitations, paroxysmal nocturnal dyspnea. Dermatological: No rash, lesions/masses Respiratory: No cough, dyspnea Urologic: No hematuria, dysuria Abdominal:   No diarrhea, bright red blood per rectum, melena, or hematemesis. Positive for nausea and abdominal pain.  Neurologic:  No visual changes, wkns, changes in mental status. All other systems reviewed and are otherwise negative except as noted above.  Physical Exam/Data    Vitals:   03/05/20 0716 03/05/20 0740 03/05/20 0745 03/05/20 0747  BP: (!) 147/58 (!) 213/86 (!) 143/99   Pulse:  98 (!) 146   Resp: 20 (!) 22 (!) 30   Temp:      TempSrc:      SpO2:  (!) 87% 97%   Weight:    61 kg  Height:    5\' 6"  (1.676 m)   No intake or output data in the 24 hours ending 03/05/20 0842 Filed Weights   03/04/20 0946 03/05/20 0747  Weight: 61.2 kg 61 kg   Body mass index is 21.71 kg/m.   General: Pleasant, elderly female appearing in NAD. Appears younger than stated age.  Psych: Normal affect. Neuro: Alert and oriented X 3. Moves all extremities spontaneously. HEENT: Normal  Neck: Supple without bruits or JVD. Lungs:  Resp regular and unlabored, CTA without wheezing or rales. Heart: RRR no s3, s4, 2/6 SEM along RUSB. Abdomen: Soft, non-tender, non-distended, BS + x 4.  Extremities:  No clubbing, cyanosis or edema. DP/PT/Radials 2+ and equal bilaterally.   EKG:  The EKG was personally reviewed and demonstrates: Narrow-complex tachycardia, HR 148 with underlying RBBB.   Telemetry:  Telemetry was personally reviewed and demonstrates: SR with HR in the 60's to 80's overnight but around 0745 today, she had recurrent tachycardia with HR in the 130's. Unclear if SVT or perhaps a 2:1 atrial flutter as it is difficult to distinguish p-waves at times but p-waves noted as HR slowed. Now back in NSR.   Labs/Studies     Relevant CV Studies:  Echocardiogram: 08/2015 Study Conclusions   - Left ventricle: The cavity size was normal. Wall thickness was  increased in a pattern of mild LVH. Systolic function was normal.  The estimated ejection fraction was in the range of 55% to 60%.  Wall motion was normal; there were no regional wall motion  abnormalities. Doppler parameters are consistent with abnormal  left ventricular relaxation (grade 1 diastolic dysfunction).  Doppler parameters are consistent with high ventricular filling  pressure.  - Aortic valve: Mildly to moderately calcified annulus. Trileaflet;  mildly calcified leaflets. There was mild regurgitation.  - Mitral valve: Moderately calcified annulus. There was trivial  regurgitation.  - Left atrium: The atrium was mildly dilated.  - Right atrium: Central venous pressure (est): 3 mm Hg.  - Tricuspid valve: There was mild regurgitation.  - Pulmonary arteries: PA peak pressure: 39 mm Hg (S).  - Pericardium, extracardiac: There was no pericardial effusion.  Laboratory Data:  Chemistry Recent Labs  Lab 03/04/20 1010 03/05/20 0409  NA 135 137  K 4.0 3.6  CL 100 102  CO2 24 25  GLUCOSE 137* 130*  BUN 18 18  CREATININE 0.91 0.77  CALCIUM 8.9 8.9  GFRNONAA 53* >60  GFRAA >60 >60  ANIONGAP 11 10    Recent Labs  Lab 03/04/20 1010  PROT 7.7  ALBUMIN 3.9  AST 19  ALT 18  ALKPHOS 48    BILITOT 1.4*   Hematology  Recent Labs  Lab 03/04/20 1010 03/05/20 0409  WBC 7.2 9.3  RBC 4.57 4.53  HGB 12.5 12.2  HCT 39.7 39.6  MCV 86.9 87.4  MCH 27.4 26.9  MCHC 31.5 30.8  RDW 15.6* 15.5  PLT 142* 149*   Cardiac EnzymesNo results for input(s): TROPONINI in the last 168 hours. No results for input(s): TROPIPOC in the last 168 hours.  BNPNo results for input(s): BNP, PROBNP in the last 168 hours.  DDimer No results for input(s): DDIMER in the last 168 hours.  Radiology/Studies:  CT Abdomen Pelvis W Contrast  Result Date: 03/04/2020 CLINICAL DATA:  Abdominal pain and nausea EXAM: CT ABDOMEN AND PELVIS WITH CONTRAST TECHNIQUE: Multidetector CT imaging of the abdomen and pelvis was performed using the standard protocol following bolus administration of intravenous contrast. CONTRAST:  126mL OMNIPAQUE IOHEXOL 300 MG/ML  SOLN COMPARISON:  August 03, 2015 FINDINGS: Lower chest: Cardiomegaly with RIGHT atrial enlargement. Bibasilar tiny pulmonary nodules are unchanged comparison to prior. Mild RIGHT lower lobe centrilobular nodularity, likely infectious/inflammatory in etiology (series 5, image 5). Hepatobiliary: Hepatic steatosis. Status post cholecystectomy. Unchanged dilation of the common bile duct, most likely due to post cholecystectomy state. Pancreas: No adjacent inflammatory changes.  No suspicious masses. Spleen: Normal in size without focal abnormality. Adrenals/Urinary Tract: Unchanged wall thickening of the adrenal glands. The kidneys enhance symmetrically. Subcentimeter hypodense lesions are too small to accurately characterize. No hydronephrosis. Bladder is distended. Stomach/Bowel: Small hiatal hernia. Moderate colonic stool burden predominantly within the distended RIGHT hemicolon. The appendix is unremarkable. No bowel obstruction. Vascular/Lymphatic: Aortic atherosclerosis. No enlarged abdominal or pelvic lymph nodes. Reproductive: Pessary. Revisualization of the dominant  fibroid of the fundus measuring 6.6 cm. There is fluid within the endometrial canal, decreased in comparison to prior. Differential considerations include a necrotic fibroid. Hypodense fluid measures approximately 13 mm in thickness. Other: No free fluid. Musculoskeletal: Osteopenia. Mild wedging of L1, increased in comparison to prior. IMPRESSION: 1. Moderate colonic stool burden predominantly within the distended RIGHT hemicolon. 2. Mild wedging of L1, increased in comparison to prior study. Correlate with point tenderness. 3. Mild RIGHT lower lobe centrilobular nodularity, likely infectious/inflammatory in etiology. 4. There is fluid within the endometrial canal, decreased in comparison to prior. Differential considerations include a necrotic fibroid. Recommend correlation with pelvic ultrasound on a nonemergent basis. Aortic Atherosclerosis (ICD10-I70.0). Electronically Signed   By: Valentino Saxon MD   On: 03/04/2020 12:24   DG Chest Portable 1 View  Result Date: 03/04/2020 CLINICAL DATA:  Pain EXAM: PORTABLE CHEST 1 VIEW COMPARISON:  May 04, 2014 FINDINGS: The cardiomediastinal silhouette is unchanged and enlarged in contour.Atherosclerotic calcifications of the tortuous thoracic aorta. No pleural effusion. No pneumothorax. Biapical pleuroparenchymal scarring, unchanged. Scattered bibasilar reticulonodular opacities. Status post cholecystectomy. LEFT breast surgical clips. Multilevel degenerative changes of the thoracic spine. IMPRESSION: Minimal scattered bibasilar reticulonodular opacities. Differential considerations include aspiration versus infection versus atelectasis. Electronically Signed   By: Valentino Saxon MD   On: 03/04/2020 12:54     Assessment & Plan    1. SVT/Possible Atrial Flutter - She has a history of SVT and her EKG on admission appears most consistent with SVT but she has experienced recurrent tachycardia since admission and it is difficult to distinguish possible SVT  from atrial flutter based off telemetry from this morning. Will review further with Dr. Harl Bowie. Would not want to commit to anticoagulation unless definitive atrial flutter given her advanced age (CHA2DS2-VASc Score and unadjusted Ischemic Stroke Rate (% per year) is  equal to 7.2 % stroke rate/year from a score of 5 (HTN, Aortic Plaque, Female, Age (2)).  - Electrolytes and TSH within a normal range. Will add Mg to AM labs. Agree with titration of Lopressor from 25mg  BID to 50mg  BID. Hold Amlodipine to allow for further titration if needed. Would not aggressively titrate given her underlying conduction disease.   2. Syncope - Unclear etiology. Says she was in the kitchen and the next thing she remembers is that she awoke on the floor. Denies any precipitating events. She has experienced post-termination pauses but the one from this morning was less than 1.5 seconds. Echocardiogram is pending to assess for any structural abnormalities. She does have a SEM on examination concerning for AS but it appears she had calcification along the leaflets in 2017. Would consider a 30-day monitor at the time of discharge for while she is 86 yo, she is overall in good health for her age and still lives alone and performs ADL's independently.   3. HTN - BP has been recorded as variable from 110/77  - 202/94 within the past 24 hours, at 143/99 on most recent check. Lopressor has been titrated as outlined above. Amlodipine currently held.   4. Stage 2-3 CKD - Creatinine stable at 0.77 this AM.     For questions or updates, please contact [facility] Please consult [email or web address] for contact info under Cardiology/STEMI.  Signed, Erma Heritage, PA-C 03/05/2020, 8:42 AM Pager: [phone]   Attending Note Patient seen and discussed with PA Ahmed Prima, I agree with her documentation. 87 yo female history of HTN, HL CKD 3, PSVT admitted with nausea and vomiting. CT A/P showed moderate stool burden. Cardiology  consulted for issues with SVT this admission. Transiently on dilt gtt. She denies any palpitations. Isolated episode of syncope while in the kitchen, denies any warning symptoms. Was standing and next thing she knew she was on the floor.      WBC 7.2 Hgb 12.5 Plt 142 K 4 Cr 0.91 TSH 1.6 hstrop 30--> COVID neg CXR no acute process EKG regualr tach with her baseline RBBB  PSVT, the tele at time of my seeing her is in transition from the ER to the floor as she just arrived on the floor. Will f/u monotoring, agree with beta blocker for now. Syncope unclear etiology. F/u echo, orthostatics pending, f/u telemetry   Carlyle Dolly MD

## 2020-03-05 NOTE — ED Notes (Signed)
PT at bedside.

## 2020-03-05 NOTE — ED Notes (Addendum)
Pt removed 22 from left forearm. Echo at bedside. Will re-start once Echo is complete.

## 2020-03-05 NOTE — Care Management Obs Status (Signed)
[facility] NOTIFICATION   Patient Details  Name: [name] MRN: [id number] Date of Birth: 15-Jul-1920   Medicare Observation Status Notification Given:  Yes    Natasha Bence, LCSW 03/05/2020, 3:27 PM

## 2020-03-06 ENCOUNTER — Telehealth: Payer: Self-pay

## 2020-03-06 DIAGNOSIS — Z515 Encounter for palliative care: Secondary | ICD-10-CM

## 2020-03-06 DIAGNOSIS — R55 Syncope and collapse: Secondary | ICD-10-CM

## 2020-03-06 DIAGNOSIS — I471 Supraventricular tachycardia: Secondary | ICD-10-CM

## 2020-03-06 DIAGNOSIS — Z7189 Other specified counseling: Secondary | ICD-10-CM

## 2020-03-06 LAB — BASIC METABOLIC PANEL
Anion gap: 10 (ref 5–15)
BUN: 19 mg/dL (ref 8–23)
CO2: 27 mmol/L (ref 22–32)
Calcium: 8.7 mg/dL — ABNORMAL LOW (ref 8.9–10.3)
Chloride: 100 mmol/L (ref 98–111)
Creatinine, Ser: 0.96 mg/dL (ref 0.44–1.00)
GFR calc Af Amer: 57 mL/min — ABNORMAL LOW (ref >60)
GFR calc non Af Amer: 50 mL/min — ABNORMAL LOW (ref >60)
Glucose, Bld: 80 mg/dL (ref 70–99)
Potassium: 3.4 mmol/L — ABNORMAL LOW (ref 3.5–5.1)
Sodium: 137 mmol/L (ref 135–145)

## 2020-03-06 LAB — CBC WITH DIFFERENTIAL/PLATELET
Abs Immature Granulocytes: 0.01 10*3/uL (ref 0.00–0.07)
Basophils Absolute: 0 10*3/uL (ref 0.0–0.1)
Basophils Relative: 0 %
Eosinophils Absolute: 0 10*3/uL (ref 0.0–0.5)
Eosinophils Relative: 1 %
HCT: 34.4 % — ABNORMAL LOW (ref 36.0–46.0)
Hemoglobin: 10.8 g/dL — ABNORMAL LOW (ref 12.0–15.0)
Immature Granulocytes: 0 %
Lymphocytes Relative: 18 %
Lymphs Abs: 1 10*3/uL (ref 0.7–4.0)
MCH: 27.5 pg (ref 26.0–34.0)
MCHC: 31.4 g/dL (ref 30.0–36.0)
MCV: 87.5 fL (ref 80.0–100.0)
Monocytes Absolute: 0.7 10*3/uL (ref 0.1–1.0)
Monocytes Relative: 11 %
Neutro Abs: 4.1 10*3/uL (ref 1.7–7.7)
Neutrophils Relative %: 70 %
Platelets: 128 10*3/uL — ABNORMAL LOW (ref 150–400)
RBC: 3.93 MIL/uL (ref 3.87–5.11)
RDW: 15.1 % (ref 11.5–15.5)
WBC: 5.8 10*3/uL (ref 4.0–10.5)
nRBC: 0 % (ref 0.0–0.2)

## 2020-03-06 LAB — MAGNESIUM: Magnesium: 1.9 mg/dL (ref 1.7–2.4)

## 2020-03-06 MED ORDER — POTASSIUM CHLORIDE CRYS ER 20 MEQ PO TBCR
40.0000 meq | EXTENDED_RELEASE_TABLET | Freq: Once | ORAL | Status: AC
  Administered 2020-03-06: 40 meq via ORAL
  Filled 2020-03-06: qty 2

## 2020-03-06 MED ORDER — METOPROLOL TARTRATE 50 MG PO TABS: 50.0000 mg | ORAL_TABLET | Freq: Two times a day (BID) | ORAL | 1 refills | Status: DC

## 2020-03-06 MED ORDER — AMLODIPINE BESYLATE 5 MG PO TABS
5.0000 mg | ORAL_TABLET | Freq: Every day | ORAL | Status: DC
  Administered 2020-03-06: 5 mg via ORAL
  Filled 2020-03-06: qty 1

## 2020-03-06 MED ORDER — AMLODIPINE BESYLATE 5 MG PO TABS: 5.0000 mg | ORAL_TABLET | Freq: Every day | ORAL | 1 refills | Status: DC

## 2020-03-06 MED ORDER — SENNOSIDES-DOCUSATE SODIUM 8.6-50 MG PO TABS
1.0000 | ORAL_TABLET | Freq: Every day | ORAL | 1 refills | Status: DC
Start: 2020-03-06 — End: 2020-06-23

## 2020-03-06 NOTE — Progress Notes (Signed)
NURSING PROGRESS NOTE  [name] [id number] Discharge Data: 03/06/2020 5:36 PM Attending Provider: Murlean Iba, MD WUJ:[id number], Shireen Quan, FNP     [name] to be D/C'd Home per MD order.  Discussed with the patient the After Visit Summary and all questions fully answered. All IV's discontinued with no bleeding noted. All belongings returned to patient for patient to take home.   Last Vital Signs:  Blood pressure (!) 150/66, pulse 75, temperature 97.9 F (36.6 C), resp. rate 19, height 5\' 6"  (1.676 m), weight 61 kg, SpO2 97 %.  Discharge Medication List Allergies as of 03/06/2020      Reactions   Cozaar [losartan] Swelling   Oral swelling       Medication List    TAKE these medications   amLODipine 5 MG tablet Commonly known as: NORVASC Take 1 tablet (5 mg total) by mouth daily. What changed:   medication strength  See the new instructions.   aspirin EC 81 MG tablet Take 81 mg by mouth daily.   esomeprazole 40 MG capsule Commonly known as: NEXIUM Take 1 capsule (40 mg total) by mouth daily.   fluticasone 50 MCG/ACT nasal spray Commonly known as: FLONASE INSTILL 2 SPRAYS IN EACH NOSTRIL ONCE A DAY   Klor-Con M20 20 MEQ tablet Generic drug: potassium chloride SA Take 1 tablet (20 mEq total) by mouth daily.   metoprolol tartrate 50 MG tablet Commonly known as: LOPRESSOR Take 1 tablet (50 mg total) by mouth 2 (two) times daily. What changed:   medication strength  how much to take   polyethylene glycol 17 g packet Commonly known as: MIRALAX / GLYCOLAX Take 17 g by mouth daily as needed.   senna-docusate 8.6-50 MG tablet Commonly known as: Senokot-S Take 1 tablet by mouth at bedtime.        Doristine Devoid, RN

## 2020-03-06 NOTE — Discharge Summary (Signed)
Physician Discharge Summary  [name] IEP:[id number] DOB: January 28, 1920 DOA: 03/04/2020  PCP: Loman Brooklyn, FNP  Admit date: 03/04/2020 Discharge date: 03/06/2020  Admitted From:  Home  Disposition:  Home with [facility]   Recommendations for Outpatient Follow-up:  1. Follow up with PCP in 1 weeks 2. Follow up with cardiology as scheduled.  3. 14 day event monitor placed prior to discharge  Home Health: PT, RN   Discharge Condition: STABLE   CODE STATUS: FULL    Brief Hospitalization Summary: Please see all hospital notes, images, labs for full details of the hospitalization. ADMISSION HPI:  [name] is a 86 y.o. female with medical history of hypertension, CKD stage III, SVT, hyperlipidemia, GERD, right bundle branch block, hypertension, left breast cancer presenting with abdominal pain that began on 03/02/2020.  The patient states that she has had on and off generalized abdominal pain.  The patient's son is at the bedside to supplement history.  He states that the patient has had intermittent abdominal pain usually when she gets constipated.  He states that she is supposed to take MiraLAX on a daily basis, but she does not.  This has resulted in constipation which resulted in her abdominal pain.  She denies any dysuria, hematuria, hematochezia, melena.  She has had some nausea without any emesis.  She has had some decreased oral intake for the past 2 to 3 days as a result of her symptoms.  She states that she has not been taking her MiraLAX regularly. In addition, the patient had a syncopal episode on 03/02/2020.  She states that she got up on the morning of 03/02/2020 and was not feeling her usual self.  She was feeling "a little sick.  Just not feeling right" when she woke up that morning.  She experience some nausea and then some dizziness.  The next thing she remembered was going to the kitchen and waking up on the kitchen floor.  The patient's son states that she has had these nausea and  dizzy spells in the past but has not resulted in syncope.  She denies any new medications.  She has not had any recent sick contacts.  There was no bowel or bladder incontinence nor did she bite her tongue.  She did not have any prodromal symptoms including headache, visual disturbance, chest pain, shortness of breath.    In the emergency department, the patient was afebrile hemodynamically stable with oxygen saturation 99% on room air.  Initially she was discovered to have a heart rate in the 140s.  EKG showed SVT with right bundle branch block.  The patient was given Lopressor 2.5 mg IV x1 with improvement of her SVT.  She was given 1 L normal saline.  CT of the abdomen and pelvis was obtained and showed bibasilar pulmonary nodules that were unchanged.  There was right lower lobe centrilobular nodularity.  There is no hydronephrosis although her bladder was distended.  There was moderate colonic stool causing some distention of the right colon.  There was some fluid in the endometrial canal but this was decreased.  Because of her syncope and SVT, the patient was admitted for further evaluation and treatment.  Brief Admission History:  86 y.o.femalewith medical history ofhypertension, CKD stage III, SVT, hyperlipidemia, GERD, right bundle branch block, hypertension, left breast cancer presenting with abdominal pain that began on 03/02/2020.  She also is having dizzy spells and syncope and recurrent SVT.   Assessment & Plan:   Active Problems:  PSVT (paroxysmal supraventricular tachycardia)   Right bundle branch block   Essential hypertension, benign   GERD (gastroesophageal reflux disease)   Constipation   Stage 3a chronic kidney disease   SVT (supraventricular tachycardia)   Syncope and collapse  1. Syncope - pt has had several episodes likely from recurrent SVT spells.  PT eval recommending HHPT.  Metoprolol dose increased to hopefully control SVT.  It seems much better controlled on  current dose of metoprolol 50 mg BID. Echo below. .  2. Recurrent SVT - seems better controlled on higher dose of metoprolol 50 mg BID. Follow.  Cardiology consultation appreciated.  Pt can discharge home.  Pt was sent home with a 14 day cardiac event monitor to be followed up outpatient.   3. Generalized abdominal pain - secondary to constipation - continue laxatives.  4. Stage 3a CKD - stable.  Follow.  5. Thrombocytopenia - stable.  6. Prediabetes - diet control, a1c 5.9%.  7. RBBB - stable, chronic.  8. Pulmonary nodules - outpatient follow up for ongoing surveillance.   DVT prophylaxis: enoxaparin  Code Status: full  Family Communication: son, daughter updated  Disposition: home with [facility]    2D Echocardiogram IMPRESSIONS  1. Left ventricular ejection fraction, by estimation, is 60 to 65%. The  left ventricle has normal function. The left ventricle has no regional  wall motion abnormalities. There is mild left ventricular hypertrophy.  Left ventricular diastolic parameters  are consistent with Grade I diastolic dysfunction (impaired relaxation).  Elevated left atrial pressure.  2. Right ventricular systolic function is normal. The right ventricular  size is normal. There is severely elevated pulmonary artery systolic  pressure.  3. The mitral valve is normal in structure. Trivial mitral valve  regurgitation. No evidence of mitral stenosis.  4. The aortic valve is tricuspid. Aortic valve regurgitation is not  visualized. No aortic stenosis is present.  5. The inferior vena cava is normal in size with greater than 50%  respiratory variability, suggesting right atrial pressure of 3 mmHg.   Discharge Diagnoses:  Active Problems:   PSVT (paroxysmal supraventricular tachycardia) (HCC)   Right bundle branch block   Essential hypertension, benign   GERD (gastroesophageal reflux disease)   Constipation   Stage 3a chronic kidney disease   SVT (supraventricular tachycardia)  (HCC)   Syncope and collapse   Discharge Instructions:  Allergies as of 03/06/2020      Reactions   Cozaar [losartan] Swelling   Oral swelling       Medication List    TAKE these medications   amLODipine 5 MG tablet Commonly known as: NORVASC Take 1 tablet (5 mg total) by mouth daily. What changed:   medication strength  See the new instructions.   aspirin EC 81 MG tablet Take 81 mg by mouth daily.   esomeprazole 40 MG capsule Commonly known as: NEXIUM Take 1 capsule (40 mg total) by mouth daily.   fluticasone 50 MCG/ACT nasal spray Commonly known as: FLONASE INSTILL 2 SPRAYS IN EACH NOSTRIL ONCE A DAY   Klor-Con M20 20 MEQ tablet Generic drug: potassium chloride SA Take 1 tablet (20 mEq total) by mouth daily.   metoprolol tartrate 50 MG tablet Commonly known as: LOPRESSOR Take 1 tablet (50 mg total) by mouth 2 (two) times daily. What changed:   medication strength  how much to take   polyethylene glycol 17 g packet Commonly known as: MIRALAX / GLYCOLAX Take 17 g by mouth daily as needed.   senna-docusate  8.6-50 MG tablet Commonly known as: Senokot-S Take 1 tablet by mouth at bedtime.       Follow-up Information    Loman Brooklyn, FNP. Schedule an appointment as soon as possible for a visit in 2 week(s).   Specialty: Family Medicine Contact information: [place] [address] [phone]        Arnoldo Lenis, MD .   Specialty: Cardiology Contact information: [place] [address] [phone]              Allergies  Allergen Reactions  . Cozaar [Losartan] Swelling    Oral swelling    Allergies as of 03/06/2020      Reactions   Cozaar [losartan] Swelling   Oral swelling       Medication List    TAKE these medications   amLODipine 5 MG tablet Commonly known as: NORVASC Take 1 tablet (5 mg total) by mouth daily. What changed:   medication strength  See the new instructions.    aspirin EC 81 MG tablet Take 81 mg by mouth daily.   esomeprazole 40 MG capsule Commonly known as: NEXIUM Take 1 capsule (40 mg total) by mouth daily.   fluticasone 50 MCG/ACT nasal spray Commonly known as: FLONASE INSTILL 2 SPRAYS IN EACH NOSTRIL ONCE A DAY   Klor-Con M20 20 MEQ tablet Generic drug: potassium chloride SA Take 1 tablet (20 mEq total) by mouth daily.   metoprolol tartrate 50 MG tablet Commonly known as: LOPRESSOR Take 1 tablet (50 mg total) by mouth 2 (two) times daily. What changed:   medication strength  how much to take   polyethylene glycol 17 g packet Commonly known as: MIRALAX / GLYCOLAX Take 17 g by mouth daily as needed.   senna-docusate 8.6-50 MG tablet Commonly known as: Senokot-S Take 1 tablet by mouth at bedtime.       Procedures/Studies: CT Abdomen Pelvis W Contrast  Result Date: 03/04/2020 CLINICAL DATA:  Abdominal pain and nausea EXAM: CT ABDOMEN AND PELVIS WITH CONTRAST TECHNIQUE: Multidetector CT imaging of the abdomen and pelvis was performed using the standard protocol following bolus administration of intravenous contrast. CONTRAST:  115mL OMNIPAQUE IOHEXOL 300 MG/ML  SOLN COMPARISON:  August 03, 2015 FINDINGS: Lower chest: Cardiomegaly with RIGHT atrial enlargement. Bibasilar tiny pulmonary nodules are unchanged comparison to prior. Mild RIGHT lower lobe centrilobular nodularity, likely infectious/inflammatory in etiology (series 5, image 5). Hepatobiliary: Hepatic steatosis. Status post cholecystectomy. Unchanged dilation of the common bile duct, most likely due to post cholecystectomy state. Pancreas: No adjacent inflammatory changes.  No suspicious masses. Spleen: Normal in size without focal abnormality. Adrenals/Urinary Tract: Unchanged wall thickening of the adrenal glands. The kidneys enhance symmetrically. Subcentimeter hypodense lesions are too small to accurately characterize. No hydronephrosis. Bladder is distended. Stomach/Bowel:  Small hiatal hernia. Moderate colonic stool burden predominantly within the distended RIGHT hemicolon. The appendix is unremarkable. No bowel obstruction. Vascular/Lymphatic: Aortic atherosclerosis. No enlarged abdominal or pelvic lymph nodes. Reproductive: Pessary. Revisualization of the dominant fibroid of the fundus measuring 6.6 cm. There is fluid within the endometrial canal, decreased in comparison to prior. Differential considerations include a necrotic fibroid. Hypodense fluid measures approximately 13 mm in thickness. Other: No free fluid. Musculoskeletal: Osteopenia. Mild wedging of L1, increased in comparison to prior. IMPRESSION: 1. Moderate colonic stool burden predominantly within the distended RIGHT hemicolon. 2. Mild wedging of L1, increased in comparison to prior study. Correlate with point tenderness. 3. Mild RIGHT lower lobe centrilobular  nodularity, likely infectious/inflammatory in etiology. 4. There is fluid within the endometrial canal, decreased in comparison to prior. Differential considerations include a necrotic fibroid. Recommend correlation with pelvic ultrasound on a nonemergent basis. Aortic Atherosclerosis (ICD10-I70.0). Electronically Signed   By: Valentino Saxon MD   On: 03/04/2020 12:24   DG Chest Portable 1 View  Result Date: 03/04/2020 CLINICAL DATA:  Pain EXAM: PORTABLE CHEST 1 VIEW COMPARISON:  May 04, 2014 FINDINGS: The cardiomediastinal silhouette is unchanged and enlarged in contour.Atherosclerotic calcifications of the tortuous thoracic aorta. No pleural effusion. No pneumothorax. Biapical pleuroparenchymal scarring, unchanged. Scattered bibasilar reticulonodular opacities. Status post cholecystectomy. LEFT breast surgical clips. Multilevel degenerative changes of the thoracic spine. IMPRESSION: Minimal scattered bibasilar reticulonodular opacities. Differential considerations include aspiration versus infection versus atelectasis. Electronically Signed   By:  Valentino Saxon MD   On: 03/04/2020 12:54   ECHOCARDIOGRAM COMPLETE  Result Date: 03/05/2020    ECHOCARDIOGRAM REPORT   Patient Name:   [name] Date of Exam: 03/05/2020 Medical Rec #:  [id number]         Height:       66.0 in Accession #:    [id number]        Weight:       134.5 lb Date of Birth:  1919-10-05         BSA:          1.689 m Patient Age:    25 years          BP:           157/88 mmHg Patient Gender: F                 HR:           90 bpm. Exam Location:  [facility] Procedure: 2D Echo Indications:    SVT (supraventricular tachycardia  History:        Patient has no prior history of Echocardiogram examinations.                 Risk Factors:Hypertension, Non-Smoker and Dyslipidemia. PSVT,                 RBBB, GERD.  Sonographer:    Leavy Cella RDCS (AE) Referring Phys: [phone] DAVID TAT IMPRESSIONS  1. Left ventricular ejection fraction, by estimation, is 60 to 65%. The left ventricle has normal function. The left ventricle has no regional wall motion abnormalities. There is mild left ventricular hypertrophy. Left ventricular diastolic parameters are consistent with Grade I diastolic dysfunction (impaired relaxation). Elevated left atrial pressure.  2. Right ventricular systolic function is normal. The right ventricular size is normal. There is severely elevated pulmonary artery systolic pressure.  3. The mitral valve is normal in structure. Trivial mitral valve regurgitation. No evidence of mitral stenosis.  4. The aortic valve is tricuspid. Aortic valve regurgitation is not visualized. No aortic stenosis is present.  5. The inferior vena cava is normal in size with greater than 50% respiratory variability, suggesting right atrial pressure of 3 mmHg. FINDINGS  Left Ventricle: Left ventricular ejection fraction, by estimation, is 60 to 65%. The left ventricle has normal function. The left ventricle has no regional wall motion abnormalities. The left ventricular internal cavity size was  normal in size. There is  mild left ventricular hypertrophy. Left ventricular diastolic parameters are consistent with Grade I diastolic dysfunction (impaired relaxation). Elevated left atrial pressure. Right Ventricle: The right ventricular size is normal. No increase in right ventricular wall thickness.  Right ventricular systolic function is normal. There is severely elevated pulmonary artery systolic pressure. The tricuspid regurgitant velocity is 3.60 m/s, and with an assumed right atrial pressure of 10 mmHg, the estimated right ventricular systolic pressure is 41.9 mmHg. Left Atrium: Left atrial size was normal in size. Right Atrium: Right atrial size was normal in size. Pericardium: There is no evidence of pericardial effusion. Mitral Valve: The mitral valve is normal in structure. There is mild thickening of the mitral valve leaflet(s). There is mild calcification of the mitral valve leaflet(s). Mild mitral annular calcification. Trivial mitral valve regurgitation. No evidence  of mitral valve stenosis. Tricuspid Valve: The tricuspid valve is normal in structure. Tricuspid valve regurgitation is mild . No evidence of tricuspid stenosis. Aortic Valve: The aortic valve is tricuspid. . There is mild thickening and mild calcification of the aortic valve. Aortic valve regurgitation is not visualized. Aortic regurgitation PHT measures 435 msec. No aortic stenosis is present. Mild aortic valve  annular calcification. There is mild thickening of the aortic valve. There is mild calcification of the aortic valve. Aortic valve mean gradient measures 4.3 mmHg. Aortic valve peak gradient measures 8.7 mmHg. Aortic valve area, by VTI measures 2.84 cm. Pulmonic Valve: The pulmonic valve was not well visualized. Pulmonic valve regurgitation is not visualized. No evidence of pulmonic stenosis. Aorta: The aortic root is normal in size and structure. Pulmonary Artery: Moderate pulmonary HTN, PASP is 55 mmHg. Venous: The  inferior vena cava is normal in size with greater than 50% respiratory variability, suggesting right atrial pressure of 3 mmHg. IAS/Shunts: The interatrial septum was not well visualized.  LEFT VENTRICLE PLAX 2D LVIDd:         3.63 cm  Diastology LVIDs:         2.36 cm  LV e' lateral:   5.33 cm/s LV PW:         1.10 cm  LV E/e' lateral: 14.6 LV IVS:        1.07 cm  LV e' medial:    5.66 cm/s LVOT diam:     2.00 cm  LV E/e' medial:  13.7 LV SV:         76 LV SV Index:   45 LVOT Area:     3.14 cm  RIGHT VENTRICLE RV S prime:     13.10 cm/s TAPSE (M-mode): 2.2 cm LEFT ATRIUM             Index       RIGHT ATRIUM           Index LA diam:        3.00 cm 1.78 cm/m  RA Area:     12.10 cm LA Vol (A2C):   46.5 ml 27.52 ml/m RA Volume:   27.10 ml  16.04 ml/m LA Vol (A4C):   29.7 ml 17.58 ml/m LA Biplane Vol: 39.4 ml 23.32 ml/m  AORTIC VALVE AV Area (Vmax):    2.40 cm AV Area (Vmean):   2.13 cm AV Area (VTI):     2.84 cm AV Vmax:           147.68 cm/s AV Vmean:          96.923 cm/s AV VTI:            0.266 m AV Peak Grad:      8.7 mmHg AV Mean Grad:      4.3 mmHg LVOT Vmax:         112.68 cm/s LVOT Vmean:  65.829 cm/s LVOT VTI:          0.241 m LVOT/AV VTI ratio: 0.91 AI PHT:            435 msec  AORTA Ao Root diam: 2.80 cm MITRAL VALVE                TRICUSPID VALVE MV Area (PHT): 3.48 cm     TR Peak grad:   51.8 mmHg MV Decel Time: 218 msec     TR Vmax:        360.00 cm/s MV E velocity: 77.70 cm/s MV A velocity: 109.00 cm/s  SHUNTS MV E/A ratio:  0.71         Systemic VTI:  0.24 m                             Systemic Diam: 2.00 cm Carlyle Dolly MD Electronically signed by Carlyle Dolly MD Signature Date/Time: 03/05/2020/12:41:08 PM    Final       Subjective:   Discharge Exam: Vitals:   03/05/20 2049 03/06/20 0619  BP: (!) 116/57 (!) 150/66  Pulse: 85 75  Resp: 19 19  Temp:  97.9 F (36.6 C)  SpO2: (!) 87% 97%   Vitals:   03/05/20 1954 03/05/20 1955 03/05/20 2049 03/06/20 0619  BP: (!)  117/97  (!) 116/57 (!) 150/66  Pulse: (!) 146 (!) 166 85 75  Resp: 19  19 19   Temp: 97.7 F (36.5 C)   97.9 F (36.6 C)  TempSrc:      SpO2: 100%  (!) 87% 97%  Weight:      Height:        General: Pt is alert, awake, not in acute distress Cardiovascular: RRR, S1/S2 +, no rubs, no gallops Respiratory: CTA bilaterally, no wheezing, no rhonchi Abdominal: Soft, NT, ND, bowel sounds + Extremities: no edema, no cyanosis   The results of significant diagnostics from this hospitalization (including imaging, microbiology, ancillary and laboratory) are listed below for reference.     Microbiology: Recent Results (from the past 240 hour(s))  SARS Coronavirus 2 by RT PCR (hospital order, performed in [facility] hospital lab) Nasopharyngeal Nasopharyngeal Swab     Status: None   Collection Time: 03/04/20  9:58 AM   Specimen: Nasopharyngeal Swab  Result Value Ref Range Status   SARS Coronavirus 2 NEGATIVE NEGATIVE Final    Comment: (NOTE) SARS-CoV-2 target nucleic acids are NOT DETECTED.  The SARS-CoV-2 RNA is generally detectable in upper and lower respiratory specimens during the acute phase of infection. The lowest concentration of SARS-CoV-2 viral copies this assay can detect is 250 copies / mL. A negative result does not preclude SARS-CoV-2 infection and should not be used as the sole basis for treatment or other patient management decisions.  A negative result may occur with improper specimen collection / handling, submission of specimen other than nasopharyngeal swab, presence of viral mutation(s) within the areas targeted by this assay, and inadequate number of viral copies (<250 copies / mL). A negative result must be combined with clinical observations, patient history, and epidemiological information.  Fact Sheet for Patients:   StrictlyIdeas.no  Fact Sheet for Healthcare Providers: BankingDealers.co.za  This test is not yet  approved or  cleared by the [place] FDA and has been authorized for detection and/or diagnosis of SARS-CoV-2 by FDA under an Emergency Use Authorization (EUA).  This EUA will remain in effect (meaning this test  can be used) for the duration of the COVID-19 declaration under Section 564(b)(1) of the Act, 21 U.S.C. section 360bbb-3(b)(1), unless the authorization is terminated or revoked sooner.  Performed at [facility], [address], [place], [place] [address]   Culture, Urine     Status: None   Collection Time: 03/04/20  1:54 PM   Specimen: Urine, Catheterized  Result Value Ref Range Status   Specimen Description   Final    URINE, CATHETERIZED Performed at [facility], [address] [place], [place] [address]    Special Requests   Final    NONE Performed at [facility], [address] [place], [place] [address]    Culture   Final    NO GROWTH Performed at [facility], [place] [address] [place], [place] [address]    Report Status 03/05/2020 FINAL  Final     Labs: BNP (last 3 results) No results for input(s): BNP in the last 8760 hours. Basic Metabolic Panel: Recent Labs  Lab 03/04/20 1010 03/05/20 0409 03/06/20 0543  NA 135 137 137  K 4.0 3.6 3.4*  CL 100 102 100  CO2 24 25 27   GLUCOSE 137* 130* 80  BUN 18 18 19   CREATININE 0.91 0.77 0.96  CALCIUM 8.9 8.9 8.7*  MG  --  2.0 1.9   Liver Function Tests: Recent Labs  Lab 03/04/20 1010  AST 19  ALT 18  ALKPHOS 48  BILITOT 1.4*  PROT 7.7  ALBUMIN 3.9   No results for input(s): LIPASE, AMYLASE in the last 168 hours. No results for input(s): AMMONIA in the last 168 hours. CBC: Recent Labs  Lab 03/04/20 1010 03/05/20 0409 03/06/20 0543  WBC 7.2 9.3 5.8  NEUTROABS 6.0  --  4.1  HGB 12.5 12.2 10.8*  HCT 39.7 39.6 34.4*  MCV 86.9 87.4 87.5  PLT 142* 149* 128*   Cardiac Enzymes: No results for input(s): CKTOTAL, CKMB, CKMBINDEX, TROPONINI in the last 168 hours. BNP: Invalid input(s):  POCBNP CBG: No results for input(s): GLUCAP in the last 168 hours. D-Dimer No results for input(s): DDIMER in the last 72 hours. Hgb A1c Recent Labs    03/04/20 1010  HGBA1C 5.9*   Lipid Profile No results for input(s): CHOL, HDL, LDLCALC, TRIG, CHOLHDL, LDLDIRECT in the last 72 hours. Thyroid function studies Recent Labs    03/04/20 1010  TSH 1.567   Anemia work up Recent Labs    03/04/20 1010 03/04/20 2003  VITAMINB12 435  --   FOLATE  --  32.8   Urinalysis    Component Value Date/Time   COLORURINE YELLOW 03/04/2020 1354   APPEARANCEUR CLEAR 03/04/2020 1354   LABSPEC >1.046 (H) 03/04/2020 1354   PHURINE 6.0 03/04/2020 1354   GLUCOSEU NEGATIVE 03/04/2020 1354   HGBUR NEGATIVE 03/04/2020 1354   BILIRUBINUR NEGATIVE 03/04/2020 1354   KETONESUR 5 (A) 03/04/2020 1354   PROTEINUR 30 (A) 03/04/2020 1354   UROBILINOGEN 0.2 04/20/2015 1445   NITRITE NEGATIVE 03/04/2020 1354   LEUKOCYTESUR NEGATIVE 03/04/2020 1354   Sepsis Labs Invalid input(s): PROCALCITONIN,  WBC,  LACTICIDVEN Microbiology Recent Results (from the past 240 hour(s))  SARS Coronavirus 2 by RT PCR (hospital order, performed in [place] hospital lab) Nasopharyngeal Nasopharyngeal Swab     Status: None   Collection Time: 03/04/20  9:58 AM   Specimen: Nasopharyngeal Swab  Result Value Ref Range Status   SARS Coronavirus 2 NEGATIVE NEGATIVE Final    Comment: (NOTE) SARS-CoV-2 target nucleic acids  are NOT DETECTED.  The SARS-CoV-2 RNA is generally detectable in upper and lower respiratory specimens during the acute phase of infection. The lowest concentration of SARS-CoV-2 viral copies this assay can detect is 250 copies / mL. A negative result does not preclude SARS-CoV-2 infection and should not be used as the sole basis for treatment or other patient management decisions.  A negative result may occur with improper specimen collection / handling, submission of specimen other than nasopharyngeal  swab, presence of viral mutation(s) within the areas targeted by this assay, and inadequate number of viral copies (<250 copies / mL). A negative result must be combined with clinical observations, patient history, and epidemiological information.  Fact Sheet for Patients:   StrictlyIdeas.no  Fact Sheet for Healthcare Providers: BankingDealers.co.za  This test is not yet approved or  cleared by the [place] FDA and has been authorized for detection and/or diagnosis of SARS-CoV-2 by FDA under an Emergency Use Authorization (EUA).  This EUA will remain in effect (meaning this test can be used) for the duration of the COVID-19 declaration under Section 564(b)(1) of the Act, 21 U.S.C. section 360bbb-3(b)(1), unless the authorization is terminated or revoked sooner.  Performed at [facility], [address] [place], [place] [address]   Culture, Urine     Status: None   Collection Time: 03/04/20  1:54 PM   Specimen: Urine, Catheterized  Result Value Ref Range Status   Specimen Description   Final    URINE, CATHETERIZED Performed at [facility], [address] [place], [place] [address]    Special Requests   Final    NONE Performed at [facility], [address] [place], [place] [address]    Culture   Final    NO GROWTH Performed at [facility], [place] [address], [place], [place] [address]    Report Status 03/05/2020 FINAL  Final   Time coordinating discharge: 32 minutes   SIGNED:  Irwin Brakeman, MD  [facility] 03/06/2020, 1:26 PM How to contact the [facility] Attending or Consulting provider [place] or covering provider during after hours [place], for this patient?  1. Check the care team in [facility] and look for a) attending/consulting [facility] provider listed and b) the [facility] team listed 2. Log into [email or web address] and use [place]'s universal password to access. If you do not have the password, please contact the hospital  operator. 3. Locate the [facility] provider you are looking for under [facility] and page to a number that you can be directly reached. 4. If you still have difficulty reaching the provider, please page the [facility] (Director on Call) for the Hospitalists listed on amion for assistance.

## 2020-03-06 NOTE — Discharge Instructions (Signed)
Fall Prevention in the Home, Adult Falls can cause injuries. They can happen to people of all ages. There are many things you can do to make your home safe and to help prevent falls. Ask for help when making these changes, if needed. What actions can I take to prevent falls? General Instructions  Use good lighting in all rooms. Replace any light bulbs that burn out.  Turn on the lights when you go into a dark area. Use night-lights.  Keep items that you use often in easy-to-reach places. Lower the shelves around your home if necessary.  Set up your furniture so you have a clear path. Avoid moving your furniture around.  Do not have throw rugs and other things on the floor that can make you trip.  Avoid walking on wet floors.  If any of your floors are uneven, fix them.  Add color or contrast paint or tape to clearly mark and help you see: ? Any grab bars or handrails. ? First and last steps of stairways. ? Where the edge of each step is.  If you use a stepladder: ? Make sure that it is fully opened. Do not climb a closed stepladder. ? Make sure that both sides of the stepladder are locked into place. ? Ask someone to hold the stepladder for you while you use it.  If there are any pets around you, be aware of where they are. What can I do in the bathroom?      Keep the floor dry. Clean up any water that spills onto the floor as soon as it happens.  Remove soap buildup in the tub or shower regularly.  Use non-skid mats or decals on the floor of the tub or shower.  Attach bath mats securely with double-sided, non-slip rug tape.  If you need to sit down in the shower, use a plastic, non-slip stool.  Install grab bars by the toilet and in the tub and shower. Do not use towel bars as grab bars. What can I do in the bedroom?  Make sure that you have a light by your bed that is easy to reach.  Do not use any sheets or blankets that are too big for your bed. They should not  hang down onto the floor.  Have a firm chair that has side arms. You can use this for support while you get dressed. What can I do in the kitchen?  Clean up any spills right away.  If you need to reach something above you, use a strong step stool that has a grab bar.  Keep electrical cords out of the way.  Do not use floor polish or wax that makes floors slippery. If you must use wax, use non-skid floor wax. What can I do with my stairs?  Do not leave any items on the stairs.  Make sure that you have a light switch at the top of the stairs and the bottom of the stairs. If you do not have them, ask someone to add them for you.  Make sure that there are handrails on both sides of the stairs, and use them. Fix handrails that are broken or loose. Make sure that handrails are as long as the stairways.  Install non-slip stair treads on all stairs in your home.  Avoid having throw rugs at the top or bottom of the stairs. If you do have throw rugs, attach them to the floor with carpet tape.  Choose a carpet that does   not hide the edge of the steps on the stairway.  Check any carpeting to make sure that it is firmly attached to the stairs. Fix any carpet that is loose or worn. What can I do on the outside of my home?  Use bright outdoor lighting.  Regularly fix the edges of walkways and driveways and fix any cracks.  Remove anything that might make you trip as you walk through a door, such as a raised step or threshold.  Trim any bushes or trees on the path to your home.  Regularly check to see if handrails are loose or broken. Make sure that both sides of any steps have handrails.  Install guardrails along the edges of any raised decks and porches.  Clear walking paths of anything that might make someone trip, such as tools or rocks.  Have any leaves, snow, or ice cleared regularly.  Use sand or salt on walking paths during winter.  Clean up any spills in your garage right  away. This includes grease or oil spills. What other actions can I take?  Wear shoes that: ? Have a low heel. Do not wear high heels. ? Have rubber bottoms. ? Are comfortable and fit you well. ? Are closed at the toe. Do not wear open-toe sandals.  Use tools that help you move around (mobility aids) if they are needed. These include: ? Canes. ? Walkers. ? Scooters. ? Crutches.  Review your medicines with your doctor. Some medicines can make you feel dizzy. This can increase your chance of falling. Ask your doctor what other things you can do to help prevent falls. Where to find more information  Centers for Disease Control and Prevention, STEADI: https://garcia.biz/  [employer] on Aging: BrainJudge.co.uk Contact a doctor if:  You are afraid of falling at home.  You feel weak, drowsy, or dizzy at home.  You fall at home. Summary  There are many simple things that you can do to make your home safe and to help prevent falls.  Ways to make your home safe include removing tripping hazards and installing grab bars in the bathroom.  Ask for help when making these changes in your home. This information is not intended to replace advice given to you by your health care provider. Make sure you discuss any questions you have with your health care provider. Document Revised: 11/09/2018 Document Reviewed: 03/03/2017 Elsevier Patient Education  [place].   IMPORTANT INFORMATION: PAY CLOSE ATTENTION   PHYSICIAN DISCHARGE INSTRUCTIONS  Follow with Primary care provider  Loman Brooklyn, FNP  and other consultants as instructed by your Hospitalist Physician  [place] IF SYMPTOMS COME BACK, WORSEN OR NEW PROBLEM DEVELOPS   Please note: You were cared for by a hospitalist during your hospital stay. Every effort will be made to forward records to your primary care provider.  You can request that your primary care provider  send for your hospital records if they have not received them.  Once you are discharged, your primary care physician will handle any further medical issues. Please note that NO REFILLS for any discharge medications will be authorized once you are discharged, as it is imperative that you return to your primary care physician (or establish a relationship with a primary care physician if you do not have one) for your post hospital discharge needs so that they can reassess your need for medications and monitor your lab values.  Please get a complete  blood count and chemistry panel checked by your Primary MD at your next visit, and again as instructed by your Primary MD.  Get Medicines reviewed and adjusted: Please take all your medications with you for your next visit with your Primary MD  Laboratory/radiological data: Please request your Primary MD to go over all hospital tests and procedure/radiological results at the follow up, please ask your primary care provider to get all Hospital records sent to his/her office.  In some cases, they will be blood work, cultures and biopsy results pending at the time of your discharge. Please request that your primary care provider follow up on these results.  If you are diabetic, please bring your blood sugar readings with you to your follow up appointment with primary care.    Please call and make your follow up appointments as soon as possible.    Also Note the following: If you experience worsening of your admission symptoms, develop shortness of breath, life threatening emergency, suicidal or homicidal thoughts you must seek medical attention immediately by calling 911 or calling your MD immediately  if symptoms less severe.  You must read complete instructions/literature along with all the possible adverse reactions/side effects for all the Medicines you take and that have been prescribed to you. Take any new Medicines after you have completely understood  and accpet all the possible adverse reactions/side effects.   Do not drive when taking Pain medications or sleeping medications (Benzodiazepines)  Do not take more than prescribed Pain, Sleep and Anxiety Medications. It is not advisable to combine anxiety,sleep and pain medications without talking with your primary care practitioner  Special Instructions: If you have smoked or chewed Tobacco  in the last 2 yrs please stop smoking, stop any regular Alcohol  and or any Recreational drug use.  Wear Seat belts while driving.  Do not drive if taking any narcotic, mind altering or controlled substances or recreational drugs or alcohol.

## 2020-03-06 NOTE — Consult Note (Signed)
Consultation Note Date: 03/06/2020   Patient Name: [name]  DOB: 15-Oct-1919  MRN: [id number]  Age / Sex: 86 y.o., female  PCP: Loman Brooklyn, FNP Referring Physician: Murlean Iba, MD  Reason for Consultation: Establishing goals of care  HPI/Patient Profile: 86 y.o. female  with past medical history of hypertension, CKD stage 3, SVT, hyperlipidemia, GERD, right bundle branch block, history of left breast cancer admitted on 03/04/2020 with abd pain from home. History of abd pain with constipation (misses Miralax daily doses frequently). Syncopal episode 03/02/20 awoke on kitchen floor. History of nausea and dizziness previously but no prior syncopal episodes. Noted to have SVT with right bundle branch block as well as moderate colonic stool causing distention of right colon. Metoprolol has been increased and seems to be helping with syncope/SVT.   Clinical Assessment and Goals of Care: I met today with Ms. Redinger. Ms. Badley is an extremely pleasant woman. She has no family/visitors at bedside at time of our visit. She is from home alone where she tells me she enjoys being by herself. She is hopeful to return home soon but not before she is medically ready. She shares that she has 4 sons and 1 daughter who are extremely supportive and will do anything that she requests or needs. She reports that her daughter will come stay with her if we believe she needs someone at home with her at time of discharge. During this conversation it is clear that her family is her pride and joy and she takes great pride in the way that she raised her children and that they are good people. She talks highly of her grandchildren and a granddaughter that married last year.   Upon discussions Ms. Anagnos reports that she has never had any of the difficult conversations with her children regarding her wishes directly but she does  feel that they would know her wishes and what she would want. We also discussed code status and Ms. Minetti tells me that she trusts her doctors, team, and family to do what is best for her. She tells me that she has had a good life and has no concerns.   I returned to bedside and her son is present. I reviewed the above with him. He states that they have had some conversations and they know that she would not want to linger on machines. We discussed the importance of what resuscitation would look like and mean for her and that this is not something that is decided in the moment but rather a decision that needs to be made prior to reaching that stage. I gave him the example of EMS coming into the home and that if they do not desire CPR/breathing machine that they would need a DNR form to prevent these things from happening. He understands and would like DNR form so I signed and provided this to him. I encouraged him to continue to talk as a family to plan for different scenarios and wishes. I gave him Hard Choices  booklet and Living Will as well.   We also discussed care at home and I recommended that a family member be with her for at least the first 24 hours if possible just as a precaution and monitor for any further dizziness/syncope.   All questions/concerns addressed. Emotional support provided.   Primary Decision Maker PATIENT    SUMMARY OF RECOMMENDATIONS   - Out of facility DNR provided at request but family still discussing the details of her wishes - Recommend outpatient palliative care to assist with Advance Directive conversations/decisions  Code Status/Advance Care Planning:  Leaning towards DNR status and accepted outpatient DNR form   Symptom Management:   Per attending and cardiology.   Palliative Prophylaxis:   Bowel Regimen and Delirium Protocol  Psycho-social/Spiritual:   Desire for further Chaplaincy support:no  Prognosis:   Unable to determine  Discharge  Planning: Home with Palliative Services      Primary Diagnoses: Present on Admission: . SVT (supraventricular tachycardia) ([place]) . Constipation . Right bundle branch block . PSVT (paroxysmal supraventricular tachycardia) ([place]) . GERD (gastroesophageal reflux disease) . Essential hypertension, benign . Stage 3a chronic kidney disease   I have reviewed the medical record, interviewed the patient and family, and examined the patient. The following aspects are pertinent.  Past Medical History:  Diagnosis Date  . Arthralgia   . Breast cancer ([place])    left breast   . Constipation   . Dyspepsia   . GERD (gastroesophageal reflux disease)   . High cholesterol   . Hypertension   . NSVT (nonsustained ventricular tachycardia) (HCC)    Short run of monomorphic VT in setting of hypokalemia during 09/2011 hospitalization. EF 60-65% by echo 09/26/11  . Right bundle branch block   . SVT (supraventricular tachycardia) ([place])    Noted 09/2011 responsive to cardizem  . Vertigo    Social History   Socioeconomic History  . Marital status: Widowed    Spouse name: Not on file  . Number of children: 4  . Years of education: Not on file  . Highest education level: Not on file  Occupational History  . Not on file  Tobacco Use  . Smoking status: Never Smoker  . Smokeless tobacco: Never Used  Vaping Use  . Vaping Use: Never used  Substance and Sexual Activity  . Alcohol use: No  . Drug use: No  . Sexual activity: Not Currently    Birth control/protection: Post-menopausal  Other Topics Concern  . Not on file  Social History Narrative   5 children - 1 passed away,    Lives alone - has alert   Social Determinants of Health   Financial Resource Strain:   . Difficulty of Paying Living Expenses:   Food Insecurity:   . Worried About Charity fundraiser in the Last Year:   . Arboriculturist in the Last Year:   Transportation Needs:   . Film/video editor (Medical):   Marland Kitchen Lack of  Transportation (Non-Medical):   Physical Activity:   . Days of Exercise per Week:   . Minutes of Exercise per Session:   Stress:   . Feeling of Stress :   Social Connections:   . Frequency of Communication with Friends and Family:   . Frequency of Social Gatherings with Friends and Family:   . Attends Religious Services:   . Active Member of Clubs or Organizations:   . Attends Archivist Meetings:   Marland Kitchen Marital Status:    Family  History  Problem Relation Age of Onset  . Hypertension Mother   . Tuberculosis Father   . Hypertension Father    Scheduled Meds: . aspirin EC  81 mg Oral Daily  . bisacodyl  10 mg Rectal Once  . enoxaparin (LOVENOX) injection  40 mg Subcutaneous Q24H  . fluticasone  2 spray Each Nare Daily  . metoprolol tartrate  50 mg Oral BID  . pantoprazole  80 mg Oral Q1200  . polyethylene glycol  17 g Oral Daily   Continuous Infusions: PRN Meds:.acetaminophen **OR** acetaminophen, metoprolol tartrate, ondansetron **OR** ondansetron (ZOFRAN) IV Allergies  Allergen Reactions  . Cozaar [Losartan] Swelling    Oral swelling    Review of Systems  Constitutional: Positive for fatigue. Negative for appetite change.  Respiratory: Negative for shortness of breath.   Gastrointestinal: Negative for abdominal pain and nausea.  Neurological: Negative for dizziness and syncope.    Physical Exam Vitals and nursing note reviewed.  Constitutional:      General: She is not in acute distress.    Appearance: She is ill-appearing.  Cardiovascular:     Rate and Rhythm: Normal rate and regular rhythm.  Abdominal:     Palpations: Abdomen is soft.  Neurological:     Mental Status: She is alert and oriented to person, place, and time.     Comments: Hard of hearing; awaiting hearing aide replacement     Vital Signs: BP (!) 150/66 (BP Location: Left Arm)   Pulse 75   Temp 97.9 F (36.6 C)   Resp 19   Ht 5' 6" (1.676 m)   Wt 61 kg   SpO2 97%   BMI 21.71 kg/m    Pain Scale: 0-10   Pain Score: 0-No pain   SpO2: SpO2: 97 % O2 Device:SpO2: 97 % O2 Flow Rate: .   IO: Intake/output summary:   Intake/Output Summary (Last 24 hours) at 03/06/2020 1025 Last data filed at 03/05/2020 1614 Gross per 24 hour  Intake 28.55 ml  Output --  Net 28.55 ml    LBM: Last BM Date: 03/03/20 Baseline Weight: Weight: 61.2 kg Most recent weight: Weight: 61 kg     Palliative Assessment/Data:     Time In/Out: 1030-1100, 1200-1220 Time Total: 50 min Greater than 50%  of this time was spent counseling and coordinating care related to the above assessment and plan.  Signed by: Vinie Sill, NP Palliative Medicine Team Pager # [phone] (M-F 8a-5p) Team Phone # [phone] (Nights/Weekends)

## 2020-03-06 NOTE — Telephone Encounter (Signed)
14 day event monitor placed serial [id number] Zio AT PSVT and syncope

## 2020-03-06 NOTE — TOC Initial Note (Addendum)
Transition of Care [facility]) - Initial/Assessment Note    Patient Details  Name: [name] MRN: [id number] Date of Birth: April 12, 1920  [facility] CM/SW Contact:    Maureen Lucks, RN Phone Number: 03/06/2020, 11:54 AM  Clinical Narrative:         Patient admitted with SVT. TOC spoke with [facility] yesterday. [name] is accept the referral for HHPT/RN. Cardiology is follow, patient will be 1-2 days of diuresing. TOC to follow.         Patient now has discharge order and [place] orders. Updated [name] with [facility].  Expected Discharge Plan: [place] Barriers to Discharge: Continued Medical Work up   Patient Goals and CMS Choice Patient states their goals for this hospitalization and ongoing recovery are:: to go home.      Expected Discharge Plan and Services Expected Discharge Plan: [place]: [place] ([place]) Date [place]: 03/06/20 Time [place]: 80 Representative spoke with at [place]: [name]  Prior Living Arrangements/Services       Do you feel safe going back to the place where you live?: Yes            Emotional Assessment      Alcohol / Substance Use: Not Applicable Psych Involvement: No (comment)  Admission diagnosis:  SVT (supraventricular tachycardia) (HCC) [I47.1] Tachycardia [R00.0] Generalized abdominal pain [R10.84] Constipation, unspecified constipation type [K59.00] Syncope, unspecified syncope type [R55] Patient Active Problem List   Diagnosis Date Noted  . SVT (supraventricular tachycardia) ([place]) 03/04/2020  . Syncope and collapse 03/04/2020  . Generalized abdominal pain   . Stage 3a chronic kidney disease 02/21/2020  . Constipation 05/04/2019  . Arthralgia of right hip 04/21/2015  . GERD (gastroesophageal reflux disease) 11/26/2014  . Hyperlipidemia 01/01/2014  . PSVT (paroxysmal supraventricular tachycardia) ([place]) 09/26/2011  . Right  bundle branch block 09/26/2011  . Essential hypertension, benign 09/26/2011   PCP:  Loman Brooklyn, FNP Pharmacy:   [id number] [place], [place] [address] Phone: [phone] Fax: [phone]

## 2020-03-06 NOTE — Progress Notes (Addendum)
Progress Note  Patient Name: [name] Date of Encounter: 03/06/2020  Primary Cardiologist: Carlyle Dolly, MD   Subjective   No recurrent symptoms. Feels well this AM and anxious to go home. No chest pain or palpitations. Asymptomatic with her arrhythmia overnight.   Inpatient Medications    Scheduled Meds: . amLODipine  5 mg Oral Daily  . aspirin EC  81 mg Oral Daily  . bisacodyl  10 mg Rectal Once  . enoxaparin (LOVENOX) injection  40 mg Subcutaneous Q24H  . fluticasone  2 spray Each Nare Daily  . metoprolol tartrate  50 mg Oral BID  . pantoprazole  80 mg Oral Q1200  . polyethylene glycol  17 g Oral Daily  . potassium chloride  40 mEq Oral Once   Continuous Infusions:  PRN Meds: acetaminophen **OR** acetaminophen, metoprolol tartrate, ondansetron **OR** ondansetron (ZOFRAN) IV   Vital Signs    Vitals:   03/05/20 1954 03/05/20 1955 03/05/20 2049 03/06/20 0619  BP: (!) 117/97  (!) 116/57 (!) 150/66  Pulse: (!) 146 (!) 166 85 75  Resp: 19  19 19   Temp: 97.7 F (36.5 C)   97.9 F (36.6 C)  TempSrc:      SpO2: 100%  (!) 87% 97%  Weight:      Height:        Intake/Output Summary (Last 24 hours) at 03/06/2020 1111 Last data filed at 03/05/2020 1614 Gross per 24 hour  Intake 28.55 ml  Output --  Net 28.55 ml    Last 3 Weights 03/05/2020 03/04/2020 02/21/2020  Weight (lbs) 134 lb 7.7 oz 135 lb 135 lb 6.4 oz  Weight (kg) 61 kg 61.236 kg 61.417 kg      Telemetry    Recurrent SVT last night with HR in the 130's around 2000 and 2200 - 2300. Has since been in NSR with HR in the 70's to 80's.  - Personally Reviewed  ECG    No new tracings.   Physical Exam   General: Well developed, elderly female appearing in no acute distress. Head: Normocephalic, atraumatic.  Neck: Supple without bruits, JVD not elevated. Lungs:  Resp regular and unlabored, CTA without wheezing or rales. Heart: RRR, S1, S2, no S3, S4, or murmur; no rub. Abdomen: Soft, non-tender,  non-distended with normoactive bowel sounds. No hepatomegaly. No rebound/guarding. No obvious abdominal masses. Extremities: No clubbing, cyanosis, or lower extremity edema. Distal pedal pulses are 2+ bilaterally. Neuro: Alert and oriented X 3. Moves all extremities spontaneously. Psych: Normal affect.  Labs    Chemistry Recent Labs  Lab 03/04/20 1010 03/05/20 0409 03/06/20 0543  NA 135 137 137  K 4.0 3.6 3.4*  CL 100 102 100  CO2 24 25 27   GLUCOSE 137* 130* 80  BUN 18 18 19   CREATININE 0.91 0.77 0.96  CALCIUM 8.9 8.9 8.7*  PROT 7.7  --   --   ALBUMIN 3.9  --   --   AST 19  --   --   ALT 18  --   --   ALKPHOS 48  --   --   BILITOT 1.4*  --   --   GFRNONAA 53* >60 50*  GFRAA >60 >60 57*  ANIONGAP 11 10 10      Hematology Recent Labs  Lab 03/04/20 1010 03/05/20 0409 03/06/20 0543  WBC 7.2 9.3 5.8  RBC 4.57 4.53 3.93  HGB 12.5 12.2 10.8*  HCT 39.7 39.6 34.4*  MCV 86.9 87.4 87.5  MCH 27.4  26.9 27.5  MCHC 31.5 30.8 31.4  RDW 15.6* 15.5 15.1  PLT 142* 149* 128*    Cardiac EnzymesNo results for input(s): TROPONINI in the last 168 hours. No results for input(s): TROPIPOC in the last 168 hours.   BNPNo results for input(s): BNP, PROBNP in the last 168 hours.   DDimer No results for input(s): DDIMER in the last 168 hours.   Radiology    CT Abdomen Pelvis W Contrast  Result Date: 03/04/2020 CLINICAL DATA:  Abdominal pain and nausea EXAM: CT ABDOMEN AND PELVIS WITH CONTRAST TECHNIQUE: Multidetector CT imaging of the abdomen and pelvis was performed using the standard protocol following bolus administration of intravenous contrast. CONTRAST:  116mL OMNIPAQUE IOHEXOL 300 MG/ML  SOLN COMPARISON:  August 03, 2015 FINDINGS: Lower chest: Cardiomegaly with RIGHT atrial enlargement. Bibasilar tiny pulmonary nodules are unchanged comparison to prior. Mild RIGHT lower lobe centrilobular nodularity, likely infectious/inflammatory in etiology (series 5, image 5). Hepatobiliary:  Hepatic steatosis. Status post cholecystectomy. Unchanged dilation of the common bile duct, most likely due to post cholecystectomy state. Pancreas: No adjacent inflammatory changes.  No suspicious masses. Spleen: Normal in size without focal abnormality. Adrenals/Urinary Tract: Unchanged wall thickening of the adrenal glands. The kidneys enhance symmetrically. Subcentimeter hypodense lesions are too small to accurately characterize. No hydronephrosis. Bladder is distended. Stomach/Bowel: Small hiatal hernia. Moderate colonic stool burden predominantly within the distended RIGHT hemicolon. The appendix is unremarkable. No bowel obstruction. Vascular/Lymphatic: Aortic atherosclerosis. No enlarged abdominal or pelvic lymph nodes. Reproductive: Pessary. Revisualization of the dominant fibroid of the fundus measuring 6.6 cm. There is fluid within the endometrial canal, decreased in comparison to prior. Differential considerations include a necrotic fibroid. Hypodense fluid measures approximately 13 mm in thickness. Other: No free fluid. Musculoskeletal: Osteopenia. Mild wedging of L1, increased in comparison to prior. IMPRESSION: 1. Moderate colonic stool burden predominantly within the distended RIGHT hemicolon. 2. Mild wedging of L1, increased in comparison to prior study. Correlate with point tenderness. 3. Mild RIGHT lower lobe centrilobular nodularity, likely infectious/inflammatory in etiology. 4. There is fluid within the endometrial canal, decreased in comparison to prior. Differential considerations include a necrotic fibroid. Recommend correlation with pelvic ultrasound on a nonemergent basis. Aortic Atherosclerosis (ICD10-I70.0). Electronically Signed   By: Valentino Saxon MD   On: 03/04/2020 12:24   DG Chest Portable 1 View  Result Date: 03/04/2020 CLINICAL DATA:  Pain EXAM: PORTABLE CHEST 1 VIEW COMPARISON:  May 04, 2014 FINDINGS: The cardiomediastinal silhouette is unchanged and enlarged in  contour.Atherosclerotic calcifications of the tortuous thoracic aorta. No pleural effusion. No pneumothorax. Biapical pleuroparenchymal scarring, unchanged. Scattered bibasilar reticulonodular opacities. Status post cholecystectomy. LEFT breast surgical clips. Multilevel degenerative changes of the thoracic spine. IMPRESSION: Minimal scattered bibasilar reticulonodular opacities. Differential considerations include aspiration versus infection versus atelectasis. Electronically Signed   By: Valentino Saxon MD   On: 03/04/2020 12:54   Cardiac Studies   Echocardiogram: 03/05/2020 IMPRESSIONS    1. Left ventricular ejection fraction, by estimation, is 60 to 65%. The  left ventricle has normal function. The left ventricle has no regional  wall motion abnormalities. There is mild left ventricular hypertrophy.  Left ventricular diastolic parameters  are consistent with Grade I diastolic dysfunction (impaired relaxation).  Elevated left atrial pressure.  2. Right ventricular systolic function is normal. The right ventricular  size is normal. There is severely elevated pulmonary artery systolic  pressure.  3. The mitral valve is normal in structure. Trivial mitral valve  regurgitation. No evidence of mitral stenosis.  4. The aortic valve is tricuspid. Aortic valve regurgitation is not  visualized. No aortic stenosis is present.  5. The inferior vena cava is normal in size with greater than 50%  respiratory variability, suggesting right atrial pressure of 3 mmHg.   Patient Profile     86 y.o. female w/ PMH of HTN, HLD, Stage 3 CKD, pSVT and breast cancer (s/p lumpectomy) who is currently admitted for evaluation of syncope. Cardiology consulted due to recurrent SVT.   Assessment & Plan    1. pSVT - She has a history of SVT and by review of her EKG's and telemetry this admission, they appear most consistent with recurrent SVT. She is asymptomatic with the episodes and denies any associated  palpitations.  - Mg 1.9 this AM. TSH WNL. K+ was 3.4 this AM and will order supplementation. Her echo shows a preserved EF of 60-65% with no regional WMA and no significant valve abnormalities.  - Lopressor has been titrated from 25mg  BID to 50mg  BID. Continue current dosing for now. She wants to go home today which seems reasonable. Can consider an outpatient monitor as outlined below.    2. Syncope - Unclear etiology as she was standing in her kitchen and woke up on the floor. She is at risk for a cardiac-induced arrhythmia given her recurrent SVT and underlying conduction disease. No evidence of significant bradycardia or post-termination pauses this admission.  - Will ask for orthostatic vital signs to be obtained prior to discharge. Will review with Dr. Harl Bowie in regards to arranging for an outpatient event monitor.   3. HTN - BP has been variable, at 116/57 last night and at 150/66 this AM. Continue Lopressor. Will restart Amlodipine at a lower dose of 5mg  daily.   4. Stage 2-3 CKD - Creatinine remains stable at 0.96 this AM.     For questions or updates, please contact [facility] Please consult [email or web address] for contact info under Cardiology/STEMI.   Arna Medici , PA-C 11:11 AM 03/06/2020 Pager: [phone]   Attending note Patient seen and discussed with PA Ahmed Prima, I agree with her documentation. Episodes of PSVT, has been started on beta blocker. From notes long prior history of PSVT. No clear etiology for her syncope at home, have not seen arrhythmias that would be consistent. Echo without significant findings. Will plan for 14 day outpatient monitor. Patient and son ask to f/u in [place] with Dr Jeralene Peters Floyce Stakes MD

## 2020-03-06 NOTE — Progress Notes (Signed)
Physical Therapy Treatment Patient Details Name: [name] MRN: [id number] DOB: October 03, 1919 Today's Date: 03/06/2020    History of Present Illness [name] is a 86 y.o. female with medical history of hypertension, CKD stage III, SVT, hyperlipidemia, GERD, right bundle branch block, hypertension, left breast cancer presenting with abdominal pain that began on 03/02/2020.  The patient states that she has had on and off generalized abdominal pain.  The patient's son is at the bedside to supplement history.  He states that the patient has had intermittent abdominal pain usually when she gets constipated.  He states that she is supposed to take MiraLAX on a daily basis, but she does not.  This has resulted in constipation which resulted in her abdominal pain.  She denies any dysuria, hematuria, hematochezia, melena.  She has had some nausea without any emesis.  She has had some decreased oral intake for the past 2 to 3 days as a result of her symptoms.  She states that she has not been taking her MiraLAX regularly.In addition, the patient had a syncopal episode on 03/02/2020.  She states that she got up on the morning of 03/02/2020 and was not feeling her usual self.  She was feeling "a little sick.  Just not feeling right" when she woke up that morning.  She experience some nausea and then some dizziness.  The next thing she remembered was going to the kitchen and waking up on the kitchen floor.  The patient's son states that she has had these nausea and dizzy spells in the past but has not resulted in syncope.  She denies any new medications.  She has not had any recent sick contacts.  There was no bowel or bladder incontinence nor did she bite her tongue.  She did not have any prodromal symptoms including headache, visual disturbance, chest pain, shortness of breath.    PT Comments    Patient presents seated at bedside, states she feels fatigued due to uncomfortable when lying in bed and agreeable  for therapy.  Patient demonstrates good return for completing BLE ROM/strengthening exercises while seated at bedside, fatigues easily during gait training having to lean on side rails in hallway using her quad-cane, had patient use RW for last 20 feet for more support, but she stated she would rather use quad-cane.  Patient tolerated sitting up in chair after therapy.  Patient will benefit from continued physical therapy in hospital and recommended venue below to increase strength, balance, endurance for safe ADLs and gait.   Follow Up Recommendations  Home health PT;Supervision - Intermittent;Supervision for mobility/OOB     Equipment Recommendations  None recommended by PT    Recommendations for Other Services       Precautions / Restrictions Precautions Precautions: Fall Restrictions Weight Bearing Restrictions: No    Mobility  Bed Mobility Overal bed mobility: Modified Independent                Transfers Overall transfer level: Needs assistance Equipment used: Rolling walker (2 wheeled);Quad cane;None Transfers: Sit to/from [employer] to Stand: Supervision Stand pivot transfers: Supervision       General transfer comment: has to lean on nearby objects for support when not using quad-cane or RW  Ambulation/Gait Ambulation/Gait assistance: Supervision;Min guard Gait Distance (Feet): 45 Feet Assistive device: Quad cane;Rolling walker (2 wheeled) Gait Pattern/deviations: Decreased step length - left;Decreased stance time - right;Decreased stride length;Step-to pattern Gait velocity: decreased   General Gait Details: slow labored cadence  with frequent use of side rail in hallway using Quad-cane, fair return for using RW, prefers to use Quad-cane, limited secondary to c/o fatigue and mild dizziness   Stairs             Wheelchair Mobility    Modified Rankin (Stroke Patients Only)       Balance Overall balance assessment: Needs  assistance Sitting-balance support: Feet supported;No upper extremity supported Sitting balance-Leahy Scale: Good Sitting balance - Comments: seated at EOB   Standing balance support: During functional activity;Single extremity supported Standing balance-Leahy Scale: Fair Standing balance comment: fair/good using quad-cane and RW                            Cognition Arousal/Alertness: Awake/alert Behavior During Therapy: WFL for tasks assessed/performed Overall Cognitive Status: Within Functional Limits for tasks assessed                                        Exercises General Exercises - Lower Extremity Ankle Circles/Pumps: Seated;AROM;Strengthening;Both;15 reps Long Arc Quad: Seated;AROM;Strengthening;Both;15 reps Hip Flexion/Marching: Seated;AROM;Strengthening;Both;15 reps    General Comments        Pertinent Vitals/Pain Pain Assessment: No/denies pain    Home Living                      Prior Function            PT Goals (current goals can now be found in the care plan section) Acute Rehab PT Goals Patient Stated Goal: return home with family/friends to assist PT Goal Formulation: With patient Time For Goal Achievement: 03/08/20 Potential to Achieve Goals: Good Progress towards PT goals: Progressing toward goals    Frequency    Min 3X/week      PT Plan Current plan remains appropriate    Co-evaluation              AM-PAC PT "6 Clicks" Mobility   Outcome Measure  Help needed turning from your back to your side while in a flat bed without using bedrails?: None Help needed moving from lying on your back to sitting on the side of a flat bed without using bedrails?: None Help needed moving to and from a bed to a chair (including a wheelchair)?: A Little Help needed standing up from a chair using your arms (e.g., wheelchair or bedside chair)?: None Help needed to walk in hospital room?: A Little Help needed  climbing 3-5 steps with a railing? : A Lot 6 Click Score: 20    End of Session   Activity Tolerance: Patient tolerated treatment well;Patient limited by fatigue Patient left: in chair;with call bell/phone within reach Nurse Communication: Mobility status PT Visit Diagnosis: Unsteadiness on feet (R26.81);Other abnormalities of gait and mobility (R26.89);Muscle weakness (generalized) (M62.81)     Time: 8413-2440 PT Time Calculation (min) (ACUTE ONLY): 22 min  Charges:  $Gait Training: 8-22 mins $Therapeutic Exercise: 8-22 mins                     2:03 PM, 03/06/20 Lonell Grandchild, MPT Physical Therapist with [facility] [phone] office [phone] mobile phone

## 2020-03-13 ENCOUNTER — Ambulatory Visit: Payer: Medicaid Other | Admitting: Obstetrics & Gynecology

## 2020-03-14 ENCOUNTER — Encounter: Payer: Self-pay | Admitting: Obstetrics & Gynecology

## 2020-03-14 ENCOUNTER — Ambulatory Visit (INDEPENDENT_AMBULATORY_CARE_PROVIDER_SITE_OTHER): Payer: Medicare Other | Admitting: Obstetrics & Gynecology

## 2020-03-14 VITALS — BP 128/64 | HR 84 | Wt 134.0 lb

## 2020-03-14 DIAGNOSIS — N813 Complete uterovaginal prolapse: Secondary | ICD-10-CM | POA: Diagnosis not present

## 2020-03-14 DIAGNOSIS — Z4689 Encounter for fitting and adjustment of other specified devices: Secondary | ICD-10-CM | POA: Diagnosis not present

## 2020-03-14 NOTE — Progress Notes (Signed)
Chief Complaint  Patient presents with  . Pessary Check    Blood pressure 128/64, pulse 84, weight 134 lb (60.8 kg).  [name] presents today for routine follow up related to her pessary.   She uses a Milex ring with support #4 She reports no vaginal discharge or vaginal bleeding.  Exam reveals no undue vaginal mucosal pressure of breakdown, no discharge and no vaginal bleeding.  The pessary is removed, cleaned and replaced without difficulty.      ICD-10-CM   1. Pessary maintenance, Milex ring with support #4, original fit 08/2015  Z46.89   2. Uterine procidentia  N81.3      KETURA SIREK will be sen back in 4 months for continued follow up.  Florian Buff, MD  03/14/2020 11:09 AM

## 2020-03-19 ENCOUNTER — Ambulatory Visit (INDEPENDENT_AMBULATORY_CARE_PROVIDER_SITE_OTHER): Payer: Medicare Other

## 2020-03-19 ENCOUNTER — Other Ambulatory Visit: Payer: Self-pay

## 2020-03-19 DIAGNOSIS — Z9181 History of falling: Secondary | ICD-10-CM

## 2020-03-19 DIAGNOSIS — K5900 Constipation, unspecified: Secondary | ICD-10-CM

## 2020-03-19 DIAGNOSIS — N1831 Chronic kidney disease, stage 3a: Secondary | ICD-10-CM

## 2020-03-19 DIAGNOSIS — R55 Syncope and collapse: Secondary | ICD-10-CM | POA: Diagnosis not present

## 2020-03-19 DIAGNOSIS — I129 Hypertensive chronic kidney disease with stage 1 through stage 4 chronic kidney disease, or unspecified chronic kidney disease: Secondary | ICD-10-CM

## 2020-03-19 DIAGNOSIS — R7303 Prediabetes: Secondary | ICD-10-CM

## 2020-03-19 DIAGNOSIS — E785 Hyperlipidemia, unspecified: Secondary | ICD-10-CM

## 2020-03-19 DIAGNOSIS — Z7902 Long term (current) use of antithrombotics/antiplatelets: Secondary | ICD-10-CM

## 2020-03-19 DIAGNOSIS — R911 Solitary pulmonary nodule: Secondary | ICD-10-CM

## 2020-03-19 DIAGNOSIS — I4510 Unspecified right bundle-branch block: Secondary | ICD-10-CM

## 2020-03-19 DIAGNOSIS — I471 Supraventricular tachycardia: Secondary | ICD-10-CM

## 2020-03-19 DIAGNOSIS — D696 Thrombocytopenia, unspecified: Secondary | ICD-10-CM

## 2020-03-19 DIAGNOSIS — C50912 Malignant neoplasm of unspecified site of left female breast: Secondary | ICD-10-CM

## 2020-03-19 DIAGNOSIS — K219 Gastro-esophageal reflux disease without esophagitis: Secondary | ICD-10-CM

## 2020-03-28 ENCOUNTER — Ambulatory Visit (INDEPENDENT_AMBULATORY_CARE_PROVIDER_SITE_OTHER): Payer: Medicare Other

## 2020-03-28 ENCOUNTER — Other Ambulatory Visit: Payer: Self-pay

## 2020-03-28 ENCOUNTER — Telehealth: Payer: Self-pay | Admitting: Student

## 2020-03-28 DIAGNOSIS — R55 Syncope and collapse: Secondary | ICD-10-CM

## 2020-03-28 DIAGNOSIS — I471 Supraventricular tachycardia: Secondary | ICD-10-CM

## 2020-03-28 NOTE — Telephone Encounter (Signed)
New message    Stat result for monitor   Ref # [id number]

## 2020-03-28 NOTE — Telephone Encounter (Signed)
Returned call to [employer]. Pt report uploaded. Pt had 60 sec run of SVT.

## 2020-03-28 NOTE — Addendum Note (Signed)
Addended by: Levonne Hubert on: 03/28/2020 04:19 PM   Modules accepted: Orders

## 2020-04-04 ENCOUNTER — Other Ambulatory Visit: Payer: Self-pay | Admitting: Family Medicine

## 2020-04-04 ENCOUNTER — Encounter: Payer: Self-pay | Admitting: Family Medicine

## 2020-04-04 DIAGNOSIS — I10 Essential (primary) hypertension: Secondary | ICD-10-CM

## 2020-04-04 DIAGNOSIS — N3941 Urge incontinence: Secondary | ICD-10-CM | POA: Insufficient documentation

## 2020-04-04 DIAGNOSIS — Z9181 History of falling: Secondary | ICD-10-CM | POA: Insufficient documentation

## 2020-04-15 NOTE — Progress Notes (Deleted)
Cardiology Office Note   Date:  04/15/2020   ID:  REMEDIOS MCKONE, DOB Jul 15, 1920, MRN [id number]  PCP:  Loman Brooklyn, FNP  Cardiologist:  Dr. Tamala Julian      No chief complaint on file.     History of Present Illness: [name] is a 86 y.o. female who presents for post hospitalization  She has a past medical history of HTN, HLD, Stage 3 CKD, pSVT and breast cancer (s/p lumpectomy) seen at [facility].   She was admitted for N&V, prior to admit possible syncope.  EKG shows a narrow-complex tachycardia, HR 148 with underlying RBBB. Placed on IV dilt then stopped and BB increased.  Difficult to say if SVt or 2:1 flutter.  converted to SR --SVT on 50 BID lopressor    outpt monitor 14 day    Past Medical History:  Diagnosis Date  . Arthralgia   . Breast cancer ([place])    left breast   . Constipation   . Dyspepsia   . GERD (gastroesophageal reflux disease)   . High cholesterol   . Hypertension   . NSVT (nonsustained ventricular tachycardia) ([facility])    Short run of monomorphic VT in setting of hypokalemia during 09/2011 hospitalization. EF 60-65% by echo 09/26/11  . Right bundle branch block   . SVT (supraventricular tachycardia) ([place])    Noted 09/2011 responsive to cardizem  . Vertigo     Past Surgical History:  Procedure Laterality Date  . BREAST LUMPECTOMY     left breast cancer years ago  . CHOLECYSTECTOMY    . EYE SURGERY     1- eye - cataract removal   . TONSILLECTOMY       Current Outpatient Medications  Medication Sig Dispense Refill  . amLODipine (NORVASC) 10 MG tablet TAKE 1 TABLET BY MOUTH EVERY DAY IN THE MORNING 90 tablet 0  . amLODipine (NORVASC) 5 MG tablet Take 1 tablet (5 mg total) by mouth daily. 30 tablet 1  . aspirin EC 81 MG tablet Take 81 mg by mouth daily.    Marland Kitchen esomeprazole (NEXIUM) 40 MG capsule Take 1 capsule (40 mg total) by mouth daily. 90 capsule 1  . fluticasone (FLONASE) 50 MCG/ACT nasal spray INSTILL 2 SPRAYS IN EACH NOSTRIL ONCE A  DAY 48 mL 6  . KLOR-CON M20 20 MEQ tablet Take 1 tablet (20 mEq total) by mouth daily. 90 tablet 1  . metoprolol tartrate (LOPRESSOR) 50 MG tablet Take 1 tablet (50 mg total) by mouth 2 (two) times daily. 60 tablet 1  . polyethylene glycol (MIRALAX / GLYCOLAX) 17 g packet Take 17 g by mouth daily as needed.    . senna-docusate (SENOKOT-S) 8.6-50 MG tablet Take 1 tablet by mouth at bedtime. 30 tablet 1   No current facility-administered medications for this visit.    Allergies:   Cozaar [losartan]    Social History:  The patient  reports that she has never smoked. She has never used smokeless tobacco. She reports that she does not drink alcohol and does not use drugs.   Family History:  The patient's ***family history includes Hypertension in her father and mother; Tuberculosis in her father.    ROS:  General:no colds or fevers, no weight changes Skin:no rashes or ulcers HEENT:no blurred vision, no congestion CV:see HPI PUL:see HPI GI:no diarrhea constipation or melena, no indigestion GU:no hematuria, no dysuria MS:no joint pain, no claudication Neuro:no syncope, no lightheadedness Endo:no diabetes, no thyroid disease Wt Readings from Last  3 Encounters:  03/14/20 134 lb (60.8 kg)  03/05/20 134 lb 7.7 oz (61 kg)  02/21/20 135 lb 6.4 oz (61.4 kg)     PHYSICAL EXAM: VS:  There were no vitals taken for this visit. , BMI There is no height or weight on file to calculate BMI. General:Pleasant affect, NAD Skin:Warm and dry, brisk capillary refill HEENT:normocephalic, sclera clear, mucus membranes moist Neck:supple, no JVD, no bruits  Heart:S1S2 RRR without murmur, gallup, rub or click Lungs:clear without rales, rhonchi, or wheezes NOI:BBCW, non tender, + BS, do not palpate liver spleen or masses Ext:no lower ext edema, 2+ pedal pulses, 2+ radial pulses Neuro:alert and oriented, MAE, follows commands, + facial symmetry    EKG:  EKG is ordered today. The ekg ordered today  demonstrates ***   Recent Labs: 03/04/2020: ALT 18; TSH 1.567 03/06/2020: BUN 19; Creatinine, Ser 0.96; Hemoglobin 10.8; Magnesium 1.9; Platelets 128; Potassium 3.4; Sodium 137    Lipid Panel    Component Value Date/Time   CHOL 158 11/20/2019 1051   TRIG 48 11/20/2019 1051   HDL 58 11/20/2019 1051   CHOLHDL 2.7 11/20/2019 1051   LDLCALC 90 11/20/2019 1051       Other studies Reviewed: Additional studies/ records that were reviewed today include: ***.  Echocardiogram: 08/2015 Study Conclusions   - Left ventricle: The cavity size was normal. Wall thickness was  increased in a pattern of mild LVH. Systolic function was normal.  The estimated ejection fraction was in the range of 55% to 60%.  Wall motion was normal; there were no regional wall motion  abnormalities. Doppler parameters are consistent with abnormal  left ventricular relaxation (grade 1 diastolic dysfunction).  Doppler parameters are consistent with high ventricular filling  pressure.  - Aortic valve: Mildly to moderately calcified annulus. Trileaflet;  mildly calcified leaflets. There was mild regurgitation.  - Mitral valve: Moderately calcified annulus. There was trivial  regurgitation.  - Left atrium: The atrium was mildly dilated.  - Right atrium: Central venous pressure (est): 3 mm Hg.  - Tricuspid valve: There was mild regurgitation.  - Pulmonary arteries: PA peak pressure: 39 mm Hg (S).  - Pericardium, extracardiac: There was no pericardial effusion.  ASSESSMENT AND PLAN:  1.  ***   Current medicines are reviewed with the patient today.  The patient Has no concerns regarding medicines.  The following changes have been made:  See above Labs/ tests ordered today include:see above  Disposition:   FU:  see above  Signed, Cecilie Kicks, NP  04/15/2020 3:04 PM    [facility] [address] [place],  [place] Phone: [phone]; Fax: [phone]

## 2020-04-17 ENCOUNTER — Ambulatory Visit: Payer: Medicaid Other | Admitting: Cardiology

## 2020-05-05 ENCOUNTER — Other Ambulatory Visit: Payer: Self-pay | Admitting: Family Medicine

## 2020-05-05 DIAGNOSIS — I10 Essential (primary) hypertension: Secondary | ICD-10-CM

## 2020-05-05 DIAGNOSIS — I471 Supraventricular tachycardia: Secondary | ICD-10-CM

## 2020-05-07 ENCOUNTER — Other Ambulatory Visit: Payer: Self-pay | Admitting: Family Medicine

## 2020-05-11 ENCOUNTER — Other Ambulatory Visit: Payer: Self-pay | Admitting: Family Medicine

## 2020-05-11 DIAGNOSIS — K219 Gastro-esophageal reflux disease without esophagitis: Secondary | ICD-10-CM

## 2020-05-12 ENCOUNTER — Other Ambulatory Visit: Payer: Self-pay | Admitting: Family Medicine

## 2020-05-15 NOTE — Progress Notes (Signed)
Cardiology Office Note   Date:  05/16/2020   ID:  [name], DOB [birth year], MRN [id number]  PCP:  Loman Brooklyn, FNP  Cardiologist:  Dr. Tamala Julian    Chief Complaint  Patient presents with   Hospitalization Follow-up      History of Present Illness: [name] is a 86 y.o. female who presents for post hospitalization   History of HTN, HLD, Stage 3 CKD, pSVT and breast cancer (s/p lumpectomy) was hospitalized, recently after she woke on ground.  Unsure how long she had been on ground.  She was in narrow complex tanchyacdia at 148 with RBBB placed on IV dilt her home lopressore increased. EF 60-65%,  outpt monitor with idioventricular and PSVT  Longest episode was 4 hours at 150 BPM pt has been asymptomatic.   No chest pain no SOB no dizziness.  No lightheadedness.  She has underlying RBBB and on EKG no 1 first degree AV block.   She is active at home, does own housekeeping and cooking.  She sits at computer most of day though.   Her son is with her today.  She is hard of hearing.    Past Medical History:  Diagnosis Date   Arthralgia    Breast cancer ([place])    left breast    Constipation    Dyspepsia    GERD (gastroesophageal reflux disease)    High cholesterol    Hypertension    NSVT (nonsustained ventricular tachycardia) (H[facility])    Short run of monomorphic VT in setting of hypokalemia during 09/2011 hospitalization. EF 60-65% by echo 09/26/11   Right bundle branch block    SVT (supraventricular tachycardia) ([place])    Noted 09/2011 responsive to cardizem   Vertigo     Past Surgical History:  Procedure Laterality Date   BREAST LUMPECTOMY     left breast cancer years ago   CHOLECYSTECTOMY     EYE SURGERY     1- eye - cataract removal    TONSILLECTOMY       Current Outpatient Medications  Medication Sig Dispense Refill   amLODipine (NORVASC) 10 MG tablet Take 1 tablet (10 mg total) by mouth daily. 90 tablet 3   aspirin EC 81 MG tablet  Take 81 mg by mouth daily.     esomeprazole (NEXIUM) 40 MG capsule TAKE 1 CAPSULE BY MOUTH EVERY DAY 90 capsule 0   fluticasone (FLONASE) 50 MCG/ACT nasal spray INSTILL 2 SPRAYS IN EACH NOSTRIL ONCE A DAY (Patient taking differently: as needed. ) 48 mL 6   KLOR-CON M20 20 MEQ tablet Take 1 tablet (20 mEq total) by mouth daily. 90 tablet 1   metoprolol tartrate (LOPRESSOR) 50 MG tablet TAKE 1 TABLET BY MOUTH TWICE A DAY 180 tablet 1   polyethylene glycol (MIRALAX / GLYCOLAX) 17 g packet Take 17 g by mouth daily.      senna-docusate (SENOKOT-S) 8.6-50 MG tablet Take 1 tablet by mouth at bedtime. (Patient taking differently: Take 1 tablet by mouth as needed. ) 30 tablet 1   No current facility-administered medications for this visit.    Allergies:   Cozaar [losartan]    Social History:  The patient  reports that she has never smoked. She has never used smokeless tobacco. She reports that she does not drink alcohol and does not use drugs.   Family History:  The patient's family history includes Hypertension in her father and mother; Tuberculosis in her father.    ROS:  General:no colds or fevers, + weight loss Skin:no rashes or ulcers HEENT:no blurred vision, no congestion CV:see HPI PUL:see HPI GI:no diarrhea constipation or melena, no indigestion GU:no hematuria, no dysuria MS:no joint pain, no claudication Neuro:no syncope, no lightheadedness Endo:no diabetes, no thyroid disease  Wt Readings from Last 3 Encounters:  05/16/20 129 lb (58.5 kg)  03/14/20 134 lb (60.8 kg)  03/05/20 134 lb 7.7 oz (61 kg)     PHYSICAL EXAM: VS:  BP 122/72    Pulse 84    Ht 5' 5" (1.651 m)    Wt 129 lb (58.5 kg)    SpO2 100%    BMI 21.47 kg/m  , BMI Body mass index is 21.47 kg/m. General:Pleasant affect, NAD Skin:Warm and dry, brisk capillary refill HEENT:normocephalic, sclera clear, mucus membranes moist Neck:supple, no JVD, no bruits  Heart:S1S2 RRR without murmur, gallup, rub or  click Lungs:clear without rales, rhonchi, or wheezes NTI:RWER, non tender, + BS, do not palpate liver spleen or masses Ext:no lower ext edema, 2+ pedal pulses, 2+ radial pulses Neuro:alert and oriented X 3, MAE, follows commands, + facial symmetry    EKG:  EKG is NOT ordered today.    Recent Labs: 03/04/2020: ALT 18; TSH 1.567 03/06/2020: BUN 19; Creatinine, Ser 0.96; Hemoglobin 10.8; Magnesium 1.9; Platelets 128; Potassium 3.4; Sodium 137    Lipid Panel    Component Value Date/Time   CHOL 158 11/20/2019 1051   TRIG 48 11/20/2019 1051   HDL 58 11/20/2019 1051   CHOLHDL 2.7 11/20/2019 1051   LDLCALC 90 11/20/2019 1051       Other studies Reviewed: Additional studies/ records that were reviewed today include:  . Event monitor  Preliminary Findings Patient had a min HR of 54 bpm, max HR of 207 bpm, and avg HR of 79 bpm. Predominant underlying rhythm was Sinus Rhythm. First Degree AV Block was present. Slight P wave morphology changes were noted. 1 run of Ventricular Tachycardia occurred lasting 5 beats with a max rate of 160 bpm (avg 109 bpm). 4448 Supraventricular Tachycardia runs occurred, the run with the fastest interval lasting 37 mins 23 secs with a max rate of 207 bpm, the longest lasting 4 hours 29 mins with an avg rate of 152 bpm. Isolated SVEs were occasional (3.4%, 51227), SVE Couplets were frequent (12.6%, 95219), and SVE Triplets were occasional (3.7%, 18771). Isolated VEs were rare (<1.0%, 2316), VE Couplets were rare (<1.0%, 40), and VE Triplets were rare (<1.0%, 1). MD notification criteria for Supraventricular Tachycardia met - report posted prior to notification per account request (MW).  ASSESSMENT AND PLAN:  1.  PSVT found after pt had syncope.  Though she continued in the SVT after she woke.  On event monitor she had up to 4 hours of SVT at 150.  She was unaware, no syncope and dizziness since discharge from the hospital.  Her EF was normal.  No atrial  fib.  She did have some idioventricular rhythm.   Reviewed with Dr. Tamala Julian. For now will monitor.  Concern with RBBB and increasing BB for bradycardia.  He discussed with pt's son and pt she may need PPM in future for tachybrady syndrome.   2.  Chronic RBBB  3.  HTN will decrease amlodipine to 10 mg from 15, if future if need be will increase BB and decrease amlodipine.     She will follow up with Dr. Tamala Julian in 4 months.      4.  Syncope no further episodes, pt  family know to return to Er if re-occurs ° ° °Current medicines are reviewed with the patient today.  The patient Has no concerns regarding medicines. ° °The following changes have been made:  See above °Labs/ tests ordered today include:see above ° °Disposition:   FU:  see above ° °Signed, ° , NP  °05/16/2020 11:05 AM    °[facility] °[address], [place], [place]  [address]/ °[address] [place], [place] °Phone: [phone]; Fax: [phone]  °[phone] °

## 2020-05-16 ENCOUNTER — Encounter: Payer: Self-pay | Admitting: Cardiology

## 2020-05-16 ENCOUNTER — Telehealth: Payer: Self-pay | Admitting: Cardiology

## 2020-05-16 ENCOUNTER — Other Ambulatory Visit: Payer: Self-pay

## 2020-05-16 ENCOUNTER — Ambulatory Visit (INDEPENDENT_AMBULATORY_CARE_PROVIDER_SITE_OTHER): Payer: Medicare Other | Admitting: Cardiology

## 2020-05-16 VITALS — BP 122/72 | HR 84 | Ht 65.0 in | Wt 129.0 lb

## 2020-05-16 DIAGNOSIS — I10 Essential (primary) hypertension: Secondary | ICD-10-CM | POA: Diagnosis not present

## 2020-05-16 DIAGNOSIS — I4510 Unspecified right bundle-branch block: Secondary | ICD-10-CM | POA: Diagnosis not present

## 2020-05-16 DIAGNOSIS — R55 Syncope and collapse: Secondary | ICD-10-CM | POA: Diagnosis not present

## 2020-05-16 DIAGNOSIS — I471 Supraventricular tachycardia, unspecified: Secondary | ICD-10-CM

## 2020-05-16 MED ORDER — AMLODIPINE BESYLATE 10 MG PO TABS
10.0000 mg | ORAL_TABLET | Freq: Every day | ORAL | 3 refills | Status: DC
Start: 2020-05-16 — End: 2020-06-25

## 2020-05-16 NOTE — Telephone Encounter (Signed)
Returned call to pt's daughter and she just wanted to clarify that pt wasn't taking but 1 Amlodipine and that was the 10 mg tablet.

## 2020-05-16 NOTE — Telephone Encounter (Signed)
Pt just saw Cecilie Kicks, NP today.

## 2020-05-16 NOTE — Telephone Encounter (Signed)
    Pt c/o medication issue:  1. Name of Medication:  amLODipine (NORVASC) 10 MG tablet    metoprolol tartrate (LOPRESSOR) 50 MG tablet    2. How are you currently taking this medication (dosage and times per day)?   3. Are you having a reaction (difficulty breathing--STAT)?   4. What is your medication issue? Bertie called, she would like to speak with a nurse to get clarification for these 2 medications

## 2020-05-16 NOTE — Patient Instructions (Addendum)
Medication Instructions:  Your physician has recommended you make the following change in your medication:  1.  REDUCE the Amlodipine to only taking the 10 mg 1 daily   *If you need a refill on your cardiac medications before your next appointment, please call your pharmacy*   Lab Work:  If you have labs (blood work) drawn today and your tests are completely normal, you will receive your results only by: Marland Kitchen MyChart Message (if you have MyChart) OR . A paper copy in the mail If you have any lab test that is abnormal or we need to change your treatment, we will call you to review the results.   Testing/Procedures:  Follow-Up: At [facility], you and your health needs are our priority.  As part of our continuing mission to provide you with exceptional heart care, we have created designated Provider Care Teams.  These Care Teams include your primary Cardiologist (physician) and Advanced Practice Providers (APPs -  Physician Assistants and Nurse Practitioners) who all work together to provide you with the care you need, when you need it.  We recommend signing up for the patient portal called "MyChart".  Sign up information is provided on this After Visit Summary.  MyChart is used to connect with patients for Virtual Visits (Telemedicine).  Patients are able to view lab/test results, encounter notes, upcoming appointments, etc.  Non-urgent messages can be sent to your provider as well.   To learn more about what you can do with MyChart, go to [email or web address].    Your next appointment:   4 month(s)  09/09/2020 ARRIVE AT 3:25   The format for your next appointment:   In Person  Provider:   Daneen Schick, MD   Other Instructions

## 2020-05-26 ENCOUNTER — Emergency Department (HOSPITAL_COMMUNITY)
Admission: EM | Admit: 2020-05-26 | Discharge: 2020-05-26 | Disposition: A | Discharge status: Home / Self Care | Payer: Medicare Other | Attending: Emergency Medicine | Admitting: Emergency Medicine

## 2020-05-26 ENCOUNTER — Encounter (HOSPITAL_COMMUNITY): Payer: Self-pay

## 2020-05-26 ENCOUNTER — Emergency Department (HOSPITAL_COMMUNITY): Payer: Medicare Other

## 2020-05-26 ENCOUNTER — Other Ambulatory Visit: Payer: Self-pay

## 2020-05-26 DIAGNOSIS — Z7982 Long term (current) use of aspirin: Secondary | ICD-10-CM | POA: Diagnosis not present

## 2020-05-26 DIAGNOSIS — I471 Supraventricular tachycardia, unspecified: Secondary | ICD-10-CM

## 2020-05-26 DIAGNOSIS — I129 Hypertensive chronic kidney disease with stage 1 through stage 4 chronic kidney disease, or unspecified chronic kidney disease: Secondary | ICD-10-CM | POA: Diagnosis not present

## 2020-05-26 DIAGNOSIS — Z853 Personal history of malignant neoplasm of breast: Secondary | ICD-10-CM | POA: Diagnosis not present

## 2020-05-26 DIAGNOSIS — Z79899 Other long term (current) drug therapy: Secondary | ICD-10-CM | POA: Insufficient documentation

## 2020-05-26 DIAGNOSIS — N1831 Chronic kidney disease, stage 3a: Secondary | ICD-10-CM | POA: Insufficient documentation

## 2020-05-26 DIAGNOSIS — R110 Nausea: Secondary | ICD-10-CM | POA: Diagnosis present

## 2020-05-26 LAB — CBC WITH DIFFERENTIAL/PLATELET
Abs Immature Granulocytes: 0 10*3/uL (ref 0.00–0.07)
Basophils Absolute: 0 10*3/uL (ref 0.0–0.1)
Basophils Relative: 0 %
Eosinophils Absolute: 0 10*3/uL (ref 0.0–0.5)
Eosinophils Relative: 0 %
HCT: 39.2 % (ref 36.0–46.0)
Hemoglobin: 12 g/dL (ref 12.0–15.0)
Immature Granulocytes: 0 %
Lymphocytes Relative: 17 %
Lymphs Abs: 0.6 10*3/uL — ABNORMAL LOW (ref 0.7–4.0)
MCH: 26.8 pg (ref 26.0–34.0)
MCHC: 30.6 g/dL (ref 30.0–36.0)
MCV: 87.5 fL (ref 80.0–100.0)
Monocytes Absolute: 0.2 10*3/uL (ref 0.1–1.0)
Monocytes Relative: 6 %
Neutro Abs: 2.7 10*3/uL (ref 1.7–7.7)
Neutrophils Relative %: 77 %
Platelets: 160 10*3/uL (ref 150–400)
RBC: 4.48 MIL/uL (ref 3.87–5.11)
RDW: 15.5 % (ref 11.5–15.5)
WBC: 3.5 10*3/uL — ABNORMAL LOW (ref 4.0–10.5)
nRBC: 0 % (ref 0.0–0.2)

## 2020-05-26 LAB — TROPONIN I (HIGH SENSITIVITY): Troponin I (High Sensitivity): 23 ng/L — ABNORMAL HIGH (ref <18)

## 2020-05-26 LAB — COMPREHENSIVE METABOLIC PANEL
ALT: 12 U/L (ref 0–44)
AST: 18 U/L (ref 15–41)
Albumin: 3.7 g/dL (ref 3.5–5.0)
Alkaline Phosphatase: 50 U/L (ref 38–126)
Anion gap: 9 (ref 5–15)
BUN: 17 mg/dL (ref 8–23)
CO2: 25 mmol/L (ref 22–32)
Calcium: 9.4 mg/dL (ref 8.9–10.3)
Chloride: 101 mmol/L (ref 98–111)
Creatinine, Ser: 0.96 mg/dL (ref 0.44–1.00)
GFR, Estimated: 53 mL/min — ABNORMAL LOW (ref >60)
Glucose, Bld: 118 mg/dL — ABNORMAL HIGH (ref 70–99)
Potassium: 4 mmol/L (ref 3.5–5.1)
Sodium: 135 mmol/L (ref 135–145)
Total Bilirubin: 1 mg/dL (ref 0.3–1.2)
Total Protein: 7 g/dL (ref 6.5–8.1)

## 2020-05-26 LAB — BRAIN NATRIURETIC PEPTIDE: B Natriuretic Peptide: 203 pg/mL — ABNORMAL HIGH (ref 0.0–100.0)

## 2020-05-26 MED ORDER — METOPROLOL TARTRATE 5 MG/5ML IV SOLN
5.0000 mg | Freq: Once | INTRAVENOUS | Status: AC
  Administered 2020-05-26: 5 mg via INTRAVENOUS
  Filled 2020-05-26: qty 5

## 2020-05-26 MED ORDER — METOPROLOL TARTRATE 75 MG PO TABS: 75.0000 mg | ORAL_TABLET | Freq: Two times a day (BID) | ORAL | 1 refills | Status: DC

## 2020-05-26 NOTE — ED Provider Notes (Signed)
[facility] EMERGENCY DEPARTMENT Provider Note   CSN: [id number] Arrival date & time: 05/26/20  1143     History Chief Complaint  Patient presents with  . Tachycardia    [name] is a 86 y.o. female.  HPI   This patient is a 86 year old female, she has a history of SVT which is recurrent, she is currently on a beta-blocker, was admitted to the hospital in August approximately 2 months ago after having a syncopal episode with SVT.  It was noted that she had multiple runs of SVT on a cardiac Holter monitor which she wore for 2 weeks.  The recommendations was for supportive care but possible pacemaker placement if she continued to have this happen.  She comes in today after having some symptoms including generally not feeling well and nausea.  She denies constipation but is not had a bowel movement today.  The son who is the primary historian who accompanies the patient to the hospital states that she does get constipated frequently and requires almost daily MiraLAX, if she misses MiraLAX she has had increasing abdominal discomfort.  I have reviewed the medical record showing that above findings.  The patient states she is not short of breath or having nausea at this time but does feel little bit weak.  It was noted that she was tachycardic by her home health aide.  Past Medical History:  Diagnosis Date  . Arthralgia   . Breast cancer ([place])    left breast   . Constipation   . Dyspepsia   . GERD (gastroesophageal reflux disease)   . High cholesterol   . Hypertension   . NSVT (nonsustained ventricular tachycardia) ([facility])    Short run of monomorphic VT in setting of hypokalemia during 09/2011 hospitalization. EF 60-65% by echo 09/26/11  . Right bundle branch block   . SVT (supraventricular tachycardia) ([place])    Noted 09/2011 responsive to cardizem  . Vertigo     Patient Active Problem List   Diagnosis Date Noted  . At high risk for falls 04/04/2020  . Urge incontinence  04/04/2020  . Goals of care, counseling/discussion   . Palliative care by specialist   . SVT (supraventricular tachycardia) ([place]) 03/04/2020  . Syncope and collapse 03/04/2020  . Generalized abdominal pain   . Stage 3a chronic kidney disease ([place]) 02/21/2020  . Constipation 05/04/2019  . Arthralgia of right hip 04/21/2015  . GERD (gastroesophageal reflux disease) 11/26/2014  . Hyperlipidemia 01/01/2014  . PSVT (paroxysmal supraventricular tachycardia) ([place]) 09/26/2011  . Right bundle branch block 09/26/2011  . Essential hypertension, benign 09/26/2011    Past Surgical History:  Procedure Laterality Date  . BREAST LUMPECTOMY     left breast cancer years ago  . CHOLECYSTECTOMY    . EYE SURGERY     1- eye - cataract removal   . TONSILLECTOMY       OB History    Gravida  5   Para  5   Term  5   Preterm      AB      Living  4     SAB      TAB      Ectopic      Multiple      Live Births  5           Family History  Problem Relation Age of Onset  . Hypertension Mother   . Tuberculosis Father   . Hypertension Father     Social  History   Tobacco Use  . Smoking status: Never Smoker  . Smokeless tobacco: Never Used  Vaping Use  . Vaping Use: Never used  Substance Use Topics  . Alcohol use: No  . Drug use: No    Home Medications Prior to Admission medications   Medication Sig Start Date End Date Taking? Authorizing Provider  amLODipine (NORVASC) 10 MG tablet Take 1 tablet (10 mg total) by mouth daily. 05/16/20 08/14/20  Isaiah Serge, NP  aspirin EC 81 MG tablet Take 81 mg by mouth daily.    [provider]  esomeprazole (NEXIUM) 40 MG capsule TAKE 1 CAPSULE BY MOUTH EVERY DAY 05/12/20   Loman Brooklyn, FNP  fluticasone (FLONASE) 50 MCG/ACT nasal spray INSTILL 2 SPRAYS IN EACH NOSTRIL ONCE A DAY Patient taking differently: as needed.  11/19/19   Loman Brooklyn, FNP  KLOR-CON M20 20 MEQ tablet Take 1 tablet (20 mEq total) by mouth  daily. 11/20/19   Loman Brooklyn, FNP  Metoprolol Tartrate 75 MG TABS Take 75 mg by mouth 2 (two) times daily. 05/26/20 06/25/20  [name], MD  polyethylene glycol (MIRALAX / GLYCOLAX) 17 g packet Take 17 g by mouth daily.     [provider]  senna-docusate (SENOKOT-S) 8.6-50 MG tablet Take 1 tablet by mouth at bedtime. Patient taking differently: Take 1 tablet by mouth as needed.  03/06/20   Murlean Iba, MD    Allergies    Cozaar [losartan]  Review of Systems   Review of Systems  All other systems reviewed and are negative.   Physical Exam Updated Vital Signs BP (!) 130/55   Pulse 65   Temp 98.7 F (37.1 C) (Oral)   Resp (!) 22   Ht 1.651 m (5\' 5" )   Wt 58.5 kg   SpO2 99%   BMI 21.47 kg/m   Physical Exam Vitals and nursing note reviewed.  Constitutional:      General: She is not in acute distress.    Appearance: She is well-developed.  HENT:     Head: Normocephalic and atraumatic.     Mouth/Throat:     Pharynx: No oropharyngeal exudate.  Eyes:     General: No scleral icterus.       Right eye: No discharge.        Left eye: No discharge.     Conjunctiva/sclera: Conjunctivae normal.     Pupils: Pupils are equal, round, and reactive to light.  Neck:     Thyroid: No thyromegaly.     Vascular: No JVD.  Cardiovascular:     Rate and Rhythm: Regular rhythm. Tachycardia present.     Heart sounds: Normal heart sounds. No murmur heard.  No friction rub. No gallop.   Pulmonary:     Effort: Pulmonary effort is normal. No respiratory distress.     Breath sounds: Normal breath sounds. No wheezing or rales.  Abdominal:     General: Bowel sounds are normal. There is no distension.     Palpations: Abdomen is soft. There is no mass.     Tenderness: There is no abdominal tenderness.  Musculoskeletal:        General: No tenderness. Normal range of motion.     Cervical back: Normal range of motion and neck supple.  Lymphadenopathy:     Cervical: No  cervical adenopathy.  Skin:    General: Skin is warm and dry.     Findings: No erythema or rash.  Neurological:  Mental Status: She is alert.     Coordination: Coordination normal.  Psychiatric:        Behavior: Behavior normal.     ED Results / Procedures / Treatments   Labs (all labs ordered are listed, but only abnormal results are displayed) Labs Reviewed  CBC WITH DIFFERENTIAL/PLATELET - Abnormal; Notable for the following components:      Result Value   WBC 3.5 (*)    Lymphs Abs 0.6 (*)    All other components within normal limits  COMPREHENSIVE METABOLIC PANEL - Abnormal; Notable for the following components:   Glucose, Bld 118 (*)    GFR, Estimated 53 (*)    All other components within normal limits  BRAIN NATRIURETIC PEPTIDE - Abnormal; Notable for the following components:   B Natriuretic Peptide 203.0 (*)    All other components within normal limits  TROPONIN I (HIGH SENSITIVITY) - Abnormal; Notable for the following components:   Troponin I (High Sensitivity) 23 (*)    All other components within normal limits    EKG EKG Interpretation  Date/Time:  Monday May 26 2020 11:51:51 EDT Ventricular Rate:  139 PR Interval:    QRS Duration: 125 QT Interval:  333 QTC Calculation: 507 R Axis:   -75 Text Interpretation: RBBB and LAFB Inferior infarct, old Supraventricular tachycardia Since last tracing rate faster with repol abnormalities. Confirmed by [name] [phone]) on 05/26/2020 12:37:08 PM   EKG Interpretation  Date/Time:  Monday May 26 2020 12:41:55 EDT Ventricular Rate:  80 PR Interval:    QRS Duration: 122 QT Interval:  396 QTC Calculation: 457 R Axis:   -78 Text Interpretation: Sinus rhythm Prolonged PR interval RBBB and LAFB Inferior infarct, old Since last tracing SVT has been replaced with NSR with RBBB pattern Confirmed by [name] [id number]) on 05/26/2020 2:43:47 PM        Radiology DG Chest Port 1 View  Result Date:  05/26/2020 CLINICAL DATA:  Shortness of breath EXAM: PORTABLE CHEST 1 VIEW COMPARISON:  03/04/2020 FINDINGS: The heart size and mediastinal contours are stable. Atherosclerotic calcification of the aortic knob. No focal airspace consolidation, pleural effusion, or pneumothorax. The visualized skeletal structures are unremarkable. IMPRESSION: No active disease. Electronically Signed   By: Davina Poke D.O.   On: 05/26/2020 13:06    Procedures Procedures (including critical care time)  Medications Ordered in ED Medications  metoprolol tartrate (LOPRESSOR) injection 5 mg (5 mg Intravenous Given 05/26/20 1233)    ED Course  I have reviewed the triage vital signs and the nursing notes.  Pertinent labs & imaging results that were available during my care of the patient were reviewed by me and considered in my medical decision making (see chart for details).    MDM Rules/Calculators/A&P                          This patient does have a heart rate of about 138, it is extremely irregular and appears to be SVT on the EKG.  Her blood pressure is soft at approximately 54-562 systolic and she is afebrile with a normal oxygen level and clear lungs.  At this time the patient will need consultation with cardiology, will obtain some labs and give her a bit of metoprolol which seemed to work last time.  Labas reassuring - d/w Dr. Harl Bowie - will arrange close follow up. Requests 75 metoprolol bid - pt informed Remains stable with VS and heart rate Son informed -  all parties ina greement  Final Clinical Impression(s) / ED Diagnoses Final diagnoses:  SVT (supraventricular tachycardia) ([place])    Rx / DC Orders ED Discharge Orders         Ordered    Metoprolol Tartrate 75 MG TABS  2 times daily        05/26/20 1536           [name], MD 05/26/20 1537

## 2020-05-26 NOTE — Discharge Instructions (Signed)
I spoke with Dr. Harl Bowie, he is the on-call cardiologist, he recommends that you follow-up very closely with Dr. Tamala Julian who is going to see you in February.  He will make that follow-up happen much sooner.  He also recommends that you increase the dose of metoprolol to 75 mg twice a day instead of 50 mg.  Please return to the emergency department for severe worsening symptoms.

## 2020-05-26 NOTE — ED Triage Notes (Signed)
Pt brought to ED via [facility] for tachycardia. EMS called out to pt for nausea, found HR to be 150's. Pt denies chest pain.

## 2020-05-26 NOTE — Progress Notes (Signed)
Patient with known history of PSVT, presented with SVT that resolved with IV lorpessor now back in SR. Has been on lopressor 50mg  bid. Careful dosing of lopressor as patient with baseline trifasciuclar block, though no high grade block or brady on prior heart monitor. Would increase lopressor to 75mg  bid, if fails or does have issues with bradycardia then will be more clear perhaps a pacemaker is indicated in this 86 yo vs trial of amiodarone. Increase lopressor to 75mg  bid, we will get earlier f/u with Dr Tamala Julian   Carlyle Dolly MD

## 2020-05-29 ENCOUNTER — Ambulatory Visit: Payer: Medicare Other | Admitting: Nurse Practitioner

## 2020-05-29 ENCOUNTER — Ambulatory Visit: Payer: Medicare Other | Admitting: Family Medicine

## 2020-06-05 NOTE — Progress Notes (Deleted)
Cardiology Office Note:    Date:  06/05/2020   ID:  [name], DOB 11-Oct-1919, MRN [id number]  PCP:  Loman Brooklyn, FNP  Cardiologist:  Sinclair Grooms, MD   Referring MD: Loman Brooklyn, FNP   No chief complaint on file.   History of Present Illness:    [name] is a 86 y.o. female with a hx of HTN, HLD,Stage 3 CKD,PSVT and breast cancer (s/p lumpectomy)was hospitalized, recently after she woke on ground.  ***  Past Medical History:  Diagnosis Date  . Arthralgia   . Breast cancer ([place])    left breast   . Constipation   . Dyspepsia   . GERD (gastroesophageal reflux disease)   . High cholesterol   . Hypertension   . NSVT (nonsustained ventricular tachycardia) (HCC)    Short run of monomorphic VT in setting of hypokalemia during 09/2011 hospitalization. EF 60-65% by echo 09/26/11  . Right bundle branch block   . SVT (supraventricular tachycardia) ([place])    Noted 09/2011 responsive to cardizem  . Vertigo     Past Surgical History:  Procedure Laterality Date  . BREAST LUMPECTOMY     left breast cancer years ago  . CHOLECYSTECTOMY    . EYE SURGERY     1- eye - cataract removal   . TONSILLECTOMY      Current Medications: No outpatient medications have been marked as taking for the 06/06/20 encounter (Appointment) with Belva Crome, MD.     Allergies:   Cozaar [losartan]   Social History   Socioeconomic History  . Marital status: Widowed    Spouse name: Not on file  . Number of children: 4  . Years of education: Not on file  . Highest education level: Not on file  Occupational History  . Not on file  Tobacco Use  . Smoking status: Never Smoker  . Smokeless tobacco: Never Used  Vaping Use  . Vaping Use: Never used  Substance and Sexual Activity  . Alcohol use: No  . Drug use: No  . Sexual activity: Not Currently    Birth control/protection: Post-menopausal  Other Topics Concern  . Not on file  Social History Narrative   5  children - 1 passed away,    Lives alone - has alert   Social Determinants of Health   Financial Resource Strain:   . Difficulty of Paying Living Expenses: Not on file  Food Insecurity:   . Worried About Charity fundraiser in the Last Year: Not on file  . Ran Out of Food in the Last Year: Not on file  Transportation Needs:   . Lack of Transportation (Medical): Not on file  . Lack of Transportation (Non-Medical): Not on file  Physical Activity:   . Days of Exercise per Week: Not on file  . Minutes of Exercise per Session: Not on file  Stress:   . Feeling of Stress : Not on file  Social Connections:   . Frequency of Communication with Friends and Family: Not on file  . Frequency of Social Gatherings with Friends and Family: Not on file  . Attends Religious Services: Not on file  . Active Member of Clubs or Organizations: Not on file  . Attends Archivist Meetings: Not on file  . Marital Status: Not on file     Family History: The patient's family history includes Hypertension in her father and mother; Tuberculosis in her father.  ROS:  Please see the history of present illness.    *** All other systems reviewed and are negative.  EKGs/Labs/Other Studies Reviewed:    The following studies were reviewed today:  14 Day Monitor 03/28/2020: Study Highlights   14 day zio patch  Min HR 54, Max HR 207. Avg HR 79  Occasional supraventricular ectopy in the form of PACs, couplets, triplets  Rare ventricular ectopy in the form of isolateted PVCs, couplets, triples  No symptoms reported  Frequent runs of SVT, longest lasting 37 minutes.  Longest episode 4 hours at 155 bpm  ECHOCARDIOGRAPHY 03/05/2020: IMPRESSIONS    1. Left ventricular ejection fraction, by estimation, is 60 to 65%. The  left ventricle has normal function. The left ventricle has no regional  wall motion abnormalities. There is mild left ventricular hypertrophy.  Left ventricular diastolic  parameters  are consistent with Grade I diastolic dysfunction (impaired relaxation).  Elevated left atrial pressure.  2. Right ventricular systolic function is normal. The right ventricular  size is normal. There is severely elevated pulmonary artery systolic  pressure.  3. The mitral valve is normal in structure. Trivial mitral valve  regurgitation. No evidence of mitral stenosis.  4. The aortic valve is tricuspid. Aortic valve regurgitation is not  visualized. No aortic stenosis is present.  5. The inferior vena cava is normal in size with greater than 50%  respiratory variability, suggesting right atrial pressure of 3 mmHg.   EKG:  EKG ***  Recent Labs: 03/04/2020: TSH 1.567 03/06/2020: Magnesium 1.9 05/26/2020: ALT 12; B Natriuretic Peptide 203.0; BUN 17; Creatinine, Ser 0.96; Hemoglobin 12.0; Platelets 160; Potassium 4.0; Sodium 135  Recent Lipid Panel    Component Value Date/Time   CHOL 158 11/20/2019 1051   TRIG 48 11/20/2019 1051   HDL 58 11/20/2019 1051   CHOLHDL 2.7 11/20/2019 1051   LDLCALC 90 11/20/2019 1051    Physical Exam:    VS:  There were no vitals taken for this visit.    Wt Readings from Last 3 Encounters:  05/26/20 129 lb (58.5 kg)  05/16/20 129 lb (58.5 kg)  03/14/20 134 lb (60.8 kg)     GEN: ***. No acute distress HEENT: Normal NECK: No JVD. LYMPHATICS: No lymphadenopathy CARDIAC: *** RRR without murmur, gallop, or edema. VASCULAR: *** Normal Pulses. No bruits. RESPIRATORY:  Clear to auscultation without rales, wheezing or rhonchi  ABDOMEN: Soft, non-tender, non-distended, No pulsatile mass, MUSCULOSKELETAL: No deformity  SKIN: Warm and dry NEUROLOGIC:  Alert and oriented x 3 PSYCHIATRIC:  Normal affect   ASSESSMENT:    1. PSVT (paroxysmal supraventricular tachycardia) ([place])   2. Right bundle branch block   3. Essential hypertension, benign   4. Syncope and collapse   5. Stage 3a chronic kidney disease ([place])   6. Mixed hyperlipidemia     7. Educated about COVID-19 virus infection    PLAN:    In order of problems listed above:  1. ***   Medication Adjustments/Labs and Tests Ordered: Current medicines are reviewed at length with the patient today.  Concerns regarding medicines are outlined above.  No orders of the defined types were placed in this encounter.  No orders of the defined types were placed in this encounter.   There are no Patient Instructions on file for this visit.   Signed, Sinclair Grooms, MD  06/05/2020 5:52 PM    [place]

## 2020-06-06 ENCOUNTER — Ambulatory Visit: Payer: Medicare Other | Admitting: Interventional Cardiology

## 2020-06-06 DIAGNOSIS — I10 Essential (primary) hypertension: Secondary | ICD-10-CM

## 2020-06-06 DIAGNOSIS — I4510 Unspecified right bundle-branch block: Secondary | ICD-10-CM

## 2020-06-06 DIAGNOSIS — I471 Supraventricular tachycardia: Secondary | ICD-10-CM

## 2020-06-06 DIAGNOSIS — Z7189 Other specified counseling: Secondary | ICD-10-CM

## 2020-06-06 DIAGNOSIS — R55 Syncope and collapse: Secondary | ICD-10-CM

## 2020-06-06 DIAGNOSIS — E782 Mixed hyperlipidemia: Secondary | ICD-10-CM

## 2020-06-06 DIAGNOSIS — N1831 Chronic kidney disease, stage 3a: Secondary | ICD-10-CM

## 2020-06-10 ENCOUNTER — Ambulatory Visit: Payer: Medicare Other | Admitting: Nurse Practitioner

## 2020-06-23 ENCOUNTER — Encounter: Payer: Self-pay | Admitting: Nurse Practitioner

## 2020-06-23 ENCOUNTER — Ambulatory Visit (INDEPENDENT_AMBULATORY_CARE_PROVIDER_SITE_OTHER): Payer: Medicare Other | Admitting: Nurse Practitioner

## 2020-06-23 ENCOUNTER — Other Ambulatory Visit: Payer: Self-pay

## 2020-06-23 VITALS — BP 153/72 | HR 68 | Temp 97.2°F | Ht 65.0 in | Wt 131.0 lb

## 2020-06-23 DIAGNOSIS — Z23 Encounter for immunization: Secondary | ICD-10-CM | POA: Insufficient documentation

## 2020-06-23 DIAGNOSIS — I10 Essential (primary) hypertension: Secondary | ICD-10-CM | POA: Diagnosis not present

## 2020-06-23 NOTE — Progress Notes (Signed)
Established Patient Office Visit  Subjective:  Patient ID: [name], female    DOB: 24-Apr-1920  Age: 86 y.o. MRN: [id number]  CC:  Chief Complaint  Patient presents with  . Medical Management of Chronic Issues    HPI [name] is a 86 year old female who presents to clinic for follow-up hypertension.Patient was diagnosed in 09/04/2011. The patient is tolerating the medication well without side effects. Compliance with treatment has been good; including taking medication as directed , maintains a healthy diet , and following up as directed.  Current medication amlodipine 10 mg tablet by mouth daily.  Past Medical History:  Diagnosis Date  . Arthralgia   . Breast cancer ([place])    left breast   . Constipation   . Dyspepsia   . GERD (gastroesophageal reflux disease)   . High cholesterol   . Hypertension   . NSVT (nonsustained ventricular tachycardia) (HCC)    Short run of monomorphic VT in setting of hypokalemia during 09/2011 hospitalization. EF 60-65% by echo 09/26/11  . Right bundle branch block   . SVT (supraventricular tachycardia) ([place])    Noted 09/2011 responsive to cardizem  . Vertigo     Past Surgical History:  Procedure Laterality Date  . BREAST LUMPECTOMY     left breast cancer years ago  . CHOLECYSTECTOMY    . EYE SURGERY     1- eye - cataract removal   . TONSILLECTOMY      Family History  Problem Relation Age of Onset  . Hypertension Mother   . Tuberculosis Father   . Hypertension Father     Social History   Socioeconomic History  . Marital status: Widowed    Spouse name: Not on file  . Number of children: 4  . Years of education: Not on file  . Highest education level: Not on file  Occupational History  . Not on file  Tobacco Use  . Smoking status: Never Smoker  . Smokeless tobacco: Never Used  Vaping Use  . Vaping Use: Never used  Substance and Sexual Activity  . Alcohol use: No  . Drug use: No  . Sexual activity: Not  Currently    Birth control/protection: Post-menopausal  Other Topics Concern  . Not on file  Social History Narrative   5 children - 1 passed away,    Lives alone - has alert   Social Determinants of Health   Financial Resource Strain:   . Difficulty of Paying Living Expenses: Not on file  Food Insecurity:   . Worried About Charity fundraiser in the Last Year: Not on file  . Ran Out of Food in the Last Year: Not on file  Transportation Needs:   . Lack of Transportation (Medical): Not on file  . Lack of Transportation (Non-Medical): Not on file  Physical Activity:   . Days of Exercise per Week: Not on file  . Minutes of Exercise per Session: Not on file  Stress:   . Feeling of Stress : Not on file  Social Connections:   . Frequency of Communication with Friends and Family: Not on file  . Frequency of Social Gatherings with Friends and Family: Not on file  . Attends Religious Services: Not on file  . Active Member of Clubs or Organizations: Not on file  . Attends Archivist Meetings: Not on file  . Marital Status: Not on file  Intimate Partner Violence:   . Fear of Current or Ex-Partner: Not on  file  . Emotionally Abused: Not on file  . Physically Abused: Not on file  . Sexually Abused: Not on file    Outpatient Medications Prior to Visit  Medication Sig Dispense Refill  . amLODipine (NORVASC) 10 MG tablet Take 1 tablet (10 mg total) by mouth daily. 90 tablet 3  . aspirin EC 81 MG tablet Take 81 mg by mouth daily.    Marland Kitchen esomeprazole (NEXIUM) 40 MG capsule TAKE 1 CAPSULE BY MOUTH EVERY DAY 90 capsule 0  . fluticasone (FLONASE) 50 MCG/ACT nasal spray INSTILL 2 SPRAYS IN EACH NOSTRIL ONCE A DAY (Patient taking differently: as needed. ) 48 mL 6  . KLOR-CON M20 20 MEQ tablet Take 1 tablet (20 mEq total) by mouth daily. 90 tablet 1  . Metoprolol Tartrate 75 MG TABS Take 75 mg by mouth 2 (two) times daily. 60 tablet 1  . polyethylene glycol (MIRALAX / GLYCOLAX) 17 g  packet Take 17 g by mouth daily.     Marland Kitchen senna-docusate (SENOKOT-S) 8.6-50 MG tablet Take 1 tablet by mouth at bedtime. (Patient taking differently: Take 1 tablet by mouth as needed. ) 30 tablet 1   No facility-administered medications prior to visit.    Allergies  Allergen Reactions  . Cozaar [Losartan] Swelling    Oral swelling     ROS Review of Systems  Neurological: Negative for light-headedness, numbness and headaches.  All other systems reviewed and are negative.     Objective:    Physical Exam Vitals reviewed. Exam conducted with a chaperone present.  Constitutional:      Appearance: Normal appearance.  HENT:     Head: Normocephalic.     Nose: Nose normal.  Eyes:     Conjunctiva/sclera: Conjunctivae normal.  Cardiovascular:     Rate and Rhythm: Normal rate and regular rhythm.  Pulmonary:     Effort: Pulmonary effort is normal.     Breath sounds: Normal breath sounds.  Abdominal:     General: Bowel sounds are normal.  Musculoskeletal:     Cervical back: Normal range of motion and neck supple.  Neurological:     Mental Status: She is alert.     BP (!) 153/72   Pulse 68   Temp (!) 97.2 F (36.2 C) (Temporal)   Ht 5\' 5"  (1.651 m)   Wt 131 lb (59.4 kg)   SpO2 98%   BMI 21.80 kg/m  Wt Readings from Last 3 Encounters:  06/23/20 131 lb (59.4 kg)  05/26/20 129 lb (58.5 kg)  05/16/20 129 lb (58.5 kg)     Health Maintenance Due  Topic Date Due  . INFLUENZA VACCINE  03/02/2020    There are no preventive care reminders to display for this patient.  Lab Results  Component Value Date   TSH 1.567 03/04/2020   Lab Results  Component Value Date   WBC 3.5 (L) 05/26/2020   HGB 12.0 05/26/2020   HCT 39.2 05/26/2020   MCV 87.5 05/26/2020   PLT 160 05/26/2020   Lab Results  Component Value Date   NA 135 05/26/2020   K 4.0 05/26/2020   CO2 25 05/26/2020   GLUCOSE 118 (H) 05/26/2020   BUN 17 05/26/2020   CREATININE 0.96 05/26/2020   BILITOT 1.0  05/26/2020   ALKPHOS 50 05/26/2020   AST 18 05/26/2020   ALT 12 05/26/2020   PROT 7.0 05/26/2020   ALBUMIN 3.7 05/26/2020   CALCIUM 9.4 05/26/2020   ANIONGAP 9 05/26/2020   Lab Results  Component Value Date   CHOL 158 11/20/2019   Lab Results  Component Value Date   HDL 58 11/20/2019   Lab Results  Component Value Date   LDLCALC 90 11/20/2019   Lab Results  Component Value Date   TRIG 48 11/20/2019   Lab Results  Component Value Date   CHOLHDL 2.7 11/20/2019   Lab Results  Component Value Date   HGBA1C 5.9 (H) 03/04/2020      Assessment & Plan:   Problem List Items Addressed This Visit      Cardiovascular and Mediastinum   Essential hypertension, benign    Blood pressure is elevated in clinic today, patient's and daughter reports that blood pressures are better at home and patient has been compliant with blood pressure medication.  Cardiology evaluation last visit blood pressure fair blood pressure medication on current dose with no changes made. Provided education to patient with printed handouts given. Advised patient to continue monitoring blood pressure and follow-up with worsening or any high blood pressure related symptoms.  Follow-up in 3 months         Other   Need for immunization against influenza - Primary   Relevant Orders   Flu Vaccine QUAD High Dose(Fluad) (Completed)       Follow-up: Return in about 3 months (around 09/23/2020).    Ivy Lynn, NP

## 2020-06-23 NOTE — Patient Instructions (Signed)

## 2020-06-23 NOTE — Assessment & Plan Note (Signed)
Blood pressure is elevated in clinic today, patient's and daughter reports that blood pressures are better at home and patient has been compliant with blood pressure medication.  Cardiology evaluation last visit blood pressure fair blood pressure medication on current dose with no changes made. Provided education to patient with printed handouts given. Advised patient to continue monitoring blood pressure and follow-up with worsening or any high blood pressure related symptoms.  Follow-up in 3 months

## 2020-06-25 ENCOUNTER — Other Ambulatory Visit: Payer: Self-pay | Admitting: Family Medicine

## 2020-06-25 DIAGNOSIS — I10 Essential (primary) hypertension: Secondary | ICD-10-CM

## 2020-06-29 ENCOUNTER — Other Ambulatory Visit: Payer: Self-pay | Admitting: Family Medicine

## 2020-07-12 ENCOUNTER — Other Ambulatory Visit: Payer: Self-pay | Admitting: Family Medicine

## 2020-07-12 ENCOUNTER — Other Ambulatory Visit: Payer: Self-pay | Admitting: Family

## 2020-07-12 DIAGNOSIS — K219 Gastro-esophageal reflux disease without esophagitis: Secondary | ICD-10-CM

## 2020-07-15 ENCOUNTER — Other Ambulatory Visit: Payer: Self-pay

## 2020-07-15 ENCOUNTER — Observation Stay (HOSPITAL_COMMUNITY)
Admission: EM | Admit: 2020-07-15 | Discharge: 2020-07-16 | Disposition: A | Discharge status: Home / Self Care | Payer: Medicare Other | Attending: Internal Medicine | Admitting: Internal Medicine

## 2020-07-15 ENCOUNTER — Emergency Department (HOSPITAL_COMMUNITY): Payer: Medicare Other

## 2020-07-15 DIAGNOSIS — D696 Thrombocytopenia, unspecified: Secondary | ICD-10-CM | POA: Diagnosis present

## 2020-07-15 DIAGNOSIS — K5900 Constipation, unspecified: Secondary | ICD-10-CM | POA: Diagnosis present

## 2020-07-15 DIAGNOSIS — Z853 Personal history of malignant neoplasm of breast: Secondary | ICD-10-CM | POA: Diagnosis not present

## 2020-07-15 DIAGNOSIS — N1831 Chronic kidney disease, stage 3a: Secondary | ICD-10-CM | POA: Diagnosis not present

## 2020-07-15 DIAGNOSIS — R109 Unspecified abdominal pain: Secondary | ICD-10-CM

## 2020-07-15 DIAGNOSIS — Z7982 Long term (current) use of aspirin: Secondary | ICD-10-CM | POA: Insufficient documentation

## 2020-07-15 DIAGNOSIS — N939 Abnormal uterine and vaginal bleeding, unspecified: Secondary | ICD-10-CM | POA: Diagnosis not present

## 2020-07-15 DIAGNOSIS — E785 Hyperlipidemia, unspecified: Secondary | ICD-10-CM | POA: Diagnosis present

## 2020-07-15 DIAGNOSIS — R1084 Generalized abdominal pain: Secondary | ICD-10-CM | POA: Diagnosis present

## 2020-07-15 DIAGNOSIS — I129 Hypertensive chronic kidney disease with stage 1 through stage 4 chronic kidney disease, or unspecified chronic kidney disease: Secondary | ICD-10-CM | POA: Diagnosis not present

## 2020-07-15 DIAGNOSIS — Z20822 Contact with and (suspected) exposure to covid-19: Secondary | ICD-10-CM | POA: Diagnosis not present

## 2020-07-15 DIAGNOSIS — E782 Mixed hyperlipidemia: Secondary | ICD-10-CM

## 2020-07-15 DIAGNOSIS — I10 Essential (primary) hypertension: Secondary | ICD-10-CM | POA: Diagnosis present

## 2020-07-15 DIAGNOSIS — Z9181 History of falling: Secondary | ICD-10-CM

## 2020-07-15 DIAGNOSIS — Z79899 Other long term (current) drug therapy: Secondary | ICD-10-CM | POA: Diagnosis not present

## 2020-07-15 DIAGNOSIS — I471 Supraventricular tachycardia: Secondary | ICD-10-CM | POA: Diagnosis not present

## 2020-07-15 DIAGNOSIS — N3941 Urge incontinence: Secondary | ICD-10-CM | POA: Diagnosis present

## 2020-07-15 DIAGNOSIS — R55 Syncope and collapse: Secondary | ICD-10-CM | POA: Diagnosis present

## 2020-07-15 DIAGNOSIS — K219 Gastro-esophageal reflux disease without esophagitis: Secondary | ICD-10-CM

## 2020-07-15 LAB — COMPREHENSIVE METABOLIC PANEL
ALT: 15 U/L (ref 0–44)
AST: 18 U/L (ref 15–41)
Albumin: 4.2 g/dL (ref 3.5–5.0)
Alkaline Phosphatase: 61 U/L (ref 38–126)
Anion gap: 8 (ref 5–15)
BUN: 17 mg/dL (ref 8–23)
CO2: 28 mmol/L (ref 22–32)
Calcium: 9.3 mg/dL (ref 8.9–10.3)
Chloride: 98 mmol/L (ref 98–111)
Creatinine, Ser: 0.9 mg/dL (ref 0.44–1.00)
GFR, Estimated: 58 mL/min — ABNORMAL LOW (ref >60)
Glucose, Bld: 108 mg/dL — ABNORMAL HIGH (ref 70–99)
Potassium: 3.6 mmol/L (ref 3.5–5.1)
Sodium: 134 mmol/L — ABNORMAL LOW (ref 135–145)
Total Bilirubin: 0.9 mg/dL (ref 0.3–1.2)
Total Protein: 8.1 g/dL (ref 6.5–8.1)

## 2020-07-15 LAB — ABO/RH: ABO/RH(D): A POS

## 2020-07-15 LAB — CBC WITH DIFFERENTIAL/PLATELET
Abs Immature Granulocytes: 0.01 10*3/uL (ref 0.00–0.07)
Basophils Absolute: 0 10*3/uL (ref 0.0–0.1)
Basophils Relative: 0 %
Eosinophils Absolute: 0 10*3/uL (ref 0.0–0.5)
Eosinophils Relative: 0 %
HCT: 41.9 % (ref 36.0–46.0)
Hemoglobin: 13 g/dL (ref 12.0–15.0)
Immature Granulocytes: 0 %
Lymphocytes Relative: 15 %
Lymphs Abs: 0.8 10*3/uL (ref 0.7–4.0)
MCH: 27.1 pg (ref 26.0–34.0)
MCHC: 31 g/dL (ref 30.0–36.0)
MCV: 87.5 fL (ref 80.0–100.0)
Monocytes Absolute: 0.4 10*3/uL (ref 0.1–1.0)
Monocytes Relative: 6 %
Neutro Abs: 4.4 10*3/uL (ref 1.7–7.7)
Neutrophils Relative %: 79 %
Platelets: 139 10*3/uL — ABNORMAL LOW (ref 150–400)
RBC: 4.79 MIL/uL (ref 3.87–5.11)
RDW: 16 % — ABNORMAL HIGH (ref 11.5–15.5)
WBC: 5.6 10*3/uL (ref 4.0–10.5)
nRBC: 0 % (ref 0.0–0.2)

## 2020-07-15 LAB — TYPE AND SCREEN
ABO/RH(D): A POS
Antibody Screen: NEGATIVE

## 2020-07-15 LAB — PROTIME-INR
INR: 1.2 (ref 0.8–1.2)
Prothrombin Time: 14.6 seconds (ref 11.4–15.2)

## 2020-07-15 LAB — RESP PANEL BY RT-PCR (FLU A&B, COVID) ARPGX2
Influenza A by PCR: NEGATIVE
Influenza B by PCR: NEGATIVE
SARS Coronavirus 2 by RT PCR: NEGATIVE

## 2020-07-15 LAB — MAGNESIUM: Magnesium: 2.1 mg/dL (ref 1.7–2.4)

## 2020-07-15 MED ORDER — SODIUM CHLORIDE 0.9 % IV SOLN: INTRAVENOUS | Status: DC

## 2020-07-15 MED ORDER — PANTOPRAZOLE SODIUM 40 MG PO TBEC
40.0000 mg | DELAYED_RELEASE_TABLET | Freq: Every day | ORAL | Status: DC
  Administered 2020-07-15 – 2020-07-16 (×2): 40 mg via ORAL
  Filled 2020-07-15 (×2): qty 1

## 2020-07-15 MED ORDER — METOPROLOL TARTRATE 5 MG/5ML IV SOLN
2.5000 mg | Freq: Once | INTRAVENOUS | Status: AC
  Administered 2020-07-15: 10:00:00 2.5 mg via INTRAVENOUS
  Filled 2020-07-15: qty 5

## 2020-07-15 MED ORDER — ACETAMINOPHEN 650 MG RE SUPP: 650.0000 mg | Freq: Four times a day (QID) | RECTAL | Status: DC | PRN

## 2020-07-15 MED ORDER — ACETAMINOPHEN 325 MG PO TABS: 650.0000 mg | ORAL_TABLET | Freq: Four times a day (QID) | ORAL | Status: DC | PRN

## 2020-07-15 MED ORDER — ASPIRIN EC 81 MG PO TBEC
81.0000 mg | DELAYED_RELEASE_TABLET | Freq: Every day | ORAL | Status: DC
  Administered 2020-07-15 – 2020-07-16 (×2): 81 mg via ORAL
  Filled 2020-07-15 (×2): qty 1

## 2020-07-15 MED ORDER — ADENOSINE 6 MG/2ML IV SOLN
6.0000 mg | Freq: Once | INTRAVENOUS | Status: AC
  Administered 2020-07-15: 11:00:00 6 mg via INTRAVENOUS

## 2020-07-15 MED ORDER — METOPROLOL TARTRATE 50 MG PO TABS
75.0000 mg | ORAL_TABLET | Freq: Two times a day (BID) | ORAL | Status: DC
  Administered 2020-07-15 – 2020-07-16 (×3): 75 mg via ORAL
  Filled 2020-07-15 (×3): qty 2

## 2020-07-15 MED ORDER — ONDANSETRON HCL 4 MG PO TABS: 4.0000 mg | ORAL_TABLET | Freq: Four times a day (QID) | ORAL | Status: DC | PRN

## 2020-07-15 MED ORDER — ADENOSINE 6 MG/2ML IV SOLN
INTRAVENOUS | Status: AC
  Administered 2020-07-15: 11:00:00 6 mg via INTRAVENOUS
  Filled 2020-07-15: qty 2

## 2020-07-15 MED ORDER — OXYCODONE HCL 5 MG PO TABS: 2.5000 mg | ORAL_TABLET | ORAL | Status: DC | PRN

## 2020-07-15 MED ORDER — LACTATED RINGERS IV BOLUS
1000.0000 mL | Freq: Once | INTRAVENOUS | Status: AC
  Administered 2020-07-15: 11:00:00 1000 mL via INTRAVENOUS

## 2020-07-15 MED ORDER — POLYETHYLENE GLYCOL 3350 17 G PO PACK
17.0000 g | PACK | Freq: Every day | ORAL | Status: DC
  Administered 2020-07-15 – 2020-07-16 (×2): 17 g via ORAL
  Filled 2020-07-15 (×2): qty 1

## 2020-07-15 MED ORDER — POTASSIUM CHLORIDE CRYS ER 20 MEQ PO TBCR
20.0000 meq | EXTENDED_RELEASE_TABLET | Freq: Every day | ORAL | Status: DC
  Administered 2020-07-15 – 2020-07-16 (×2): 20 meq via ORAL
  Filled 2020-07-15 (×2): qty 1

## 2020-07-15 MED ORDER — ONDANSETRON HCL 4 MG/2ML IJ SOLN: 4.0000 mg | Freq: Four times a day (QID) | INTRAMUSCULAR | Status: DC | PRN

## 2020-07-15 NOTE — ED Notes (Signed)
Pt went to restroom and had a "gush of blood" from her vagina. She bleed maybe 61ml of dark drown blood provider made aware.

## 2020-07-15 NOTE — ED Provider Notes (Signed)
[facility] EMERGENCY DEPARTMENT Provider Note   CSN: [id number] Arrival date & time: 07/15/20  0349     History Chief Complaint  Patient presents with  . Abdominal Pain    [name] is a 86 y.o. female.  HPI Patient presents with feeling bad.  States her abdomen hurts and that she has had blood in the stool.  States it was red blood earlier today.  She is on anticoagulation.  Somewhat difficult to get history from.  Does have some difficulty hearing.  Denies chest pain.  Denies vomiting.  States she just had blood without stool,.  States is, total of 3 times.  Also appears to be in an SVT.  History of same.  Has not had all of her medicines this morning.    Past Medical History:  Diagnosis Date  . Arthralgia   . Breast cancer ([place])    left breast   . Constipation   . Dyspepsia   . GERD (gastroesophageal reflux disease)   . High cholesterol   . Hypertension   . NSVT (nonsustained ventricular tachycardia) (HCC)    Short run of monomorphic VT in setting of hypokalemia during 09/2011 hospitalization. EF 60-65% by echo 09/26/11  . Right bundle branch block   . SVT (supraventricular tachycardia) ([place])    Noted 09/2011 responsive to cardizem  . Vertigo     Patient Active Problem List   Diagnosis Date Noted  . Need for immunization against influenza 06/23/2020  . At high risk for falls 04/04/2020  . Urge incontinence 04/04/2020  . Goals of care, counseling/discussion   . Palliative care by specialist   . SVT (supraventricular tachycardia) ([place]) 03/04/2020  . Syncope and collapse 03/04/2020  . Generalized abdominal pain   . Stage 3a chronic kidney disease ([place]) 02/21/2020  . Constipation 05/04/2019  . Arthralgia of right hip 04/21/2015  . GERD (gastroesophageal reflux disease) 11/26/2014  . Hyperlipidemia 01/01/2014  . PSVT (paroxysmal supraventricular tachycardia) ([place]) 09/26/2011  . Right bundle branch block 09/26/2011  . Essential hypertension, benign  09/26/2011    Past Surgical History:  Procedure Laterality Date  . BREAST LUMPECTOMY     left breast cancer years ago  . CHOLECYSTECTOMY    . EYE SURGERY     1- eye - cataract removal   . TONSILLECTOMY       OB History    Gravida  5   Para  5   Term  5   Preterm      AB      Living  4     SAB      IAB      Ectopic      Multiple      Live Births  5           Family History  Problem Relation Age of Onset  . Hypertension Mother   . Tuberculosis Father   . Hypertension Father     Social History   Tobacco Use  . Smoking status: Never Smoker  . Smokeless tobacco: Never Used  Vaping Use  . Vaping Use: Never used  Substance Use Topics  . Alcohol use: No  . Drug use: No    Home Medications Prior to Admission medications   Medication Sig Start Date End Date Taking? Authorizing Provider  amLODipine (NORVASC) 10 MG tablet TAKE 1 TABLET BY MOUTH EVERY DAY IN THE MORNING 06/25/20  Yes Stacks, Cletus Gash, MD  aspirin EC 81 MG tablet Take 81 mg by  mouth daily.   Yes [provider]  esomeprazole (NEXIUM) 40 MG capsule TAKE 1 CAPSULE BY MOUTH EVERY DAY 07/14/20  Yes Claretta Fraise, MD  fluticasone (FLONASE) 50 MCG/ACT nasal spray INSTILL 2 SPRAYS IN EACH NOSTRIL ONCE A DAY Patient taking differently: as needed. 11/19/19  Yes Hendricks Limes F, FNP  KLOR-CON M20 20 MEQ tablet TAKE 1 TABLET BY MOUTH EVERY DAY 07/14/20  Yes Claretta Fraise, MD  Metoprolol Tartrate 75 MG TABS Take 75 mg by mouth 2 (two) times daily. 05/26/20 06/25/20 Yes [name], MD  polyethylene glycol (MIRALAX / GLYCOLAX) 17 g packet Take 17 g by mouth daily.    Yes [provider]    Allergies    Cozaar [losartan]  Review of Systems   Review of Systems  Constitutional: Positive for fatigue. Negative for appetite change.  HENT: Negative for congestion.   Respiratory: Negative for shortness of breath.   Cardiovascular: Negative for chest pain.  Gastrointestinal: Positive  for abdominal pain and blood in stool.  Genitourinary: Negative for flank pain.  Musculoskeletal: Negative for back pain.  Skin: Negative for wound.  Neurological: Positive for weakness.  Psychiatric/Behavioral: Negative for confusion.    Physical Exam Updated Vital Signs BP (!) 144/73   Pulse (!) 132   Temp 98.1 F (36.7 C) (Oral)   Resp 16   Ht 5\' 5"  (1.651 m)   Wt 58.5 kg   SpO2 95%   BMI 21.47 kg/m   Physical Exam Vitals and nursing note reviewed.  HENT:     Head: Normocephalic.  Cardiovascular:     Rate and Rhythm: Regular rhythm. Tachycardia present.  Abdominal:     Comments: Mild abdominal distention.  Right lower quadrant tenderness without rebound or guarding.  No hernia palpated.  Genitourinary:    Comments: Rectal exam showed brown stool with no blood and was guaiac negative. However there was mild amount of blood on vaginal exam.  Does have pessary in place. Skin:    General: Skin is warm.     Capillary Refill: Capillary refill takes less than 2 seconds.  Neurological:     Mental Status: She is alert.     Comments: Awake and pleasant.  Somewhat hard of hearing.     ED Results / Procedures / Treatments   Labs (all labs ordered are listed, but only abnormal results are displayed) Labs Reviewed  COMPREHENSIVE METABOLIC PANEL - Abnormal; Notable for the following components:      Result Value   Sodium 134 (*)    Glucose, Bld 108 (*)    GFR, Estimated 58 (*)    All other components within normal limits  CBC WITH DIFFERENTIAL/PLATELET - Abnormal; Notable for the following components:   RDW 16.0 (*)    Platelets 139 (*)    All other components within normal limits  RESP PANEL BY RT-PCR (FLU A&B, COVID) ARPGX2  PROTIME-INR  MAGNESIUM  TYPE AND SCREEN    EKG EKG Interpretation  Date/Time:  Tuesday July 15 2020 09:51:44 EST Ventricular Rate:  144 PR Interval:    QRS Duration: 141 QT Interval:  331 QTC Calculation: 513 R Axis:   -93 Text  Interpretation: likely SVT RBBB Baseline wander in lead(s) V6 Confirmed by Davonna Belling [id number]) on 07/15/2020 9:55:09 AM   Radiology DG Abdomen Acute W/Chest  Result Date: 07/15/2020 CLINICAL DATA:  Abdominal pain EXAM: DG ABDOMEN ACUTE WITH 1 VIEW CHEST COMPARISON:  Abdomen 04/20/2015 FINDINGS: Heart size and vascularity normal. Atherosclerotic calcification aortic arch.  Lungs are clear. Nonobstructive bowel gas pattern. No dilated bowel loops. No free air. Surgical clips right upper quadrant. IMPRESSION: Negative abdominal radiographs.  No acute cardiopulmonary disease. Electronically Signed   By: Franchot Gallo M.D.   On: 07/15/2020 10:31    Procedures .Cardioversion  Date/Time: 07/15/2020 11:57 AM Performed by: Davonna Belling, MD Authorized by: Davonna Belling, MD     (including critical care time)  Medications Ordered in ED Medications  metoprolol tartrate (LOPRESSOR) injection 2.5 mg (2.5 mg Intravenous Given 07/15/20 1029)  lactated ringers bolus 1,000 mL (1,000 mLs Intravenous New Bag/Given 07/15/20 1113)  adenosine (ADENOCARD) 6 MG/2ML injection 6 mg (6 mg Intravenous Given 07/15/20 1110)    ED Course  I have reviewed the triage vital signs and the nursing notes.  Pertinent labs & imaging results that were available during my care of the patient were reviewed by me and considered in my medical decision making (see chart for details).    MDM Rules/Calculators/A&P                          Patient presented feeling bad.  Abdominal pain.  Reported rectal bleeding but appears to be vaginal bleeding.  Hemoglobin reassuring.  Lab work reassuring.  However found to be in her paroxysmal SVT.  History of same.  1.25 mg of metoprolol given and had hypotension.  Did however have good conversion back to a sinus rhythm with 6 mg of adenosine.  Patient feels better and blood pressures improved.  However with age comorbidities recurrent SVT hypotension and vaginal  bleeding.  CRITICAL CARE Performed by: Davonna Belling Total critical care time: 30 minutes Critical care time was exclusive of separately billable procedures and treating other patients. Critical care was necessary to treat or prevent imminent or life-threatening deterioration. Critical care was time spent personally by me on the following activities: development of treatment plan with patient and/or surrogate as well as nursing, discussions with consultants, evaluation of patient's response to treatment, examination of patient, obtaining history from patient or surrogate, ordering and performing treatments and interventions, ordering and review of laboratory studies, ordering and review of radiographic studies, pulse oximetry and re-evaluation of patient's condition.  Final Clinical Impression(s) / ED Diagnoses Final diagnoses:  Paroxysmal SVT (supraventricular tachycardia) ([place])  Abdominal pain, unspecified abdominal location  Vaginal bleeding    Rx / DC Orders ED Discharge Orders    None       Davonna Belling, MD 07/15/20 1157

## 2020-07-15 NOTE — ED Triage Notes (Signed)
Pt woke up went to bathroom and had bleeding from her rectum. Describes as bright blood.  Alert and oriented, skin warm and dry.  Hard of hearing

## 2020-07-15 NOTE — ED Notes (Addendum)
Pt ate 50% of supper tray.

## 2020-07-15 NOTE — ED Notes (Signed)
Walked with pt to bathroom, gait steady with cane. Pt has small amount of bright red blood on adult diaper. Adult diaper changed. Walked with pt back to room and reapplied cardiac monitor and monitoring equipment. NAD, NAE.

## 2020-07-15 NOTE — H&P (Signed)
History and Physical  Miller KGU:[id number] DOB: February 14, 1920 DOA: 07/15/2020  PCP: Loman Brooklyn, FNP  Patient coming from: Home   I have personally briefly reviewed patient's old medical records in [place]  Chief Complaint: Abdominal pain   HPI: [name] is a 86 y.o. female with medical history significant for paroxysmal SVT, left breast cancer, dyspepsia, chronic constipation, hypertension, hyper hypercholesterolemia, RBBB, arthralgias who is living home with family who reports that she woke up this morning and that she did not feel well.  She reports she had some dyspepsia and generalized abdominal pain and discomfort. She denied having chest pain.  She denied palpitations.  She reported that she had some blood in her stool.  She denied having nausea and vomiting.  She denied diarrhea but reports that she has chronic constipation and she did not have a bowel movement today.  She is taking MiraLAX on a daily basis.  When she was worked up in the ED she was found to be in SVT requiring several doses of adenosine and metoprolol but now is in a sinus rhythm.  She reports that she did not take her morning medications today.  She was noted to be guaiac stool negative.  There was some note of blood coming from the vagina.  She was asked to be admitted for observation given her advanced age and recurrent SVT that required treatment.  Her labs are stable and abdominal xray was stable with no indication of acute findings.    Review of Systems: Review of Systems  Constitutional: Negative for chills, fever, malaise/fatigue and weight loss.  HENT: Negative for sore throat and tinnitus.   Eyes: Negative.   Respiratory: Negative.   Cardiovascular: Negative for chest pain and palpitations.  Gastrointestinal: Positive for abdominal pain and constipation. Negative for diarrhea, nausea and vomiting.  Genitourinary: Negative.   Musculoskeletal: Negative.    Skin: Negative for rash.  Neurological: Negative.   Endo/Heme/Allergies: Negative.   Psychiatric/Behavioral: Negative.   All other systems reviewed and are negative.   Past Medical History:  Diagnosis Date  . Arthralgia   . Breast cancer ([place])    left breast   . Constipation   . Dyspepsia   . GERD (gastroesophageal reflux disease)   . High cholesterol   . Hypertension   . NSVT (nonsustained ventricular tachycardia) (HCC)    Short run of monomorphic VT in setting of hypokalemia during 09/2011 hospitalization. EF 60-65% by echo 09/26/11  . Right bundle branch block   . SVT (supraventricular tachycardia) ([place])    Noted 09/2011 responsive to cardizem  . Vertigo     Past Surgical History:  Procedure Laterality Date  . BREAST LUMPECTOMY     left breast cancer years ago  . CHOLECYSTECTOMY    . EYE SURGERY     1- eye - cataract removal   . TONSILLECTOMY       reports that she has never smoked. She has never used smokeless tobacco. She reports that she does not drink alcohol and does not use drugs.  Allergies  Allergen Reactions  . Cozaar [Losartan] Swelling    Oral swelling     Family History  Problem Relation Age of Onset  . Hypertension Mother   . Tuberculosis Father   . Hypertension Father     Prior to Admission medications   Medication Sig Start Date End Date Taking? Authorizing Provider  amLODipine (NORVASC) 10 MG tablet TAKE 1 TABLET BY  MOUTH EVERY DAY IN THE MORNING 06/25/20  Yes Stacks, Cletus Gash, MD  aspirin EC 81 MG tablet Take 81 mg by mouth daily.   Yes [provider]  esomeprazole (NEXIUM) 40 MG capsule TAKE 1 CAPSULE BY MOUTH EVERY DAY 07/14/20  Yes Claretta Fraise, MD  fluticasone (FLONASE) 50 MCG/ACT nasal spray INSTILL 2 SPRAYS IN EACH NOSTRIL ONCE A DAY Patient taking differently: as needed. 11/19/19  Yes Hendricks Limes F, FNP  KLOR-CON M20 20 MEQ tablet TAKE 1 TABLET BY MOUTH EVERY DAY 07/14/20  Yes Claretta Fraise, MD  Metoprolol Tartrate 75 MG  TABS Take 75 mg by mouth 2 (two) times daily. 05/26/20 06/25/20 Yes [name], MD  polyethylene glycol (MIRALAX / GLYCOLAX) 17 g packet Take 17 g by mouth daily.    Yes [provider]     Physical Exam: Vitals:   07/15/20 1115 07/15/20 1130 07/15/20 1145 07/15/20 1200  BP:  (!) 144/73  (!) 152/74  Pulse:   85 85  Resp: 18 16 (!) 21 19  Temp:      TempSrc:      SpO2:   97% 96%  Weight:      Height:       Constitutional: elderly female awake, alert, NAD, calm, comfortable Eyes: PERRL, lids and conjunctivae normal ENMT: Mucous membranes are moist. Posterior pharynx clear of any exudate or lesions.Normal dentition.  Neck: normal, supple, no masses, no thyromegaly Respiratory: clear to auscultation bilaterally, no wheezing, no crackles. Normal respiratory effort. No accessory muscle use.  Cardiovascular: normal s1, s2 sounds, no murmurs / rubs / gallops. No extremity edema. 2+ pedal pulses. No carotid bruits.  Abdomen: no tenderness, no masses palpated. No hepatosplenomegaly. Bowel sounds positive.  Musculoskeletal: no clubbing / cyanosis. No joint deformity upper and lower extremities. Good ROM, no contractures. Normal muscle tone.  Skin: no rashes, lesions, ulcers. No induration Neurologic: CN 2-12 grossly intact. Sensation intact, DTR normal. Strength 5/5 in all 4.  Psychiatric: Normal judgment and insight. Alert and oriented x 3. Normal mood.   Labs on Admission: I have personally reviewed following labs and imaging studies  CBC: Recent Labs  Lab 07/15/20 1016  WBC 5.6  NEUTROABS 4.4  HGB 13.0  HCT 41.9  MCV 87.5  PLT 361*   Basic Metabolic Panel: Recent Labs  Lab 07/15/20 1016  NA 134*  K 3.6  CL 98  CO2 28  GLUCOSE 108*  BUN 17  CREATININE 0.90  CALCIUM 9.3  MG 2.1   GFR: Estimated Creatinine Clearance: 31.4 mL/min (by C-G formula based on SCr of 0.9 mg/dL). Liver Function Tests: Recent Labs  Lab 07/15/20 1016  AST 18  ALT 15  ALKPHOS 61   BILITOT 0.9  PROT 8.1  ALBUMIN 4.2   No results for input(s): LIPASE, AMYLASE in the last 168 hours. No results for input(s): AMMONIA in the last 168 hours. Coagulation Profile: Recent Labs  Lab 07/15/20 1016  INR 1.2   Cardiac Enzymes: No results for input(s): CKTOTAL, CKMB, CKMBINDEX, TROPONINI in the last 168 hours. BNP (last 3 results) No results for input(s): PROBNP in the last 8760 hours. HbA1C: No results for input(s): HGBA1C in the last 72 hours. CBG: No results for input(s): GLUCAP in the last 168 hours. Lipid Profile: No results for input(s): CHOL, HDL, LDLCALC, TRIG, CHOLHDL, LDLDIRECT in the last 72 hours. Thyroid Function Tests: No results for input(s): TSH, T4TOTAL, FREET4, T3FREE, THYROIDAB in the last 72 hours. Anemia Panel: No results for input(s):  VITAMINB12, FOLATE, FERRITIN, TIBC, IRON, RETICCTPCT in the last 72 hours. Urine analysis:    Component Value Date/Time   COLORURINE YELLOW 03/04/2020 1354   APPEARANCEUR CLEAR 03/04/2020 1354   LABSPEC >1.046 (H) 03/04/2020 1354   PHURINE 6.0 03/04/2020 1354   GLUCOSEU NEGATIVE 03/04/2020 1354   HGBUR NEGATIVE 03/04/2020 1354   BILIRUBINUR NEGATIVE 03/04/2020 1354   KETONESUR 5 (A) 03/04/2020 1354   PROTEINUR 30 (A) 03/04/2020 1354   UROBILINOGEN 0.2 04/20/2015 1445   NITRITE NEGATIVE 03/04/2020 1354   LEUKOCYTESUR NEGATIVE 03/04/2020 1354    Radiological Exams on Admission: DG Abdomen Acute W/Chest  Result Date: 07/15/2020 CLINICAL DATA:  Abdominal pain EXAM: DG ABDOMEN ACUTE WITH 1 VIEW CHEST COMPARISON:  Abdomen 04/20/2015 FINDINGS: Heart size and vascularity normal. Atherosclerotic calcification aortic arch. Lungs are clear. Nonobstructive bowel gas pattern. No dilated bowel loops. No free air. Surgical clips right upper quadrant. IMPRESSION: Negative abdominal radiographs.  No acute cardiopulmonary disease. Electronically Signed   By: Franchot Gallo M.D.   On: 07/15/2020 10:31    Assessment/Plan Principal Problem:   Paroxysmal SVT (supraventricular tachycardia) (HCC) Active Problems:   Essential hypertension, benign   GERD (gastroesophageal reflux disease)   Hyperlipidemia   Constipation   Stage 3a chronic kidney disease (HCC)   Syncope and collapse   Generalized abdominal pain   At high risk for falls   Urge incontinence   Vaginal bleeding   Thrombocytopenia ([place])    1. Paroxysmal SVT-patient had significant SVT requiring 2 doses of IV adenosine and metoprolol but now has has returned to a sinus rhythm and heart rate is controlled.  I have resumed her regular home metoprolol 75 mg twice daily and will admit to telemetry for further observation.  Continue to monitor.. 2. Essential hypertension-holding amlodipine temporarily due to soft blood pressure to allow me to resume it the metoprolol 75 mg twice daily.  Follow.   3. Vaginal bleeding-mild-outpatient follow-up with GYN or PCP. 4. Generalized abdominal pain-secondary to constipation resume home laxative therapy. 5. GERD-Protonix ordered for GI protection. 6. Stage IIIa CKD.-Stable. 7. Thrombocytopenia-stable.  DVT prophylaxis: SCD Code Status: Full Family Communication: Daughter at bedside Disposition Plan: Home Consults called: N/A  Irwin Brakeman MD [facility] How to contact the [facility] Attending or Consulting provider [place] or covering provider during after hours [place], for this patient?  1. Check the care team in [facility] and look for a) attending/consulting [facility] provider listed and b) the [facility] team listed 2. Log into [email or web address] and use [place]'s universal password to access. If you do not have the password, please contact the hospital operator. 3. Locate the [facility] provider you are looking for under [facility] and page to a number that you can be directly reached. 4. If you still have difficulty reaching the provider, please page the [facility] (Director on Call) for the Hospitalists  listed on amion for assistance.  If 7PM-7AM, please contact night-coverage [email or web address] Password [facility]  07/15/2020, 12:29 PM

## 2020-07-16 DIAGNOSIS — I471 Supraventricular tachycardia: Secondary | ICD-10-CM

## 2020-07-16 DIAGNOSIS — N939 Abnormal uterine and vaginal bleeding, unspecified: Secondary | ICD-10-CM | POA: Diagnosis not present

## 2020-07-16 LAB — CBC WITH DIFFERENTIAL/PLATELET
Abs Immature Granulocytes: 0.01 10*3/uL (ref 0.00–0.07)
Basophils Absolute: 0 10*3/uL (ref 0.0–0.1)
Basophils Relative: 0 %
Eosinophils Absolute: 0 10*3/uL (ref 0.0–0.5)
Eosinophils Relative: 0 %
HCT: 34.7 % — ABNORMAL LOW (ref 36.0–46.0)
Hemoglobin: 10.7 g/dL — ABNORMAL LOW (ref 12.0–15.0)
Immature Granulocytes: 0 %
Lymphocytes Relative: 17 %
Lymphs Abs: 1 10*3/uL (ref 0.7–4.0)
MCH: 26.8 pg (ref 26.0–34.0)
MCHC: 30.8 g/dL (ref 30.0–36.0)
MCV: 86.8 fL (ref 80.0–100.0)
Monocytes Absolute: 0.7 10*3/uL (ref 0.1–1.0)
Monocytes Relative: 11 %
Neutro Abs: 4 10*3/uL (ref 1.7–7.7)
Neutrophils Relative %: 72 %
Platelets: 125 10*3/uL — ABNORMAL LOW (ref 150–400)
RBC: 4 MIL/uL (ref 3.87–5.11)
RDW: 15.7 % — ABNORMAL HIGH (ref 11.5–15.5)
WBC: 5.7 10*3/uL (ref 4.0–10.5)
nRBC: 0 % (ref 0.0–0.2)

## 2020-07-16 LAB — COMPREHENSIVE METABOLIC PANEL
ALT: 11 U/L (ref 0–44)
AST: 14 U/L — ABNORMAL LOW (ref 15–41)
Albumin: 3.3 g/dL — ABNORMAL LOW (ref 3.5–5.0)
Alkaline Phosphatase: 47 U/L (ref 38–126)
Anion gap: 8 (ref 5–15)
BUN: 15 mg/dL (ref 8–23)
CO2: 28 mmol/L (ref 22–32)
Calcium: 8.5 mg/dL — ABNORMAL LOW (ref 8.9–10.3)
Chloride: 100 mmol/L (ref 98–111)
Creatinine, Ser: 0.64 mg/dL (ref 0.44–1.00)
GFR, Estimated: >60 mL/min (ref >60)
Glucose, Bld: 82 mg/dL (ref 70–99)
Potassium: 3.5 mmol/L (ref 3.5–5.1)
Sodium: 136 mmol/L (ref 135–145)
Total Bilirubin: 1 mg/dL (ref 0.3–1.2)
Total Protein: 6.4 g/dL — ABNORMAL LOW (ref 6.5–8.1)

## 2020-07-16 LAB — MAGNESIUM: Magnesium: 1.9 mg/dL (ref 1.7–2.4)

## 2020-07-16 MED ORDER — POTASSIUM CHLORIDE CRYS ER 20 MEQ PO TBCR
40.0000 meq | EXTENDED_RELEASE_TABLET | Freq: Once | ORAL | Status: AC
  Administered 2020-07-16: 10:00:00 40 meq via ORAL
  Filled 2020-07-16: qty 2

## 2020-07-16 MED ORDER — AMIODARONE HCL 200 MG PO TABS: 200.0000 mg | ORAL_TABLET | Freq: Two times a day (BID) | ORAL | Status: DC

## 2020-07-16 NOTE — Care Management Obs Status (Signed)
[place] NOTIFICATION   Patient Details  Name: [name] MRN: [id number] Date of Birth: 06/20/1920   Medicare Observation Status Notification Given:  Yes    Salome Arnt, LCSW 07/16/2020, 10:20 AM

## 2020-07-16 NOTE — ED Notes (Signed)
Pt ambulatory to bathroom with cane, gait steady. Assisted back to bed.

## 2020-07-16 NOTE — Care Management CC44 (Signed)
Condition Code 44 Documentation Completed  Patient Details  Name: [name] MRN: [id number] Date of Birth: Aug 09, 1919   Condition Code 44 given:  Yes Patient signature on Condition Code 44 notice:  Yes (verbal) Documentation of 2 MD's agreement:  Yes Code 44 added to claim:  Yes    Salome Arnt, [place] 07/16/2020, 10:20 AM

## 2020-07-16 NOTE — Discharge Summary (Signed)
Physician Discharge Summary  [name] QMG:[id number] DOB: 1919-11-08 DOA: 07/15/2020  PCP: Loman Brooklyn, FNP  Admit date: 07/15/2020  Discharge date: 07/16/2020  Admitted From:Home  Disposition:  Home  Recommendations for Outpatient Follow-up:  1. Follow up with PCP in 1-2 weeks 2. Follow-up with gynecology Dr. Elonda Husky on 12/17 as previously scheduled to follow-up vaginal bleed 3. Follow-up with Dr. Daneen Schick for further evaluation of SVT which will be scheduled.  Continue metoprolol dosing as previously prescribed as discussed with cardiology. 4. Continue on other home medications as prior  Home Health: None  Equipment/Devices: None  Discharge Condition: Stable  CODE STATUS: Full  Diet recommendation: Dysphagia 3  Brief/Interim Summary: Per HPI: [name] is a 86 y.o. female with medical history significant for paroxysmal SVT, left breast cancer, dyspepsia, chronic constipation, hypertension, hyper hypercholesterolemia, RBBB, arthralgias who is living home with family who reports that she woke up this morning and that she did not feel well.  She reports she had some dyspepsia and generalized abdominal pain and discomfort. She denied having chest pain.  She denied palpitations.  She reported that she had some blood in her stool.  She denied having nausea and vomiting.  She denied diarrhea but reports that she has chronic constipation and she did not have a bowel movement today.  She is taking MiraLAX on a daily basis.  When she was worked up in the ED she was found to be in SVT requiring several doses of adenosine and metoprolol but now is in a sinus rhythm.  She reports that she did not take her morning medications today.  She was noted to be guaiac stool negative.  There was some note of blood coming from the vagina.  She was asked to be admitted for observation given her advanced age and recurrent SVT that required treatment.  Her labs are stable and abdominal xray  was stable with no indication of acute findings.    -Patient was admitted with symptoms of abdominal pain and some vaginal bleeding as well as paroxysmal SVT.  She had required 2 doses of adenosine as well as IV metoprolol with return of heart rate to sinus rhythm.  Her heart rate this morning has periods of atrial fibrillation noted, but is well controlled at 70-80 bpm with stable vital signs.  She has vaginal bleeding that has now diminished significantly and is scant.  This is likely related to the pessary as per discussion with her gynecologist Dr. Elonda Husky who will follow up with the patient in outpatient setting on 12/17.  She has no further abdominal pain and is tolerating her diet.  Discussed case with cardiology Dr. Harl Bowie as well who recommends following up with her outpatient cardiologist and remaining on her current metoprolol dose at this time.  No other acute events noted this admission and patient is overall stable for discharge.  Discharge Diagnoses:  Principal Problem:   Paroxysmal SVT (supraventricular tachycardia) (HCC) Active Problems:   Essential hypertension, benign   GERD (gastroesophageal reflux disease)   Hyperlipidemia   Constipation   Stage 3a chronic kidney disease (HCC)   Syncope and collapse   Generalized abdominal pain   At high risk for falls   Urge incontinence   Vaginal bleeding   Thrombocytopenia [facility])    Discharge Instructions  Discharge Instructions    Diet - low sodium heart healthy   Complete by: As directed    Increase activity slowly   Complete by: As directed  Allergies as of 07/16/2020      Reactions   Cozaar [losartan] Swelling   Oral swelling       Medication List    TAKE these medications   amLODipine 10 MG tablet Commonly known as: NORVASC TAKE 1 TABLET BY MOUTH EVERY DAY IN THE MORNING   aspirin EC 81 MG tablet Take 81 mg by mouth daily.   esomeprazole 40 MG capsule Commonly known as: NEXIUM TAKE 1 CAPSULE BY MOUTH EVERY  DAY   fluticasone 50 MCG/ACT nasal spray Commonly known as: FLONASE INSTILL 2 SPRAYS IN EACH NOSTRIL ONCE A DAY What changed: See the new instructions.   Klor-Con M20 20 MEQ tablet Generic drug: potassium chloride SA TAKE 1 TABLET BY MOUTH EVERY DAY   Metoprolol Tartrate 75 MG Tabs Take 75 mg by mouth 2 (two) times daily.   polyethylene glycol 17 g packet Commonly known as: MIRALAX / GLYCOLAX Take 17 g by mouth daily.       Follow-up Information    Loman Brooklyn, FNP Follow up in 1 week(s).   Specialty: Family Medicine Contact information: [place] [address] [phone]        Belva Crome, MD .   Specialty: Cardiology Contact information: [phone] [place] [address] [phone]        Florian Buff, MD Follow up on 07/18/2020.   Specialties: Obstetrics and Gynecology, Radiology Contact information: [place] [address] [phone]              Allergies  Allergen Reactions  . Cozaar [Losartan] Swelling    Oral swelling     Consultations:  Discussed with gynecology Dr. Elonda Husky  Discussed with cardiology Dr. Harl Bowie   Procedures/Studies: DG Abdomen Acute W/Chest  Result Date: 07/15/2020 CLINICAL DATA:  Abdominal pain EXAM: DG ABDOMEN ACUTE WITH 1 VIEW CHEST COMPARISON:  Abdomen 04/20/2015 FINDINGS: Heart size and vascularity normal. Atherosclerotic calcification aortic arch. Lungs are clear. Nonobstructive bowel gas pattern. No dilated bowel loops. No free air. Surgical clips right upper quadrant. IMPRESSION: Negative abdominal radiographs.  No acute cardiopulmonary disease. Electronically Signed   By: Franchot Gallo M.D.   On: 07/15/2020 10:31     Discharge Exam: Vitals:   07/16/20 0800 07/16/20 0900  BP: (!) 142/53 (!) 160/59  Pulse: 61 77  Resp: 18 (!) 24  Temp:    SpO2: 96% 98%   Vitals:   07/16/20 0730 07/16/20 0757 07/16/20 0800 07/16/20 0900  BP:  (!)  144/49 (!) 142/53 (!) 160/59  Pulse: 69 70 61 77  Resp: (!) 21 (!) 33 18 (!) 24  Temp:      TempSrc:      SpO2: 94% 97% 96% 98%  Weight:      Height:        General: Pt is alert, awake, not in acute distress Cardiovascular: RRR, S1/S2 +, no rubs, no gallops Respiratory: CTA bilaterally, no wheezing, no rhonchi Abdominal: Soft, NT, ND, bowel sounds + Extremities: no edema, no cyanosis    The results of significant diagnostics from this hospitalization (including imaging, microbiology, ancillary and laboratory) are listed below for reference.     Microbiology: Recent Results (from the past 240 hour(s))  Resp Panel by RT-PCR (Flu A&B, Covid) Nasopharyngeal Swab     Status: None   Collection Time: 07/15/20 10:27 AM   Specimen: Nasopharyngeal Swab; Nasopharyngeal(NP) swabs in vial transport medium  Result Value Ref Range  Status   SARS Coronavirus 2 by RT PCR NEGATIVE NEGATIVE Final    Comment: (NOTE) SARS-CoV-2 target nucleic acids are NOT DETECTED.  The SARS-CoV-2 RNA is generally detectable in upper respiratory specimens during the acute phase of infection. The lowest concentration of SARS-CoV-2 viral copies this assay can detect is 138 copies/mL. A negative result does not preclude SARS-Cov-2 infection and should not be used as the sole basis for treatment or other patient management decisions. A negative result may occur with  improper specimen collection/handling, submission of specimen other than nasopharyngeal swab, presence of viral mutation(s) within the areas targeted by this assay, and inadequate number of viral copies(<138 copies/mL). A negative result must be combined with clinical observations, patient history, and epidemiological information. The expected result is Negative.  Fact Sheet for Patients:  EntrepreneurPulse.com.au  Fact Sheet for Healthcare Providers:  IncredibleEmployment.be  This test is no t yet approved or  cleared by the [place] FDA and  has been authorized for detection and/or diagnosis of SARS-CoV-2 by FDA under an Emergency Use Authorization (EUA). This EUA will remain  in effect (meaning this test can be used) for the duration of the COVID-19 declaration under Section 564(b)(1) of the Act, 21 U.S.C.section 360bbb-3(b)(1), unless the authorization is terminated  or revoked sooner.       Influenza A by PCR NEGATIVE NEGATIVE Final   Influenza B by PCR NEGATIVE NEGATIVE Final    Comment: (NOTE) The Xpert Xpress SARS-CoV-2/FLU/RSV plus assay is intended as an aid in the diagnosis of influenza from Nasopharyngeal swab specimens and should not be used as a sole basis for treatment. Nasal washings and aspirates are unacceptable for Xpert Xpress SARS-CoV-2/FLU/RSV testing.  Fact Sheet for Patients: EntrepreneurPulse.com.au  Fact Sheet for Healthcare Providers: IncredibleEmployment.be  This test is not yet approved or cleared by the [place] FDA and has been authorized for detection and/or diagnosis of SARS-CoV-2 by FDA under an Emergency Use Authorization (EUA). This EUA will remain in effect (meaning this test can be used) for the duration of the COVID-19 declaration under Section 564(b)(1) of the Act, 21 U.S.C. section 360bbb-3(b)(1), unless the authorization is terminated or revoked.  Performed at [facility], [address] [place], [place] [address]      Labs: BNP (last 3 results) Recent Labs    05/26/20 1248  BNP 124.5*   Basic Metabolic Panel: Recent Labs  Lab 07/15/20 1016 07/16/20 0327  NA 134* 136  K 3.6 3.5  CL 98 100  CO2 28 28  GLUCOSE 108* 82  BUN 17 15  CREATININE 0.90 0.64  CALCIUM 9.3 8.5*  MG 2.1 1.9   Liver Function Tests: Recent Labs  Lab 07/15/20 1016 07/16/20 0327  AST 18 14*  ALT 15 11  ALKPHOS 61 47  BILITOT 0.9 1.0  PROT 8.1 6.4*  ALBUMIN 4.2 3.3*   No results for input(s): LIPASE,  AMYLASE in the last 168 hours. No results for input(s): AMMONIA in the last 168 hours. CBC: Recent Labs  Lab 07/15/20 1016 07/16/20 0327  WBC 5.6 5.7  NEUTROABS 4.4 4.0  HGB 13.0 10.7*  HCT 41.9 34.7*  MCV 87.5 86.8  PLT 139* 125*   Cardiac Enzymes: No results for input(s): CKTOTAL, CKMB, CKMBINDEX, TROPONINI in the last 168 hours. BNP: Invalid input(s): POCBNP CBG: No results for input(s): GLUCAP in the last 168 hours. D-Dimer No results for input(s): DDIMER in the last 72 hours. Hgb A1c No results for input(s): HGBA1C in the last 72  hours. Lipid Profile No results for input(s): CHOL, HDL, LDLCALC, TRIG, CHOLHDL, LDLDIRECT in the last 72 hours. Thyroid function studies No results for input(s): TSH, T4TOTAL, T3FREE, THYROIDAB in the last 72 hours.  Invalid input(s): FREET3 Anemia work up No results for input(s): VITAMINB12, FOLATE, FERRITIN, TIBC, IRON, RETICCTPCT in the last 72 hours. Urinalysis    Component Value Date/Time   COLORURINE YELLOW 03/04/2020 1354   APPEARANCEUR CLEAR 03/04/2020 1354   LABSPEC >1.046 (H) 03/04/2020 1354   PHURINE 6.0 03/04/2020 1354   GLUCOSEU NEGATIVE 03/04/2020 1354   HGBUR NEGATIVE 03/04/2020 1354   BILIRUBINUR NEGATIVE 03/04/2020 1354   KETONESUR 5 (A) 03/04/2020 1354   PROTEINUR 30 (A) 03/04/2020 1354   UROBILINOGEN 0.2 04/20/2015 1445   NITRITE NEGATIVE 03/04/2020 1354   LEUKOCYTESUR NEGATIVE 03/04/2020 1354   Sepsis Labs Invalid input(s): PROCALCITONIN,  WBC,  LACTICIDVEN Microbiology Recent Results (from the past 240 hour(s))  Resp Panel by RT-PCR (Flu A&B, Covid) Nasopharyngeal Swab     Status: None   Collection Time: 07/15/20 10:27 AM   Specimen: Nasopharyngeal Swab; Nasopharyngeal(NP) swabs in vial transport medium  Result Value Ref Range Status   SARS Coronavirus 2 by RT PCR NEGATIVE NEGATIVE Final    Comment: (NOTE) SARS-CoV-2 target nucleic acids are NOT DETECTED.  The SARS-CoV-2 RNA is generally detectable in  upper respiratory specimens during the acute phase of infection. The lowest concentration of SARS-CoV-2 viral copies this assay can detect is 138 copies/mL. A negative result does not preclude SARS-Cov-2 infection and should not be used as the sole basis for treatment or other patient management decisions. A negative result may occur with  improper specimen collection/handling, submission of specimen other than nasopharyngeal swab, presence of viral mutation(s) within the areas targeted by this assay, and inadequate number of viral copies(<138 copies/mL). A negative result must be combined with clinical observations, patient history, and epidemiological information. The expected result is Negative.  Fact Sheet for Patients:  EntrepreneurPulse.com.au  Fact Sheet for Healthcare Providers:  IncredibleEmployment.be  This test is no t yet approved or cleared by the [place] FDA and  has been authorized for detection and/or diagnosis of SARS-CoV-2 by FDA under an Emergency Use Authorization (EUA). This EUA will remain  in effect (meaning this test can be used) for the duration of the COVID-19 declaration under Section 564(b)(1) of the Act, 21 U.S.C.section 360bbb-3(b)(1), unless the authorization is terminated  or revoked sooner.       Influenza A by PCR NEGATIVE NEGATIVE Final   Influenza B by PCR NEGATIVE NEGATIVE Final    Comment: (NOTE) The Xpert Xpress SARS-CoV-2/FLU/RSV plus assay is intended as an aid in the diagnosis of influenza from Nasopharyngeal swab specimens and should not be used as a sole basis for treatment. Nasal washings and aspirates are unacceptable for Xpert Xpress SARS-CoV-2/FLU/RSV testing.  Fact Sheet for Patients: EntrepreneurPulse.com.au  Fact Sheet for Healthcare Providers: IncredibleEmployment.be  This test is not yet approved or cleared by the [place] FDA and has been  authorized for detection and/or diagnosis of SARS-CoV-2 by FDA under an Emergency Use Authorization (EUA). This EUA will remain in effect (meaning this test can be used) for the duration of the COVID-19 declaration under Section 564(b)(1) of the Act, 21 U.S.C. section 360bbb-3(b)(1), unless the authorization is terminated or revoked.  Performed at [facility], [address], [place], [place] [address]      Time coordinating discharge: 35 minutes  SIGNED:   Rodena Goldmann, DO [facility]  07/16/2020, 9:15 AM  If 7PM-7AM, please contact night-coverage [email or web address]

## 2020-07-16 NOTE — ED Notes (Signed)
Son updated on status.

## 2020-07-16 NOTE — ED Notes (Signed)
Pt to bathroom ambulatory with four prong cane, gait steady.

## 2020-07-18 ENCOUNTER — Ambulatory Visit: Payer: Medicaid Other | Admitting: Obstetrics & Gynecology

## 2020-07-29 ENCOUNTER — Encounter: Payer: Self-pay | Admitting: Obstetrics & Gynecology

## 2020-07-29 ENCOUNTER — Ambulatory Visit (INDEPENDENT_AMBULATORY_CARE_PROVIDER_SITE_OTHER): Payer: Medicare Other | Admitting: Obstetrics & Gynecology

## 2020-07-29 ENCOUNTER — Other Ambulatory Visit: Payer: Self-pay

## 2020-07-29 VITALS — BP 131/72 | HR 68 | Ht 63.0 in | Wt 124.5 lb

## 2020-07-29 DIAGNOSIS — N813 Complete uterovaginal prolapse: Secondary | ICD-10-CM | POA: Diagnosis not present

## 2020-07-29 DIAGNOSIS — Z4689 Encounter for fitting and adjustment of other specified devices: Secondary | ICD-10-CM

## 2020-07-29 NOTE — Progress Notes (Signed)
Chief Complaint  Patient presents with  . Pessary Check    Blood pressure 131/72, pulse 68, height 5\' 3"  (1.6 m), weight 124 lb 8 oz (56.5 kg).  presents today for routine follow up related to her pessary.   She uses a Milex ring with support #4 She reports no vaginal discharge and no vaginal bleeding   Likert scale(1 not bothersome -5 very bothersome)  :  1  Exam reveals no undue vaginal mucosal pressure of breakdown, no discharge and no vaginal bleeding.  Vaginal Epithelial Abnormality Classification System:   0 0    No abnormalities 1    Epithelial erythema 2    Granulation tissue 3    Epithelial break or erosion, 1 cm or less 4    Epithelial break or erosion, 1 cm or greater  The pessary is removed, cleaned and replaced without difficulty.      ICD-10-CM   1. Pessary maintenance, Milex ring with support #4, original fit 08/2015  Z46.89   2. Uterine procidentia  N81.3      [name] will be sen back in 4 months for continued follow up.  Jodene Nam, MD  07/29/2020 12:09 PM

## 2020-08-14 ENCOUNTER — Other Ambulatory Visit: Payer: Self-pay

## 2020-08-14 ENCOUNTER — Ambulatory Visit (INDEPENDENT_AMBULATORY_CARE_PROVIDER_SITE_OTHER): Payer: Medicare Other | Admitting: Physician Assistant

## 2020-08-14 ENCOUNTER — Encounter: Payer: Self-pay | Admitting: Physician Assistant

## 2020-08-14 VITALS — BP 124/60 | HR 63 | Ht 63.0 in | Wt 131.2 lb

## 2020-08-14 DIAGNOSIS — I10 Essential (primary) hypertension: Secondary | ICD-10-CM

## 2020-08-14 DIAGNOSIS — I471 Supraventricular tachycardia: Secondary | ICD-10-CM | POA: Diagnosis not present

## 2020-08-14 DIAGNOSIS — I4510 Unspecified right bundle-branch block: Secondary | ICD-10-CM

## 2020-08-14 NOTE — Progress Notes (Signed)
Cardiology Office Note:    Date:  08/14/2020   ID:  [name], DOB 1919-11-27, MRN [id number]  PCP:  Loman Brooklyn, FNP  [facility] Cardiologist:  Sinclair Grooms, MD  [facility] Electrophysiologist:  None   Chief Complaint: hospital follow up   History of Present Illness:    [name] is a 87 y.o. female with a hx of paroxysmal supraventricular tachycardia, chronic right bundle branch block, hypertension, hyperlipidemia, CKD stage III, and breast cancer s/p lumpectomy seen for hospital follow-up.  History of syncope August 2021.  Echocardiogram showed LV function of 60 to 65% and grade 1 diastolic dysfunction.  Follow-up monitor showed SVT which felt because of his syncope.  Went to ER October 2021 for SVT.  She was given IV Lopressor with conversion back to sinus rhythm.  Recommended to care for up titration of beta-blocker given underlying trifascicular block.  Patient may eventually need pacemaker per note.  Admitted December 2021 for vaginal bleeding and SVT.  She received 2 doses of adenosine and IV Lopressor with conversion to sinus rhythm. There was concern for afib on monitor. No change in home BB. Outpatient follow up with cardiology recommended.    Here for follow up with daughter. Pt lives by herself. Able to do some household chores. Cooks by her self. Used cane occasionally. Denies chest pain, dyspnea, palpitations, orthopnea, PND, syncope, LE edema, dizziness or syncope. No episode of tachycardia since last admit.   Past Medical History:  Diagnosis Date  . Arthralgia   . Breast cancer ([place])    left breast   . Constipation   . Dyspepsia   . GERD (gastroesophageal reflux disease)   . High cholesterol   . Hypertension   . NSVT (nonsustained ventricular tachycardia) (HCC)    Short run of monomorphic VT in setting of hypokalemia during 09/2011 hospitalization. EF 60-65% by echo 09/26/11  . Right bundle branch block   . SVT (supraventricular  tachycardia) ([place])    Noted 09/2011 responsive to cardizem  . Vertigo     Past Surgical History:  Procedure Laterality Date  . BREAST LUMPECTOMY     left breast cancer years ago  . CHOLECYSTECTOMY    . EYE SURGERY     1- eye - cataract removal   . TONSILLECTOMY      Current Medications: Current Meds  Medication Sig  . amLODipine (NORVASC) 10 MG tablet TAKE 1 TABLET BY MOUTH EVERY DAY IN THE MORNING  . aspirin EC 81 MG tablet Take 81 mg by mouth daily.  Marland Kitchen esomeprazole (NEXIUM) 40 MG capsule TAKE 1 CAPSULE BY MOUTH EVERY DAY  . fluticasone (FLONASE) 50 MCG/ACT nasal spray INSTILL 2 SPRAYS IN EACH NOSTRIL ONCE A DAY (Patient taking differently: as needed.)  . KLOR-CON M20 20 MEQ tablet TAKE 1 TABLET BY MOUTH EVERY DAY  . Metoprolol Tartrate 75 MG TABS Take 75 mg by mouth 2 (two) times daily.  . polyethylene glycol (MIRALAX / GLYCOLAX) 17 g packet Take 17 g by mouth daily.      Allergies:   Cozaar [losartan]   Social History   Socioeconomic History  . Marital status: Widowed    Spouse name: Not on file  . Number of children: 4  . Years of education: Not on file  . Highest education level: Not on file  Occupational History  . Not on file  Tobacco Use  . Smoking status: Never Smoker  . Smokeless tobacco: Never Used  Vaping  Use  . Vaping Use: Never used  Substance and Sexual Activity  . Alcohol use: No  . Drug use: No  . Sexual activity: Not Currently    Birth control/protection: Post-menopausal  Other Topics Concern  . Not on file  Social History Narrative   5 children - 1 passed away,    Lives alone - has alert   Social Determinants of Health   Financial Resource Strain: Not on file  Food Insecurity: Not on file  Transportation Needs: Not on file  Physical Activity: Not on file  Stress: Not on file  Social Connections: Not on file     Family History: The patient's family history includes Hypertension in her father and mother; Tuberculosis in her father.     ROS:   Please see the history of present illness.    All other systems reviewed and are negative.   EKGs/Labs/Other Studies Reviewed:    The following studies were reviewed today:  Echo 03/2020 Left ventricular ejection fraction, by estimation, is 60 to 65%. The  left ventricle has normal function. The left ventricle has no regional  wall motion abnormalities. There is mild left ventricular hypertrophy.  Left ventricular diastolic parameters  are consistent with Grade I diastolic dysfunction (impaired relaxation).  Elevated left atrial pressure.  2. Right ventricular systolic function is normal. The right ventricular  size is normal. There is severely elevated pulmonary artery systolic  pressure.  3. The mitral valve is normal in structure. Trivial mitral valve  regurgitation. No evidence of mitral stenosis.  4. The aortic valve is tricuspid. Aortic valve regurgitation is not  visualized. No aortic stenosis is present.  5. The inferior vena cava is normal in size with greater than 50%  respiratory variability, suggesting right atrial pressure of 3 mmHg.   Monitor 03/2020  14 day zio patch  Min HR 54, Max HR 207. Avg HR 79  Occasional supraventricular ectopy in the form of PACs, couplets, triplets  Rare ventricular ectopy in the form of isolateted PVCs, couplets, triples  No symptoms reported  Frequent runs of SVT, longest lasting 37 minutes.    EKG:  EKG is not  ordered today.   Recent Labs: 03/04/2020: TSH 1.567 05/26/2020: B Natriuretic Peptide 203.0 07/16/2020: ALT 11; BUN 15; Creatinine, Ser 0.64; Hemoglobin 10.7; Magnesium 1.9; Platelets 125; Potassium 3.5; Sodium 136  Recent Lipid Panel    Component Value Date/Time   CHOL 158 11/20/2019 1051   TRIG 48 11/20/2019 1051   HDL 58 11/20/2019 1051   CHOLHDL 2.7 11/20/2019 1051   LDLCALC 90 11/20/2019 1051    Physical Exam:    VS:  BP 124/60   Pulse 63   Ht 5\' 3"  (1.6 m)   Wt 131 lb 3.2 oz (59.5 kg)    SpO2 97%   BMI 23.24 kg/m     Wt Readings from Last 3 Encounters:  08/14/20 131 lb 3.2 oz (59.5 kg)  07/29/20 124 lb 8 oz (56.5 kg)  07/15/20 129 lb (58.5 kg)     GEN: thin frail elderly female in no acute distress HEENT: Normal NECK: No JVD; No carotid bruits LYMPHATICS: No lymphadenopathy CARDIAC: RRR, no murmurs, rubs, gallops RESPIRATORY:  Clear to auscultation without rales, wheezing or rhonchi  ABDOMEN: Soft, non-tender, non-distended MUSCULOSKELETAL:  No edema; No deformity  SKIN: Warm and dry NEUROLOGIC:  Alert and oriented x 3 PSYCHIATRIC:  Normal affect   ASSESSMENT AND PLAN:    1. PSVT - No recurrent episode since last  admit. Continue metoprolol 75mg  BID. Unable to uptitrate further given underlying trifascicular block. Patient and daughter not interested in PPM or ablation of SVT. Discuss vagal manuaver. She may take extra metoprolol for breakthrough palpitations. No syncope.   2.  Possible atrial fibrillation -There was mention of possible atrial fibrillation on discharge note based on tele.   I have reviewed strip in media with DOD Dr. Johney Frame as well. No clear evidence of afib.   3. HTN - BP stable   Medication Adjustments/Labs and Tests Ordered: Current medicines are reviewed at length with the patient today.  Concerns regarding medicines are outlined above.  No orders of the defined types were placed in this encounter.  No orders of the defined types were placed in this encounter.   Patient Instructions  Medication Instructions:  Your physician recommends that you continue on your current medications as directed. Please refer to the Current Medication list given to you today.  *If you need a refill on your cardiac medications before your next appointment, please call your pharmacy*   Lab Work: None ordered  If you have labs (blood work) drawn today and your tests are completely normal, you will receive your results only by: Marland Kitchen MyChart Message (if  you have MyChart) OR . A paper copy in the mail If you have any lab test that is abnormal or we need to change your treatment, we will call you to review the results.   Testing/Procedures: None ordered   Follow-Up: At [facility], you and your health needs are our priority.  As part of our continuing mission to provide you with exceptional heart care, we have created designated Provider Care Teams.  These Care Teams include your primary Cardiologist (physician) and Advanced Practice Providers (APPs -  Physician Assistants and Nurse Practitioners) who all work together to provide you with the care you need, when you need it.  We recommend signing up for the patient portal called "MyChart".  Sign up information is provided on this After Visit Summary.  MyChart is used to connect with patients for Virtual Visits (Telemedicine).  Patients are able to view lab/test results, encounter notes, upcoming appointments, etc.  Non-urgent messages can be sent to your provider as well.   To learn more about what you can do with MyChart, go to [email or web address].    Your next appointment:   3 Months    The format for your next appointment:   In Person  Provider:   Daneen Schick, MD   Other Instructions      Signed, Leanor Kail, PA  08/14/2020 11:09 AM    [place]

## 2020-08-14 NOTE — Patient Instructions (Addendum)
Medication Instructions:  Your physician recommends that you continue on your current medications as directed. Please refer to the Current Medication list given to you today.  *If you need a refill on your cardiac medications before your next appointment, please call your pharmacy*   Lab Work: None ordered  If you have labs (blood work) drawn today and your tests are completely normal, you will receive your results only by: Marland Kitchen MyChart Message (if you have MyChart) OR . A paper copy in the mail If you have any lab test that is abnormal or we need to change your treatment, we will call you to review the results.   Testing/Procedures: None ordered   Follow-Up: At [facility], you and your health needs are our priority.  As part of our continuing mission to provide you with exceptional heart care, we have created designated Provider Care Teams.  These Care Teams include your primary Cardiologist (physician) and Advanced Practice Providers (APPs -  Physician Assistants and Nurse Practitioners) who all work together to provide you with the care you need, when you need it.  We recommend signing up for the patient portal called "MyChart".  Sign up information is provided on this After Visit Summary.  MyChart is used to connect with patients for Virtual Visits (Telemedicine).  Patients are able to view lab/test results, encounter notes, upcoming appointments, etc.  Non-urgent messages can be sent to your provider as well.   To learn more about what you can do with MyChart, go to [email or web address].    Your next appointment:   3 Months    The format for your next appointment:   In Person  Provider:   Daneen Schick, MD   Other Instructions

## 2020-08-28 ENCOUNTER — Other Ambulatory Visit: Payer: Self-pay | Admitting: Family Medicine

## 2020-09-09 ENCOUNTER — Ambulatory Visit: Payer: Medicare Other | Admitting: Interventional Cardiology

## 2020-09-23 ENCOUNTER — Ambulatory Visit (INDEPENDENT_AMBULATORY_CARE_PROVIDER_SITE_OTHER): Payer: Medicare Other | Admitting: Family Medicine

## 2020-09-23 ENCOUNTER — Encounter: Payer: Self-pay | Admitting: Family Medicine

## 2020-09-23 ENCOUNTER — Other Ambulatory Visit: Payer: Self-pay

## 2020-09-23 VITALS — BP 148/66 | HR 73 | Temp 96.8°F | Ht 63.0 in | Wt 126.6 lb

## 2020-09-23 DIAGNOSIS — I4510 Unspecified right bundle-branch block: Secondary | ICD-10-CM | POA: Diagnosis not present

## 2020-09-23 DIAGNOSIS — N1831 Chronic kidney disease, stage 3a: Secondary | ICD-10-CM

## 2020-09-23 DIAGNOSIS — E782 Mixed hyperlipidemia: Secondary | ICD-10-CM

## 2020-09-23 DIAGNOSIS — I471 Supraventricular tachycardia: Secondary | ICD-10-CM

## 2020-09-23 DIAGNOSIS — R7303 Prediabetes: Secondary | ICD-10-CM | POA: Insufficient documentation

## 2020-09-23 DIAGNOSIS — D696 Thrombocytopenia, unspecified: Secondary | ICD-10-CM

## 2020-09-23 DIAGNOSIS — I10 Essential (primary) hypertension: Secondary | ICD-10-CM | POA: Diagnosis not present

## 2020-09-23 DIAGNOSIS — K219 Gastro-esophageal reflux disease without esophagitis: Secondary | ICD-10-CM

## 2020-09-23 DIAGNOSIS — E876 Hypokalemia: Secondary | ICD-10-CM | POA: Insufficient documentation

## 2020-09-23 LAB — BAYER DCA HB A1C WAIVED: HB A1C (BAYER DCA - WAIVED): 5.7 % (ref <7.0)

## 2020-09-23 MED ORDER — METOPROLOL TARTRATE 75 MG PO TABS: 75.0000 mg | ORAL_TABLET | Freq: Two times a day (BID) | ORAL | 1 refills | Status: DC

## 2020-09-23 MED ORDER — ESOMEPRAZOLE MAGNESIUM 40 MG PO CPDR
DELAYED_RELEASE_CAPSULE | ORAL | 1 refills | Status: DC
Start: 2020-09-23 — End: 2021-04-13

## 2020-09-23 MED ORDER — KLOR-CON M20 20 MEQ PO TBCR: 20.0000 meq | EXTENDED_RELEASE_TABLET | Freq: Every day | ORAL | 1 refills | Status: DC

## 2020-09-23 NOTE — Progress Notes (Signed)
Assessment & Plan:  1. Essential hypertension, benign Well controlled on current regimen.  - CMP14+EGFR - Lipid panel  2. Right bundle branch block Well controlled on current regimen.  Followed by cardiology.  3. Paroxysmal SVT (supraventricular tachycardia) (HCC) Well controlled on current regimen.  - Metoprolol Tartrate 75 MG TABS; Take 75 mg by mouth 2 (two) times daily.  Dispense: 180 tablet; Refill: 1 - CMP14+EGFR  4. Mixed hyperlipidemia Well controlled on current regimen.  Labs today to assess. - CMP14+EGFR - Lipid panel  5. Prediabetes Labs to assess. - Bayer DCA Hb A1c Waived  6. Hypokalemia Well controlled on current regimen.  - KLOR-CON M20 20 MEQ tablet; Take 1 tablet (20 mEq total) by mouth daily.  Dispense: 90 tablet; Refill: 1 - CMP14+EGFR  7. Gastroesophageal reflux disease without esophagitis Well controlled on current regimen.  - esomeprazole (NEXIUM) 40 MG capsule; TAKE 1 CAPSULE BY MOUTH EVERY DAY  Dispense: 90 capsule; Refill: 1 - CMP14+EGFR  8. Stage 3a chronic kidney disease ([place]) Labs to assess. - CMP14+EGFR  9. Thrombocytopenia ([place]) Labs to assess. - CBC with Differential/Platelet   No follow-ups on file.  Hendricks Limes, MSN, APRN, FNP-C [facility]  Subjective:    Patient ID: [name], female    DOB: [birth year], 87 y.o.   MRN: [id number]  Patient Care Team: Loman Brooklyn, FNP as PCP - General (Family Medicine) Belva Crome, MD as PCP - Cardiology (Cardiology)   Chief Complaint:  Chief Complaint  Patient presents with  . Hypertension    3 month follow up of chronic medical conditions     HPI: [name] is a 87 y.o. female presenting on 09/23/2020 for Hypertension (3 month follow up of chronic medical conditions/)  Patient denies any symptoms of chest pain, palpitations, syncope, dizziness, or tachycardia.  She does have shortness of breath with exertion.  New  complaints: None  Social history:  Relevant past medical, surgical, family and social history reviewed and updated as indicated. Interim medical history since our last visit reviewed.  Allergies and medications reviewed and updated.  DATA REVIEWED: CHART IN EPIC  ROS: Negative unless specifically indicated above in HPI.    Current Outpatient Medications:  .  amLODipine (NORVASC) 10 MG tablet, TAKE 1 TABLET BY MOUTH EVERY DAY IN THE MORNING, Disp: 90 tablet, Rfl: 3 .  aspirin EC 81 MG tablet, Take 81 mg by mouth daily., Disp: , Rfl:  .  esomeprazole (NEXIUM) 40 MG capsule, TAKE 1 CAPSULE BY MOUTH EVERY DAY, Disp: 90 capsule, Rfl: 0 .  fluticasone (FLONASE) 50 MCG/ACT nasal spray, INSTILL 2 SPRAYS IN EACH NOSTRIL ONCE A DAY (Patient taking differently: as needed.), Disp: 48 mL, Rfl: 6 .  KLOR-CON M20 20 MEQ tablet, TAKE 1 TABLET BY MOUTH EVERY DAY, Disp: 90 tablet, Rfl: 0 .  Metoprolol Tartrate 75 MG TABS, TAKE 75 MG BY MOUTH 2 (TWO) TIMES DAILY., Disp: 60 tablet, Rfl: 1 .  polyethylene glycol (MIRALAX / GLYCOLAX) 17 g packet, Take 17 g by mouth daily. , Disp: , Rfl:    Allergies  Allergen Reactions  . Cozaar [Losartan] Swelling    Oral swelling    Past Medical History:  Diagnosis Date  . Arthralgia   . Breast cancer ([place])    left breast   . Constipation   . Dyspepsia   . GERD (gastroesophageal reflux disease)   . High cholesterol   . Hypertension   . NSVT (nonsustained ventricular  tachycardia) ([place])    Short run of monomorphic VT in setting of hypokalemia during 09/2011 hospitalization. EF 60-65% by echo 09/26/11  . Right bundle branch block   . SVT (supraventricular tachycardia) ([place])    Noted 09/2011 responsive to cardizem  . Vertigo     Past Surgical History:  Procedure Laterality Date  . BREAST LUMPECTOMY     left breast cancer years ago  . CHOLECYSTECTOMY    . EYE SURGERY     1- eye - cataract removal   . TONSILLECTOMY      Social History   Socioeconomic  History  . Marital status: Widowed    Spouse name: Not on file  . Number of children: 4  . Years of education: Not on file  . Highest education level: Not on file  Occupational History  . Not on file  Tobacco Use  . Smoking status: Never Smoker  . Smokeless tobacco: Never Used  Vaping Use  . Vaping Use: Never used  Substance and Sexual Activity  . Alcohol use: No  . Drug use: No  . Sexual activity: Not Currently    Birth control/protection: Post-menopausal  Other Topics Concern  . Not on file  Social History Narrative   5 children - 1 passed away,    Lives alone - has alert   Social Determinants of Health   Financial Resource Strain: Not on file  Food Insecurity: Not on file  Transportation Needs: Not on file  Physical Activity: Not on file  Stress: Not on file  Social Connections: Not on file  Intimate Partner Violence: Not on file        Objective:    BP (!) 148/66   Pulse 73   Temp (!) 96.8 F (36 C) (Temporal)   Ht '5\' 3"'  (1.6 m)   Wt 126 lb 9.6 oz (57.4 kg)   BMI 22.43 kg/m   Wt Readings from Last 3 Encounters:  09/23/20 126 lb 9.6 oz (57.4 kg)  08/14/20 131 lb 3.2 oz (59.5 kg)  07/29/20 124 lb 8 oz (56.5 kg)    Physical Exam Vitals reviewed.  Constitutional:      General: She is not in acute distress.    Appearance: Normal appearance. She is normal weight. She is not ill-appearing, toxic-appearing or diaphoretic.  HENT:     Head: Normocephalic and atraumatic.  Eyes:     General: No scleral icterus.       Right eye: No discharge.        Left eye: No discharge.     Conjunctiva/sclera: Conjunctivae normal.  Cardiovascular:     Rate and Rhythm: Normal rate and regular rhythm.     Heart sounds: Normal heart sounds. No murmur heard. No friction rub. No gallop.   Pulmonary:     Effort: Pulmonary effort is normal. No respiratory distress.     Breath sounds: No stridor. Examination of the right-upper field reveals wheezing. Examination of the  right-middle field reveals wheezing. Examination of the right-lower field reveals wheezing. Wheezing present. No rhonchi or rales.  Musculoskeletal:        General: Normal range of motion.     Cervical back: Normal range of motion.  Skin:    General: Skin is warm and dry.     Capillary Refill: Capillary refill takes less than 2 seconds.  Neurological:     General: No focal deficit present.     Mental Status: She is alert and oriented to person, place, and time.  Mental status is at baseline.  Psychiatric:        Mood and Affect: Mood normal.        Behavior: Behavior normal.        Thought Content: Thought content normal.        Judgment: Judgment normal.     Lab Results  Component Value Date   TSH 1.567 03/04/2020   Lab Results  Component Value Date   WBC 5.7 07/16/2020   HGB 10.7 (L) 07/16/2020   HCT 34.7 (L) 07/16/2020   MCV 86.8 07/16/2020   PLT 125 (L) 07/16/2020   Lab Results  Component Value Date   NA 136 07/16/2020   K 3.5 07/16/2020   CO2 28 07/16/2020   GLUCOSE 82 07/16/2020   BUN 15 07/16/2020   CREATININE 0.64 07/16/2020   BILITOT 1.0 07/16/2020   ALKPHOS 47 07/16/2020   AST 14 (L) 07/16/2020   ALT 11 07/16/2020   PROT 6.4 (L) 07/16/2020   ALBUMIN 3.3 (L) 07/16/2020   CALCIUM 8.5 (L) 07/16/2020   ANIONGAP 8 07/16/2020   Lab Results  Component Value Date   CHOL 158 11/20/2019   Lab Results  Component Value Date   HDL 58 11/20/2019   Lab Results  Component Value Date   LDLCALC 90 11/20/2019   Lab Results  Component Value Date   TRIG 48 11/20/2019   Lab Results  Component Value Date   CHOLHDL 2.7 11/20/2019   Lab Results  Component Value Date   HGBA1C 5.9 (H) 03/04/2020

## 2020-09-24 LAB — CMP14+EGFR
ALT: 14 IU/L (ref 0–32)
AST: 17 IU/L (ref 0–40)
Albumin/Globulin Ratio: 1.4 (ref 1.2–2.2)
Albumin: 4.2 g/dL (ref 3.5–4.6)
Alkaline Phosphatase: 61 IU/L (ref 44–121)
BUN/Creatinine Ratio: 23 (ref 12–28)
BUN: 23 mg/dL (ref 10–36)
Bilirubin Total: 0.4 mg/dL (ref 0.0–1.2)
CO2: 25 mmol/L (ref 20–29)
Calcium: 9.4 mg/dL (ref 8.7–10.3)
Chloride: 101 mmol/L (ref 96–106)
Creatinine, Ser: 1.02 mg/dL — ABNORMAL HIGH (ref 0.57–1.00)
GFR calc Af Amer: 53 mL/min/{1.73_m2} — ABNORMAL LOW (ref >59)
GFR calc non Af Amer: 46 mL/min/{1.73_m2} — ABNORMAL LOW (ref >59)
Globulin, Total: 3 g/dL (ref 1.5–4.5)
Glucose: 87 mg/dL (ref 65–99)
Potassium: 4.3 mmol/L (ref 3.5–5.2)
Sodium: 142 mmol/L (ref 134–144)
Total Protein: 7.2 g/dL (ref 6.0–8.5)

## 2020-09-24 LAB — CBC WITH DIFFERENTIAL/PLATELET
Basophils Absolute: 0 10*3/uL (ref 0.0–0.2)
Basos: 0 %
EOS (ABSOLUTE): 0 10*3/uL (ref 0.0–0.4)
Eos: 1 %
Hematocrit: 36.9 % (ref 34.0–46.6)
Hemoglobin: 11.7 g/dL (ref 11.1–15.9)
Immature Grans (Abs): 0 10*3/uL (ref 0.0–0.1)
Immature Granulocytes: 0 %
Lymphocytes Absolute: 1.2 10*3/uL (ref 0.7–3.1)
Lymphs: 27 %
MCH: 26.9 pg (ref 26.6–33.0)
MCHC: 31.7 g/dL (ref 31.5–35.7)
MCV: 85 fL (ref 79–97)
Monocytes Absolute: 0.4 10*3/uL (ref 0.1–0.9)
Monocytes: 9 %
Neutrophils Absolute: 2.7 10*3/uL (ref 1.4–7.0)
Neutrophils: 63 %
Platelets: 146 10*3/uL — ABNORMAL LOW (ref 150–450)
RBC: 4.35 x10E6/uL (ref 3.77–5.28)
RDW: 14.7 % (ref 11.7–15.4)
WBC: 4.3 10*3/uL (ref 3.4–10.8)

## 2020-09-24 LAB — LIPID PANEL
Chol/HDL Ratio: 2.6 ratio (ref 0.0–4.4)
Cholesterol, Total: 152 mg/dL (ref 100–199)
HDL: 59 mg/dL (ref >39)
LDL Chol Calc (NIH): 82 mg/dL (ref 0–99)
Triglycerides: 53 mg/dL (ref 0–149)
VLDL Cholesterol Cal: 11 mg/dL (ref 5–40)

## 2020-11-27 ENCOUNTER — Ambulatory Visit (INDEPENDENT_AMBULATORY_CARE_PROVIDER_SITE_OTHER): Payer: Medicare Other | Admitting: Obstetrics & Gynecology

## 2020-11-27 ENCOUNTER — Other Ambulatory Visit: Payer: Self-pay

## 2020-11-27 VITALS — BP 129/74 | HR 59 | Wt 126.0 lb

## 2020-11-27 DIAGNOSIS — Z4689 Encounter for fitting and adjustment of other specified devices: Secondary | ICD-10-CM | POA: Diagnosis not present

## 2020-11-27 DIAGNOSIS — N813 Complete uterovaginal prolapse: Secondary | ICD-10-CM | POA: Diagnosis not present

## 2020-12-13 ENCOUNTER — Other Ambulatory Visit: Payer: Self-pay | Admitting: Family Medicine

## 2020-12-13 DIAGNOSIS — E876 Hypokalemia: Secondary | ICD-10-CM

## 2020-12-14 NOTE — Progress Notes (Incomplete)
Cardiology Office Note:    Date:  12/14/2020   ID:  [name], DOB Dec 07, 1919, MRN [id number]  PCP:  Loman Brooklyn, FNP  Cardiologist:  Sinclair Grooms, MD   Referring MD: Loman Brooklyn, FNP   No chief complaint on file.   History of Present Illness:    [name] is a 87 y.o. female with a hx of paroxysmal supraventricular tachycardia, chronic right bundle branch block, hypertension, hyperlipidemia, CKD stage III, and breast cancer s/p lumpectomy . Last instance of PSVT 07/2020. PSVT dates to 2013 ([place]).  ***  Past Medical History:  Diagnosis Date  . Arthralgia   . Breast cancer ([place])    left breast   . Constipation   . Dyspepsia   . GERD (gastroesophageal reflux disease)   . High cholesterol   . Hypertension   . NSVT (nonsustained ventricular tachycardia) (HCC)    Short run of monomorphic VT in setting of hypokalemia during 09/2011 hospitalization. EF 60-65% by echo 09/26/11  . Right bundle branch block   . SVT (supraventricular tachycardia) ([place])    Noted 09/2011 responsive to cardizem  . Vertigo     Past Surgical History:  Procedure Laterality Date  . BREAST LUMPECTOMY     left breast cancer years ago  . CHOLECYSTECTOMY    . EYE SURGERY     1- eye - cataract removal   . TONSILLECTOMY      Current Medications: No outpatient medications have been marked as taking for the 12/15/20 encounter (Appointment) with Belva Crome, MD.     Allergies:   Cozaar [losartan]   Social History   Socioeconomic History  . Marital status: Widowed    Spouse name: Not on file  . Number of children: 4  . Years of education: Not on file  . Highest education level: Not on file  Occupational History  . Not on file  Tobacco Use  . Smoking status: Never Smoker  . Smokeless tobacco: Never Used  Vaping Use  . Vaping Use: Never used  Substance and Sexual Activity  . Alcohol use: No  . Drug use: No  . Sexual activity: Not Currently    Birth  control/protection: Post-menopausal  Other Topics Concern  . Not on file  Social History Narrative   5 children - 1 passed away,    Lives alone - has alert   Social Determinants of Health   Financial Resource Strain: Not on file  Food Insecurity: Not on file  Transportation Needs: Not on file  Physical Activity: Not on file  Stress: Not on file  Social Connections: Not on file     Family History: The patient's family history includes Hypertension in her father and mother; Tuberculosis in her father.  ROS:   Please see the history of present illness.    *** All other systems reviewed and are negative.  EKGs/Labs/Other Studies Reviewed:    The following studies were reviewed today: ***  EKG:  EKG ***  Recent Labs: 03/04/2020: TSH 1.567 05/26/2020: B Natriuretic Peptide 203.0 07/16/2020: Magnesium 1.9 09/23/2020: ALT 14; BUN 23; Creatinine, Ser 1.02; Hemoglobin 11.7; Platelets 146; Potassium 4.3; Sodium 142  Recent Lipid Panel    Component Value Date/Time   CHOL 152 09/23/2020 1334   TRIG 53 09/23/2020 1334   HDL 59 09/23/2020 1334   CHOLHDL 2.6 09/23/2020 1334   LDLCALC 82 09/23/2020 1334    Physical Exam:    VS:  There were no vitals  taken for this visit.    Wt Readings from Last 3 Encounters:  11/27/20 126 lb (57.2 kg)  09/23/20 126 lb 9.6 oz (57.4 kg)  08/14/20 131 lb 3.2 oz (59.5 kg)     GEN: ***. No acute distress HEENT: Normal NECK: No JVD. LYMPHATICS: No lymphadenopathy CARDIAC: *** murmur. RRR *** gallop, or edema. VASCULAR: *** Normal Pulses. No bruits. RESPIRATORY:  Clear to auscultation without rales, wheezing or rhonchi  ABDOMEN: Soft, non-tender, non-distended, No pulsatile mass, MUSCULOSKELETAL: No deformity  SKIN: Warm and dry NEUROLOGIC:  Alert and oriented x 3 PSYCHIATRIC:  Normal affect   ASSESSMENT:    1. PSVT (paroxysmal supraventricular tachycardia) ([place])   2. Right bundle branch block   3. Essential hypertension, benign   4.  Syncope and collapse    PLAN:    In order of problems listed above:  1. ***   Medication Adjustments/Labs and Tests Ordered: Current medicines are reviewed at length with the patient today.  Concerns regarding medicines are outlined above.  No orders of the defined types were placed in this encounter.  No orders of the defined types were placed in this encounter.   There are no Patient Instructions on file for this visit.   Signed, Sinclair Grooms, MD  12/14/2020 2:40 PM    [facility]

## 2020-12-15 ENCOUNTER — Ambulatory Visit: Payer: Medicare Other | Admitting: Interventional Cardiology

## 2020-12-15 DIAGNOSIS — R55 Syncope and collapse: Secondary | ICD-10-CM

## 2020-12-15 DIAGNOSIS — I10 Essential (primary) hypertension: Secondary | ICD-10-CM

## 2020-12-15 DIAGNOSIS — I471 Supraventricular tachycardia: Secondary | ICD-10-CM

## 2020-12-15 DIAGNOSIS — I4510 Unspecified right bundle-branch block: Secondary | ICD-10-CM

## 2021-01-21 ENCOUNTER — Ambulatory Visit (INDEPENDENT_AMBULATORY_CARE_PROVIDER_SITE_OTHER): Payer: Medicare Other | Admitting: Family Medicine

## 2021-01-21 ENCOUNTER — Other Ambulatory Visit: Payer: Self-pay

## 2021-01-21 ENCOUNTER — Encounter: Payer: Self-pay | Admitting: Family Medicine

## 2021-01-21 VITALS — BP 150/69 | HR 62 | Temp 97.3°F | Ht 63.0 in | Wt 121.8 lb

## 2021-01-21 DIAGNOSIS — I4510 Unspecified right bundle-branch block: Secondary | ICD-10-CM | POA: Diagnosis not present

## 2021-01-21 DIAGNOSIS — N1831 Chronic kidney disease, stage 3a: Secondary | ICD-10-CM

## 2021-01-21 DIAGNOSIS — R7303 Prediabetes: Secondary | ICD-10-CM

## 2021-01-21 DIAGNOSIS — D696 Thrombocytopenia, unspecified: Secondary | ICD-10-CM

## 2021-01-21 DIAGNOSIS — I10 Essential (primary) hypertension: Secondary | ICD-10-CM

## 2021-01-21 DIAGNOSIS — I471 Supraventricular tachycardia: Secondary | ICD-10-CM | POA: Diagnosis not present

## 2021-01-21 LAB — BAYER DCA HB A1C WAIVED: HB A1C (BAYER DCA - WAIVED): 6 % (ref <7.0)

## 2021-01-21 NOTE — Progress Notes (Signed)
Assessment & Plan:  1. Essential hypertension, benign Well controlled on current regimen.  - CMP14+EGFR - CBC with Differential/Platelet  2. Right bundle branch block Well controlled on current regimen.  Followed by cardiology.  3. Paroxysmal SVT (supraventricular tachycardia) (HCC) Well controlled on current regimen.   5. Prediabetes Previously well controlled without medication. Labs to assess today. - Bayer DCA Hb A1c Waived  8. Stage 3a chronic kidney disease ([place]) Labs to assess. - CMP14+EGFR  9. Thrombocytopenia ([place]) Labs to assess. - CBC with Differential/Platelet   Return in about 3 months (around 04/23/2021) for follow-up of chronic medication conditions.  Hendricks Limes, MSN, APRN, FNP-C [facility]  Subjective:    Patient ID: [name], female    DOB: 1919/11/27, 87 y.o.   MRN: [id number]  Patient Care Team: Loman Brooklyn, FNP as PCP - General (Family Medicine) Belva Crome, MD as PCP - Cardiology (Cardiology)   Chief Complaint:  Chief Complaint  Patient presents with   Hyperlipidemia   Hypertension    4 month follow up of chronic medical conditions     HPI: [name] is a 87 y.o. female presenting on 01/21/2021 for Hyperlipidemia and Hypertension (4 month follow up of chronic medical conditions )  Patient is accompanied by a caregiver who she is okay with being present.  She has no complaints or concerns today.   New complaints: None  Social history:  Relevant past medical, surgical, family and social history reviewed and updated as indicated. Interim medical history since our last visit reviewed.  Allergies and medications reviewed and updated.  DATA REVIEWED: CHART IN EPIC  ROS: Negative unless specifically indicated above in HPI.    Current Outpatient Medications:    amLODipine (NORVASC) 10 MG tablet, TAKE 1 TABLET BY MOUTH EVERY DAY IN THE MORNING, Disp: 90 tablet, Rfl: 3   aspirin EC 81  MG tablet, Take 81 mg by mouth daily., Disp: , Rfl:    esomeprazole (NEXIUM) 40 MG capsule, TAKE 1 CAPSULE BY MOUTH EVERY DAY, Disp: 90 capsule, Rfl: 1   fluticasone (FLONASE) 50 MCG/ACT nasal spray, INSTILL 2 SPRAYS IN EACH NOSTRIL ONCE A DAY (Patient taking differently: as needed.), Disp: 48 mL, Rfl: 6   KLOR-CON M20 20 MEQ tablet, TAKE 1 TABLET BY MOUTH EVERY DAY, Disp: 90 tablet, Rfl: 0   polyethylene glycol (MIRALAX / GLYCOLAX) 17 g packet, Take 17 g by mouth daily. , Disp: , Rfl:    Metoprolol Tartrate 75 MG TABS, Take 75 mg by mouth 2 (two) times daily., Disp: 180 tablet, Rfl: 1   Allergies  Allergen Reactions   Cozaar [Losartan] Swelling    Oral swelling    Past Medical History:  Diagnosis Date   Arthralgia    Breast cancer ([place])    left breast    Constipation    Dyspepsia    GERD (gastroesophageal reflux disease)    High cholesterol    Hypertension    NSVT (nonsustained ventricular tachycardia) (HCC)    Short run of monomorphic VT in setting of hypokalemia during 09/2011 hospitalization. EF 60-65% by echo 09/26/11   Right bundle branch block    SVT (supraventricular tachycardia) ([place])    Noted 09/2011 responsive to cardizem   Vertigo     Past Surgical History:  Procedure Laterality Date   BREAST LUMPECTOMY     left breast cancer years ago   CHOLECYSTECTOMY     EYE SURGERY     1- eye -  cataract removal    TONSILLECTOMY      Social History   Socioeconomic History   Marital status: Widowed    Spouse name: Not on file   Number of children: 4   Years of education: Not on file   Highest education level: Not on file  Occupational History   Not on file  Tobacco Use   Smoking status: Never   Smokeless tobacco: Never  Vaping Use   Vaping Use: Never used  Substance and Sexual Activity   Alcohol use: No   Drug use: No   Sexual activity: Not Currently    Birth control/protection: Post-menopausal  Other Topics Concern   Not on file  Social History Narrative   5  children - 1 passed away,    Lives alone - has alert   Social Determinants of Health   Financial Resource Strain: Not on file  Food Insecurity: Not on file  Transportation Needs: Not on file  Physical Activity: Not on file  Stress: Not on file  Social Connections: Not on file  Intimate Partner Violence: Not on file        Objective:    BP (!) 150/69   Pulse 62   Temp (!) 97.3 F (36.3 C) (Temporal)   Ht _0  (1.6 m)   Wt 121 lb 12.8 oz (55.2 kg)   SpO2 97%   BMI 21.58 kg/m   Wt Readings from Last 3 Encounters:  01/21/21 121 lb 12.8 oz (55.2 kg)  11/27/20 126 lb (57.2 kg)  09/23/20 126 lb 9.6 oz (57.4 kg)    Physical Exam Vitals reviewed.  Constitutional:      General: She is not in acute distress.    Appearance: Normal appearance. She is normal weight. She is not ill-appearing, toxic-appearing or diaphoretic.  HENT:     Head: Normocephalic and atraumatic.  Eyes:     General: No scleral icterus.       Right eye: No discharge.        Left eye: No discharge.     Conjunctiva/sclera: Conjunctivae normal.  Cardiovascular:     Rate and Rhythm: Normal rate and regular rhythm.     Heart sounds: Normal heart sounds. No murmur heard.   No friction rub. No gallop.  Pulmonary:     Effort: Pulmonary effort is normal. No respiratory distress.     Breath sounds: No stridor. No wheezing, rhonchi or rales.  Musculoskeletal:        General: Normal range of motion.     Cervical back: Normal range of motion.  Skin:    General: Skin is warm and dry.     Capillary Refill: Capillary refill takes less than 2 seconds.  Neurological:     General: No focal deficit present.     Mental Status: She is alert and oriented to person, place, and time. Mental status is at baseline.     Gait: Gait abnormal (walking with cane).  Psychiatric:        Mood and Affect: Mood normal.        Behavior: Behavior normal.        Thought Content: Thought content normal.        Judgment: Judgment  normal.    Lab Results  Component Value Date   TSH 1.567 03/04/2020   Lab Results  Component Value Date   WBC 4.3 09/23/2020   HGB 11.7 09/23/2020   HCT 36.9 09/23/2020   MCV 85 09/23/2020   PLT 146 (  L) 09/23/2020   Lab Results  Component Value Date   NA 142 09/23/2020   K 4.3 09/23/2020   CO2 25 09/23/2020   GLUCOSE 87 09/23/2020   BUN 23 09/23/2020   CREATININE 1.02 (H) 09/23/2020   BILITOT 0.4 09/23/2020   ALKPHOS 61 09/23/2020   AST 17 09/23/2020   ALT 14 09/23/2020   PROT 7.2 09/23/2020   ALBUMIN 4.2 09/23/2020   CALCIUM 9.4 09/23/2020   ANIONGAP 8 07/16/2020   Lab Results  Component Value Date   CHOL 152 09/23/2020   Lab Results  Component Value Date   HDL 59 09/23/2020   Lab Results  Component Value Date   LDLCALC 82 09/23/2020   Lab Results  Component Value Date   TRIG 53 09/23/2020   Lab Results  Component Value Date   CHOLHDL 2.6 09/23/2020   Lab Results  Component Value Date   HGBA1C 5.7 09/23/2020

## 2021-01-22 ENCOUNTER — Encounter: Payer: Self-pay | Admitting: Family Medicine

## 2021-01-22 LAB — CBC WITH DIFFERENTIAL/PLATELET
Basophils Absolute: 0 10*3/uL (ref 0.0–0.2)
Basos: 0 %
EOS (ABSOLUTE): 0 10*3/uL (ref 0.0–0.4)
Eos: 1 %
Hematocrit: 38 % (ref 34.0–46.6)
Hemoglobin: 12.5 g/dL (ref 11.1–15.9)
Immature Grans (Abs): 0 10*3/uL (ref 0.0–0.1)
Immature Granulocytes: 0 %
Lymphocytes Absolute: 1 10*3/uL (ref 0.7–3.1)
Lymphs: 31 %
MCH: 27.4 pg (ref 26.6–33.0)
MCHC: 32.9 g/dL (ref 31.5–35.7)
MCV: 83 fL (ref 79–97)
Monocytes Absolute: 0.3 10*3/uL (ref 0.1–0.9)
Monocytes: 10 %
Neutrophils Absolute: 2 10*3/uL (ref 1.4–7.0)
Neutrophils: 58 %
Platelets: 135 10*3/uL — ABNORMAL LOW (ref 150–450)
RBC: 4.56 x10E6/uL (ref 3.77–5.28)
RDW: 14.3 % (ref 11.7–15.4)
WBC: 3.4 10*3/uL (ref 3.4–10.8)

## 2021-01-22 LAB — CMP14+EGFR
ALT: 19 IU/L (ref 0–32)
AST: 18 IU/L (ref 0–40)
Albumin/Globulin Ratio: 1.4 (ref 1.2–2.2)
Albumin: 4.1 g/dL (ref 3.5–4.6)
Alkaline Phosphatase: 55 IU/L (ref 44–121)
BUN/Creatinine Ratio: 25 (ref 12–28)
BUN: 22 mg/dL (ref 10–36)
Bilirubin Total: 0.5 mg/dL (ref 0.0–1.2)
CO2: 23 mmol/L (ref 20–29)
Calcium: 9.1 mg/dL (ref 8.7–10.3)
Chloride: 101 mmol/L (ref 96–106)
Creatinine, Ser: 0.87 mg/dL (ref 0.57–1.00)
Globulin, Total: 3 g/dL (ref 1.5–4.5)
Glucose: 99 mg/dL (ref 65–99)
Potassium: 4.2 mmol/L (ref 3.5–5.2)
Sodium: 141 mmol/L (ref 134–144)
Total Protein: 7.1 g/dL (ref 6.0–8.5)
eGFR: 60 mL/min/{1.73_m2} (ref >59)

## 2021-01-23 ENCOUNTER — Encounter: Payer: Self-pay | Admitting: Family

## 2021-01-23 ENCOUNTER — Other Ambulatory Visit: Payer: Self-pay

## 2021-01-23 ENCOUNTER — Ambulatory Visit (INDEPENDENT_AMBULATORY_CARE_PROVIDER_SITE_OTHER): Payer: Medicare Other | Admitting: Family

## 2021-01-23 VITALS — BP 140/70 | HR 65 | Ht 63.0 in | Wt 124.0 lb

## 2021-01-23 DIAGNOSIS — E782 Mixed hyperlipidemia: Secondary | ICD-10-CM

## 2021-01-23 DIAGNOSIS — I4510 Unspecified right bundle-branch block: Secondary | ICD-10-CM | POA: Diagnosis not present

## 2021-01-23 DIAGNOSIS — I471 Supraventricular tachycardia: Secondary | ICD-10-CM

## 2021-01-23 DIAGNOSIS — I440 Atrioventricular block, first degree: Secondary | ICD-10-CM

## 2021-01-23 DIAGNOSIS — I10 Essential (primary) hypertension: Secondary | ICD-10-CM

## 2021-01-23 MED ORDER — METOPROLOL TARTRATE 75 MG PO TABS: 75.0000 mg | ORAL_TABLET | Freq: Two times a day (BID) | ORAL | 1 refills | Status: AC

## 2021-01-23 NOTE — Progress Notes (Signed)
Office Visit    Patient Name: [name] Date of Encounter: 01/23/2021  PCP:  Loman Brooklyn, [facility]  Cardiologist:  Sinclair Grooms, MD  Advanced Practice Provider:  No care team member to display Electrophysiologist:  None    Chief Complaint    [name] is a 87 y.o. female with a hx of paroxysmal SVT, chronic right bundle branch block, hypertension, hyperlipidemia, CKD 3, breast cancer s/p lumpectomy, prediabetes, thrombocytopenia presents today for follow up of PSVT   Past Medical History    Past Medical History:  Diagnosis Date   Arthralgia    Breast cancer ([place])    left breast    Constipation    Dyspepsia    GERD (gastroesophageal reflux disease)    High cholesterol    Hypertension    NSVT (nonsustained ventricular tachycardia) ([place])    Short run of monomorphic VT in setting of hypokalemia during 09/2011 hospitalization. EF 60-65% by echo 09/26/11   Right bundle branch block    SVT (supraventricular tachycardia) ([place])    Noted 09/2011 responsive to cardizem   Vertigo    Past Surgical History:  Procedure Laterality Date   BREAST LUMPECTOMY     left breast cancer years ago   CHOLECYSTECTOMY     EYE SURGERY     1- eye - cataract removal    TONSILLECTOMY      Allergies  Allergies  Allergen Reactions   Cozaar [Losartan] Swelling    Oral swelling     History of Present Illness    [name] is a 87 y.o. female with a hx of paroxysmal SVT, chronic right bundle branch block, hypertension, hyperlipidemia, CKD 3, breast cancer s/p lumpectomy, prediabetes, thrombocytopenia last seen 08/14/2020.  History of syncope August 2021.  Echo at that time with LVEF 60 to 25%, grade 1 diastolic dysfunction.  Follow-up monitor showed SVT which was thought to be the cause of syncope.  ED visit October 2021 for SVT treated with IV Lopressor with conversion back to normal sinus rhythm.  Admitted December 2021 for  vaginal bleeding and SVT.  She received 2 doses of adenosine and IV Lopressor with conversion to sinus rhythm.  Concern for atrial fibrillation on monitor.  Home beta-blocker dose was maintained.  She was seen in follow-up January 2022.  She was noted to live independently and had assistance from her daughter.  She reported no recurrent tachycardia.  Patient and her daughter politely declined PPM, ablation of SVT.  Vagal maneuver was discussed.  She was continued on metoprolol 75 mg twice daily as unable to uptitrate further given underlying trifascicular block.  Telemetry strips from the hospital reviewed with DOD with no evidence of atrial fibrillation.  She presents today for follow-up with her son. Tells me she has been doing well from a cardiac perspective. Enjoys playing solitaire in her spare time. No formal exercise routine. Endorses following a heart healthy diet. Reports no shortness of breath nor dyspnea on exertion. Reports no chest pain, pressure, or tightness. No edema, orthopnea, PND. Reports no palpitations. Checks BP at home intermittently and reports it is "good".   EKGs/Labs/Other Studies Reviewed:   The following studies were reviewed today:   EKG:  EKG is  ordered today.  The ekg ordered today demonstrates NSR with 1st degree AV block and RBBB stable compared to previous. No acute ST/T wave changes.   Recent Labs: 03/04/2020: TSH 1.567 05/26/2020: B Natriuretic  Peptide 203.0 07/16/2020: Magnesium 1.9 01/21/2021: ALT 19; BUN 22; Creatinine, Ser 0.87; Hemoglobin 12.5; Platelets 135; Potassium 4.2; Sodium 141  Recent Lipid Panel    Component Value Date/Time   CHOL 152 09/23/2020 1334   TRIG 53 09/23/2020 1334   HDL 59 09/23/2020 1334   CHOLHDL 2.6 09/23/2020 1334   LDLCALC 82 09/23/2020 1334    Home Medications   No outpatient medications have been marked as taking for the 01/23/21 encounter (Appointment) with Loel Dubonnet, NP.     Review of Systems    All other  systems reviewed and are otherwise negative except as noted above.  Physical Exam    VS:  There were no vitals taken for this visit. , BMI There is no height or weight on file to calculate BMI.  Wt Readings from Last 3 Encounters:  01/21/21 121 lb 12.8 oz (55.2 kg)  11/27/20 126 lb (57.2 kg)  09/23/20 126 lb 9.6 oz (57.4 kg)     GEN: Well nourished, well developed, in no acute distress. HEENT: normal. Neck: Supple, no JVD, carotid bruits, or masses. Cardiac: RRR, no murmurs, rubs, or gallops. No clubbing, cyanosis, edema.  Radials/PT 2+ and equal bilaterally.  Respiratory:  Respirations regular and unlabored, clear to auscultation bilaterally. GI: Soft, nontender, nondistended. MS: No deformity or atrophy. Skin: Warm and dry, no rash. Neuro:  Strength and sensation are intact. Psych: Normal affect.  Assessment & Plan    PSVT / Trifascicular block - 01/21/21 normal electrolytes. Reports no palpitations. EKG today NSR 1st degree AV block and RBBB - stable compared to previous. Continue Metoprolol Tartrate 75mg  BID. Refill provided. Will not further up titrate dose due to RBBB and as symptoms are well controlled. No indication for PPM at this time, patient and daughter have previously politely declined PPM/ablation.   HLD - 09/23/20 total cholesterol 152, LDL 82. Managed with diet. No indication for statin.  HTN - BP reasonably well controlled. Reports home readings well controlled. BP goal of <140/90 given age to avoid hypotension. Continue present antihypertensive regimen per PCP.   CKD3 - Careful titration of diuretic and antihypertensive.   Disposition: Follow up in 6 month(s) with Dr. Tamala Julian or APP   Signed, Loel Dubonnet, NP 01/23/2021, 12:45 PM [place]

## 2021-01-23 NOTE — Patient Instructions (Signed)
Medication Instructions:  Continue your current medications.   *If you need a refill on your cardiac medications before your next appointment, please call your pharmacy*   Lab Work: None ordered today.   Testing/Procedures: Your EKG today was stable compared to previous.   Follow-Up: At [facility], you and your health needs are our priority.  As part of our continuing mission to provide you with exceptional heart care, we have created designated Provider Care Teams.  These Care Teams include your primary Cardiologist (physician) and Advanced Practice Providers (APPs -  Physician Assistants and Nurse Practitioners) who all work together to provide you with the care you need, when you need it.  We recommend signing up for the patient portal called "MyChart".  Sign up information is provided on this After Visit Summary.  MyChart is used to connect with patients for Virtual Visits (Telemedicine).  Patients are able to view lab/test results, encounter notes, upcoming appointments, etc.  Non-urgent messages can be sent to your provider as well.   To learn more about what you can do with MyChart, go to [email or web address].    Your next appointment:   6 month(s)  The format for your next appointment:   In Person  Provider:   You may see Sinclair Grooms, MD or one of the following Advanced Practice Providers on your designated Care Team:   Kathyrn Drown, NP   Other Instructions  Heart Healthy Diet Recommendations: A low-salt diet is recommended. Meats should be grilled, baked, or boiled. Avoid fried foods. Focus on lean protein sources like fish or chicken with vegetables and fruits. The American Heart Association is a Microbiologist!  American Heart Association Diet and Lifeystyle Recommendations   Exercise recommendations: The American Heart Association recommends 150 minutes of moderate intensity exercise weekly. Try 30 minutes of moderate intensity exercise 4-5 times per  week. This could include walking, jogging, or swimming.

## 2021-03-01 ENCOUNTER — Encounter: Payer: Self-pay | Admitting: Obstetrics & Gynecology

## 2021-03-01 NOTE — Progress Notes (Signed)
Chief Complaint  Patient presents with   Pessary Check    Blood pressure 129/74, pulse (!) 59, weight 126 lb (57.2 kg).  [name] presents today for routine follow up related to her pessary.   She uses a Milex ring with suuprt #4 She reports no vaginal discharge and no vaginal bleeding   Likert scale(1 not bothersome -5 very bothersome)  :  1  Exam reveals no undue vaginal mucosal pressure of breakdown, no discharge and no vaginal bleeding.  Vaginal Epithelial Abnormality Classification System:   0 0    No abnormalities 1    Epithelial erythema 2    Granulation tissue 3    Epithelial break or erosion, 1 cm or less 4    Epithelial break or erosion, 1 cm or greater  The pessary is removed, cleaned and replaced without difficulty.    No diagnosis found.   [name] will be sen back in 4 months for continued follow up.  Florian Buff, MD  03/01/2021 6:37 PM

## 2021-03-30 ENCOUNTER — Ambulatory Visit: Payer: Medicare Other | Admitting: Obstetrics & Gynecology

## 2021-04-02 ENCOUNTER — Other Ambulatory Visit: Payer: Self-pay

## 2021-04-02 ENCOUNTER — Encounter: Payer: Self-pay | Admitting: Obstetrics & Gynecology

## 2021-04-02 ENCOUNTER — Ambulatory Visit (INDEPENDENT_AMBULATORY_CARE_PROVIDER_SITE_OTHER): Payer: Medicare Other | Admitting: Obstetrics & Gynecology

## 2021-04-02 VITALS — BP 166/82 | HR 73 | Ht 63.0 in

## 2021-04-02 DIAGNOSIS — Z4689 Encounter for fitting and adjustment of other specified devices: Secondary | ICD-10-CM

## 2021-04-02 NOTE — Progress Notes (Signed)
     GYN VISIT Patient name: [name] MRN [id number]  Date of birth: 1920/06/23 Chief Complaint:    Chief Complaint  Patient presents with   Pessary Check   History of Present Illness:   [name] is a 87 y.o. [id number] postmenopausal female being seen today for pessary maintenance.     Blood pressure (!) 166/82, pulse 73, height '5\' 3"'$  (1.6 m).  [name] presents today for routine follow up related to her pessary.   She uses a Milex ring with support #4 She reports no vaginal discharge and little vaginal bleeding   Likert scale(1 not bothersome -5 very bothersome)  :  1  Gen: No acute distress Abd: soft and non-tender GU: normal external genitialia- hypopigmented patches appreciated, narrowed introitus, pink flat mucosa, small epitheral erosion noted posterior vaginal wall with light spotting, no other abnormalities appreciated  Vaginal Epithelial Abnormality Classification System:   3 0    No abnormalities 1    Epithelial erythema 2    Granulation tissue 3    Epithelial break or erosion, 1 cm or less 4    Epithelial break or erosion, 1 cm or greater  The pessary is removed, cleaned and replaced without difficulty.      ICD-10-CM   1. Pessary maintenance, Milex ring with support #4, original fit 08/2015  Z46.89      PLAN:  -for now plan to continue with pessary -may consider "pessary vacation" for a few mos next visit depending on exam   [name] will be sen back in 3-4 months for continued follow up.  [name], DO  04/02/2021 10:35 AM

## 2021-04-12 ENCOUNTER — Other Ambulatory Visit: Payer: Self-pay | Admitting: Family Medicine

## 2021-04-12 DIAGNOSIS — K219 Gastro-esophageal reflux disease without esophagitis: Secondary | ICD-10-CM

## 2021-04-17 ENCOUNTER — Encounter: Payer: Self-pay | Admitting: Family Medicine

## 2021-04-17 ENCOUNTER — Other Ambulatory Visit: Payer: Self-pay

## 2021-04-17 ENCOUNTER — Ambulatory Visit (INDEPENDENT_AMBULATORY_CARE_PROVIDER_SITE_OTHER): Payer: Medicare Other | Admitting: Family Medicine

## 2021-04-17 VITALS — BP 173/73 | HR 87 | Temp 97.1°F | Ht 63.0 in | Wt 131.4 lb

## 2021-04-17 DIAGNOSIS — Z23 Encounter for immunization: Secondary | ICD-10-CM

## 2021-04-17 DIAGNOSIS — I10 Essential (primary) hypertension: Secondary | ICD-10-CM | POA: Diagnosis not present

## 2021-04-17 DIAGNOSIS — R600 Localized edema: Secondary | ICD-10-CM | POA: Diagnosis not present

## 2021-04-17 MED ORDER — HYDROCHLOROTHIAZIDE 12.5 MG PO CAPS
12.5000 mg | ORAL_CAPSULE | Freq: Every day | ORAL | 2 refills | Status: DC
Start: 2021-04-17 — End: 2021-04-23

## 2021-04-17 NOTE — Progress Notes (Signed)
Assessment & Plan:  1. Bilateral lower extremity edema Education provided on edema.  Started patient on hydrochlorothiazide for her blood pressure which should also help with her edema.  Encouraged her to decrease her salt intake. - BMP8+EGFR - TSH  2. Essential hypertension, benign Uncontrolled.  Started patient on hydrochlorothiazide. - BMP8+EGFR - hydrochlorothiazide (MICROZIDE) 12.5 MG capsule; Take 1 capsule (12.5 mg total) by mouth daily.  Dispense: 30 capsule; Refill: 2  3. Need for immunization against influenza - Flu Vaccine QUAD High Dose(Fluad) - given in office.   Return as scheduled.  Hendricks Limes, MSN, APRN, FNP-C [facility]  Subjective:    Patient ID: [name], female    DOB: 1919/10/14, 87 y.o.   MRN: [id number]  Patient Care Team: Loman Brooklyn, FNP as PCP - General (Family Medicine) Belva Crome, MD as PCP - Cardiology (Cardiology)   Chief Complaint:  Chief Complaint  Patient presents with   Foot Swelling    Bilateral x 1 month      HPI: [name] is a 87 y.o. female presenting on 04/17/2021 for Foot Swelling (Bilateral x 1 month /)  Patient is accompanied by her caregiver, who she is okay with being present.  Patient reports an increase in bilateral foot swelling for the past month.  She has been eating a lot more grits with a lot of salt in them.  Her blood pressures are uncontrolled at home.  She does not wear compression hose.    New complaints: None  Social history:  Relevant past medical, surgical, family and social history reviewed and updated as indicated. Interim medical history since our last visit reviewed.  Allergies and medications reviewed and updated.  DATA REVIEWED: CHART IN EPIC  ROS: Negative unless specifically indicated above in HPI.    Current Outpatient Medications:    amLODipine (NORVASC) 10 MG tablet, TAKE 1 TABLET BY MOUTH EVERY DAY IN THE MORNING, Disp: 90 tablet, Rfl:  3   aspirin EC 81 MG tablet, Take 81 mg by mouth daily., Disp: , Rfl:    esomeprazole (NEXIUM) 40 MG capsule, TAKE 1 CAPSULE BY MOUTH EVERY DAY, Disp: 90 capsule, Rfl: 0   fluticasone (FLONASE) 50 MCG/ACT nasal spray, INSTILL 2 SPRAYS IN EACH NOSTRIL ONCE A DAY, Disp: 48 mL, Rfl: 6   KLOR-CON M20 20 MEQ tablet, TAKE 1 TABLET BY MOUTH EVERY DAY, Disp: 90 tablet, Rfl: 0   polyethylene glycol (MIRALAX / GLYCOLAX) 17 g packet, Take 17 g by mouth daily. , Disp: , Rfl:    Metoprolol Tartrate 75 MG TABS, Take 75 mg by mouth 2 (two) times daily., Disp: 180 tablet, Rfl: 1   Allergies  Allergen Reactions   Cozaar [Losartan] Swelling    Oral swelling    Past Medical History:  Diagnosis Date   Arthralgia    Breast cancer ([place])    left breast    Constipation    Dyspepsia    GERD (gastroesophageal reflux disease)    High cholesterol    Hypertension    NSVT (nonsustained ventricular tachycardia) (HCC)    Short run of monomorphic VT in setting of hypokalemia during 09/2011 hospitalization. EF 60-65% by echo 09/26/11   Right bundle branch block    SVT (supraventricular tachycardia) ([place])    Noted 09/2011 responsive to cardizem   Vertigo     Past Surgical History:  Procedure Laterality Date   BREAST LUMPECTOMY     left breast cancer years ago  CHOLECYSTECTOMY     EYE SURGERY     1- eye - cataract removal    TONSILLECTOMY      Social History   Socioeconomic History   Marital status: Widowed    Spouse name: Not on file   Number of children: 4   Years of education: Not on file   Highest education level: Not on file  Occupational History   Not on file  Tobacco Use   Smoking status: Never   Smokeless tobacco: Never  Vaping Use   Vaping Use: Never used  Substance and Sexual Activity   Alcohol use: No   Drug use: No   Sexual activity: Not Currently    Birth control/protection: Post-menopausal  Other Topics Concern   Not on file  Social History Narrative   5 children - 1 passed  away,    Lives alone - has alert   Social Determinants of Health   Financial Resource Strain: Not on file  Food Insecurity: Not on file  Transportation Needs: Not on file  Physical Activity: Not on file  Stress: Not on file  Social Connections: Not on file  Intimate Partner Violence: Not on file        Objective:    BP (!) 173/73   Pulse 87   Temp (!) 97.1 F (36.2 C) (Temporal)   Ht _0  (1.6 m)   Wt 131 lb 6.4 oz (59.6 kg)   SpO2 100%   BMI 23.28 kg/m   Wt Readings from Last 3 Encounters:  04/17/21 131 lb 6.4 oz (59.6 kg)  01/23/21 124 lb (56.2 kg)  01/21/21 121 lb 12.8 oz (55.2 kg)    Physical Exam Vitals reviewed.  Constitutional:      General: She is not in acute distress.    Appearance: Normal appearance. She is not ill-appearing, toxic-appearing or diaphoretic.  HENT:     Head: Normocephalic and atraumatic.  Eyes:     General: No scleral icterus.       Right eye: No discharge.        Left eye: No discharge.     Conjunctiva/sclera: Conjunctivae normal.  Cardiovascular:     Rate and Rhythm: Normal rate and regular rhythm.     Heart sounds: Murmur heard.    No friction rub. No gallop.  Pulmonary:     Effort: Pulmonary effort is normal. No respiratory distress.     Breath sounds: Normal breath sounds. No stridor. No wheezing, rhonchi or rales.  Musculoskeletal:        General: Normal range of motion.     Cervical back: Normal range of motion.     Right lower leg: 2+ Edema present.     Left lower leg: 2+ Edema present.  Skin:    General: Skin is warm and dry.     Capillary Refill: Capillary refill takes less than 2 seconds.  Neurological:     General: No focal deficit present.     Mental Status: She is alert and oriented to person, place, and time. Mental status is at baseline.     Gait: Gait abnormal (ambulates with cane).  Psychiatric:        Mood and Affect: Mood normal.        Behavior: Behavior normal.        Thought Content: Thought content  normal.        Judgment: Judgment normal.    Lab Results  Component Value Date   TSH 1.567 03/04/2020  Lab Results  Component Value Date   WBC 3.4 01/21/2021   HGB 12.5 01/21/2021   HCT 38.0 01/21/2021   MCV 83 01/21/2021   PLT 135 (L) 01/21/2021   Lab Results  Component Value Date   NA 141 01/21/2021   K 4.2 01/21/2021   CO2 23 01/21/2021   GLUCOSE 99 01/21/2021   BUN 22 01/21/2021   CREATININE 0.87 01/21/2021   BILITOT 0.5 01/21/2021   ALKPHOS 55 01/21/2021   AST 18 01/21/2021   ALT 19 01/21/2021   PROT 7.1 01/21/2021   ALBUMIN 4.1 01/21/2021   CALCIUM 9.1 01/21/2021   ANIONGAP 8 07/16/2020   EGFR 60 01/21/2021   Lab Results  Component Value Date   CHOL 152 09/23/2020   Lab Results  Component Value Date   HDL 59 09/23/2020   Lab Results  Component Value Date   LDLCALC 82 09/23/2020   Lab Results  Component Value Date   TRIG 53 09/23/2020   Lab Results  Component Value Date   CHOLHDL 2.6 09/23/2020   Lab Results  Component Value Date   HGBA1C 6.0 01/21/2021

## 2021-04-18 LAB — BMP8+EGFR
BUN/Creatinine Ratio: 24 (ref 12–28)
BUN: 21 mg/dL (ref 10–36)
CO2: 26 mmol/L (ref 20–29)
Calcium: 9 mg/dL (ref 8.7–10.3)
Chloride: 97 mmol/L (ref 96–106)
Creatinine, Ser: 0.87 mg/dL (ref 0.57–1.00)
Glucose: 230 mg/dL — ABNORMAL HIGH (ref 65–99)
Potassium: 4.4 mmol/L (ref 3.5–5.2)
Sodium: 137 mmol/L (ref 134–144)
eGFR: 60 mL/min/{1.73_m2} (ref >59)

## 2021-04-18 LAB — TSH: TSH: 2.48 u[IU]/mL (ref 0.450–4.500)

## 2021-04-23 ENCOUNTER — Other Ambulatory Visit: Payer: Self-pay

## 2021-04-23 ENCOUNTER — Encounter: Payer: Self-pay | Admitting: Family Medicine

## 2021-04-23 ENCOUNTER — Ambulatory Visit (INDEPENDENT_AMBULATORY_CARE_PROVIDER_SITE_OTHER): Payer: Medicare Other | Admitting: Family Medicine

## 2021-04-23 VITALS — BP 168/80 | HR 78 | Temp 97.2°F | Ht 63.0 in | Wt 133.6 lb

## 2021-04-23 DIAGNOSIS — D696 Thrombocytopenia, unspecified: Secondary | ICD-10-CM

## 2021-04-23 DIAGNOSIS — I471 Supraventricular tachycardia: Secondary | ICD-10-CM | POA: Diagnosis not present

## 2021-04-23 DIAGNOSIS — I10 Essential (primary) hypertension: Secondary | ICD-10-CM | POA: Diagnosis not present

## 2021-04-23 DIAGNOSIS — R7303 Prediabetes: Secondary | ICD-10-CM

## 2021-04-23 DIAGNOSIS — K219 Gastro-esophageal reflux disease without esophagitis: Secondary | ICD-10-CM

## 2021-04-23 DIAGNOSIS — E876 Hypokalemia: Secondary | ICD-10-CM

## 2021-04-23 DIAGNOSIS — R600 Localized edema: Secondary | ICD-10-CM | POA: Diagnosis not present

## 2021-04-23 DIAGNOSIS — N1831 Chronic kidney disease, stage 3a: Secondary | ICD-10-CM | POA: Diagnosis not present

## 2021-04-23 MED ORDER — HYDROCHLOROTHIAZIDE 25 MG PO TABS: 25.0000 mg | ORAL_TABLET | Freq: Every day | ORAL | 1 refills | Status: AC

## 2021-04-23 NOTE — Progress Notes (Signed)
Assessment & Plan:  1. Bilateral lower extremity edema Uncontrolled. Decrease salt intake. Improve BP control with increase in HCTZ. Encouraged compression hose. - hydrochlorothiazide (HYDRODIURIL) 25 MG tablet; Take 1 tablet (25 mg total) by mouth daily.  Dispense: 90 tablet; Refill: 1  2. Essential hypertension, benign Uncontrolled. HCTZ increased to 25 mg QD. - hydrochlorothiazide (HYDRODIURIL) 25 MG tablet; Take 1 tablet (25 mg total) by mouth daily.  Dispense: 90 tablet; Refill: 1  3. Paroxysmal SVT (supraventricular tachycardia) (HCC) Well controlled on current regimen.   4. Stage 3a chronic kidney disease (HCC) Stable.  5. Prediabetes A1c 6.0 three months ago. Elevated glucose last week. Encouraged her to decrease sweets.  6. Hypokalemia Normal last week. Continue potassium supplement.  7. Gastroesophageal reflux disease without esophagitis Well controlled on current regimen.   8. Thrombocytopenia ([place]) Stable   Return in about 3 months (around 07/23/2021) for follow-up of chronic medication conditions.  Hendricks Limes, MSN, APRN, FNP-C [facility]  Subjective:    Patient ID: [name], female    DOB: Oct 26, 1919, 87 y.o.   MRN: [id number]  Patient Care Team: Loman Brooklyn, FNP as PCP - General (Family Medicine) Belva Crome, MD as PCP - Cardiology (Cardiology)   Chief Complaint:  Chief Complaint  Patient presents with   Hypertension   Hyperlipidemia    Check up of chronic medical conditions     HPI: [name] is a 87 y.o. female presenting on 04/23/2021 for Hypertension and Hyperlipidemia (Check up of chronic medical conditions )  Patient is accompanied by her caregiver, who she is okay with being present.  Edema: She has been eating a lot more grits with a lot of salt in them and has not decreased this despite being told to decrease salt intake to help her edema.  She does not wear compression hose.     Hypertension: occasionally checks BP at home and it is always elevated.   Prediabetes: eats a lot of cookies and ice cream.  Paroxysmal SVT: takes metoprolol twice daily.  GERD: takes Nexium daily.  New complaints: None  Social history:  Relevant past medical, surgical, family and social history reviewed and updated as indicated. Interim medical history since our last visit reviewed.  Allergies and medications reviewed and updated.  DATA REVIEWED: CHART IN EPIC  ROS: Negative unless specifically indicated above in HPI.    Current Outpatient Medications:    amLODipine (NORVASC) 10 MG tablet, TAKE 1 TABLET BY MOUTH EVERY DAY IN THE MORNING, Disp: 90 tablet, Rfl: 3   aspirin EC 81 MG tablet, Take 81 mg by mouth daily., Disp: , Rfl:    esomeprazole (NEXIUM) 40 MG capsule, TAKE 1 CAPSULE BY MOUTH EVERY DAY, Disp: 90 capsule, Rfl: 0   fluticasone (FLONASE) 50 MCG/ACT nasal spray, INSTILL 2 SPRAYS IN EACH NOSTRIL ONCE A DAY, Disp: 48 mL, Rfl: 6   hydrochlorothiazide (MICROZIDE) 12.5 MG capsule, Take 1 capsule (12.5 mg total) by mouth daily., Disp: 30 capsule, Rfl: 2   KLOR-CON M20 20 MEQ tablet, TAKE 1 TABLET BY MOUTH EVERY DAY, Disp: 90 tablet, Rfl: 0   polyethylene glycol (MIRALAX / GLYCOLAX) 17 g packet, Take 17 g by mouth daily. , Disp: , Rfl:    Metoprolol Tartrate 75 MG TABS, Take 75 mg by mouth 2 (two) times daily., Disp: 180 tablet, Rfl: 1   Allergies  Allergen Reactions   Cozaar [Losartan] Swelling    Oral swelling    Past Medical  History:  Diagnosis Date   Arthralgia    Breast cancer ([place])    left breast    Constipation    Dyspepsia    GERD (gastroesophageal reflux disease)    High cholesterol    Hypertension    NSVT (nonsustained ventricular tachycardia) (HCC)    Short run of monomorphic VT in setting of hypokalemia during 09/2011 hospitalization. EF 60-65% by echo 09/26/11   Right bundle branch block    SVT (supraventricular tachycardia) ([place])    Noted 09/2011  responsive to cardizem   Vertigo     Past Surgical History:  Procedure Laterality Date   BREAST LUMPECTOMY     left breast cancer years ago   CHOLECYSTECTOMY     EYE SURGERY     1- eye - cataract removal    TONSILLECTOMY      Social History   Socioeconomic History   Marital status: Widowed    Spouse name: Not on file   Number of children: 4   Years of education: Not on file   Highest education level: Not on file  Occupational History   Not on file  Tobacco Use   Smoking status: Never   Smokeless tobacco: Never  Vaping Use   Vaping Use: Never used  Substance and Sexual Activity   Alcohol use: No   Drug use: No   Sexual activity: Not Currently    Birth control/protection: Post-menopausal  Other Topics Concern   Not on file  Social History Narrative   5 children - 1 passed away,    Lives alone - has alert   Social Determinants of Health   Financial Resource Strain: Not on file  Food Insecurity: Not on file  Transportation Needs: Not on file  Physical Activity: Not on file  Stress: Not on file  Social Connections: Not on file  Intimate Partner Violence: Not on file        Objective:    BP (!) 168/80   Pulse 78   Temp (!) 97.2 F (36.2 C) (Temporal)   Ht _0  (1.6 m)   Wt 133 lb 9.6 oz (60.6 kg)   SpO2 92%   BMI 23.67 kg/m   Wt Readings from Last 3 Encounters:  04/23/21 133 lb 9.6 oz (60.6 kg)  04/17/21 131 lb 6.4 oz (59.6 kg)  01/23/21 124 lb (56.2 kg)    Physical Exam Vitals reviewed.  Constitutional:      General: She is not in acute distress.    Appearance: Normal appearance. She is not ill-appearing, toxic-appearing or diaphoretic.  HENT:     Head: Normocephalic and atraumatic.     Right Ear: Tympanic membrane, ear canal and external ear normal. There is no impacted cerumen.     Left Ear: Tympanic membrane, ear canal and external ear normal. There is no impacted cerumen.     Nose: Nose normal. No congestion or rhinorrhea.      Mouth/Throat:     Mouth: Mucous membranes are moist.     Pharynx: Oropharynx is clear. No oropharyngeal exudate or posterior oropharyngeal erythema.  Eyes:     General: No scleral icterus.       Right eye: No discharge.        Left eye: No discharge.     Conjunctiva/sclera: Conjunctivae normal.     Pupils: Pupils are equal, round, and reactive to light.  Cardiovascular:     Rate and Rhythm: Normal rate and regular rhythm.     Heart sounds: Murmur  heard.    No friction rub. No gallop.  Pulmonary:     Effort: Pulmonary effort is normal. No respiratory distress.     Breath sounds: Normal breath sounds. No stridor. No wheezing, rhonchi or rales.  Abdominal:     General: Abdomen is flat. Bowel sounds are normal. There is no distension.     Palpations: Abdomen is soft. There is no hepatomegaly, splenomegaly or mass.     Tenderness: There is no abdominal tenderness. There is no guarding or rebound.     Hernia: No hernia is present.  Musculoskeletal:        General: Normal range of motion.     Cervical back: Normal range of motion and neck supple. No rigidity. No muscular tenderness.     Right lower leg: 2+ Edema present.     Left lower leg: 2+ Edema present.  Lymphadenopathy:     Cervical: No cervical adenopathy.  Skin:    General: Skin is warm and dry.     Capillary Refill: Capillary refill takes less than 2 seconds.  Neurological:     General: No focal deficit present.     Mental Status: She is alert and oriented to person, place, and time. Mental status is at baseline.     Gait: Gait abnormal (ambulates with cane).  Psychiatric:        Mood and Affect: Mood normal.        Behavior: Behavior normal.        Thought Content: Thought content normal.        Judgment: Judgment normal.    Lab Results  Component Value Date   TSH 2.480 04/17/2021   Lab Results  Component Value Date   WBC 3.4 01/21/2021   HGB 12.5 01/21/2021   HCT 38.0 01/21/2021   MCV 83 01/21/2021   PLT 135  (L) 01/21/2021   Lab Results  Component Value Date   NA 137 04/17/2021   K 4.4 04/17/2021   CO2 26 04/17/2021   GLUCOSE 230 (H) 04/17/2021   BUN 21 04/17/2021   CREATININE 0.87 04/17/2021   BILITOT 0.5 01/21/2021   ALKPHOS 55 01/21/2021   AST 18 01/21/2021   ALT 19 01/21/2021   PROT 7.1 01/21/2021   ALBUMIN 4.1 01/21/2021   CALCIUM 9.0 04/17/2021   ANIONGAP 8 07/16/2020   EGFR 60 04/17/2021   Lab Results  Component Value Date   CHOL 152 09/23/2020   Lab Results  Component Value Date   HDL 59 09/23/2020   Lab Results  Component Value Date   LDLCALC 82 09/23/2020   Lab Results  Component Value Date   TRIG 53 09/23/2020   Lab Results  Component Value Date   CHOLHDL 2.6 09/23/2020   Lab Results  Component Value Date   HGBA1C 6.0 01/21/2021

## 2021-04-23 NOTE — Patient Instructions (Signed)
15-20 mmHg compression hose

## 2021-05-05 ENCOUNTER — Encounter (HOSPITAL_COMMUNITY): Payer: Self-pay | Admitting: Emergency Medicine

## 2021-05-05 ENCOUNTER — Emergency Department (HOSPITAL_COMMUNITY): Payer: Medicare Other

## 2021-05-05 ENCOUNTER — Emergency Department (HOSPITAL_COMMUNITY)
Admission: EM | Admit: 2021-05-05 | Discharge: 2021-05-05 | Disposition: A | Discharge status: Home / Self Care | Payer: Medicare Other | Attending: Emergency Medicine | Admitting: Emergency Medicine

## 2021-05-05 ENCOUNTER — Other Ambulatory Visit: Payer: Self-pay

## 2021-05-05 DIAGNOSIS — I129 Hypertensive chronic kidney disease with stage 1 through stage 4 chronic kidney disease, or unspecified chronic kidney disease: Secondary | ICD-10-CM | POA: Insufficient documentation

## 2021-05-05 DIAGNOSIS — Z79899 Other long term (current) drug therapy: Secondary | ICD-10-CM | POA: Insufficient documentation

## 2021-05-05 DIAGNOSIS — N1831 Chronic kidney disease, stage 3a: Secondary | ICD-10-CM | POA: Diagnosis not present

## 2021-05-05 DIAGNOSIS — R110 Nausea: Secondary | ICD-10-CM | POA: Diagnosis not present

## 2021-05-05 DIAGNOSIS — Z853 Personal history of malignant neoplasm of breast: Secondary | ICD-10-CM | POA: Diagnosis not present

## 2021-05-05 DIAGNOSIS — Z7982 Long term (current) use of aspirin: Secondary | ICD-10-CM | POA: Insufficient documentation

## 2021-05-05 DIAGNOSIS — R1084 Generalized abdominal pain: Secondary | ICD-10-CM | POA: Diagnosis not present

## 2021-05-05 LAB — URINALYSIS, ROUTINE W REFLEX MICROSCOPIC
Bilirubin Urine: NEGATIVE
Glucose, UA: NEGATIVE mg/dL
Ketones, ur: NEGATIVE mg/dL
Nitrite: NEGATIVE
Protein, ur: 100 mg/dL — AB
Specific Gravity, Urine: 1.01 (ref 1.005–1.030)
pH: 7 (ref 5.0–8.0)

## 2021-05-05 LAB — CBC
HCT: 41.5 % (ref 36.0–46.0)
Hemoglobin: 13.1 g/dL (ref 12.0–15.0)
MCH: 27.9 pg (ref 26.0–34.0)
MCHC: 31.6 g/dL (ref 30.0–36.0)
MCV: 88.3 fL (ref 80.0–100.0)
Platelets: 152 10*3/uL (ref 150–400)
RBC: 4.7 MIL/uL (ref 3.87–5.11)
RDW: 13.9 % (ref 11.5–15.5)
WBC: 3.6 10*3/uL — ABNORMAL LOW (ref 4.0–10.5)
nRBC: 0 % (ref 0.0–0.2)

## 2021-05-05 LAB — COMPREHENSIVE METABOLIC PANEL
ALT: 27 U/L (ref 0–44)
AST: 22 U/L (ref 15–41)
Albumin: 3.8 g/dL (ref 3.5–5.0)
Alkaline Phosphatase: 67 U/L (ref 38–126)
Anion gap: 8 (ref 5–15)
BUN: 19 mg/dL (ref 8–23)
CO2: 35 mmol/L — ABNORMAL HIGH (ref 22–32)
Calcium: 9.2 mg/dL (ref 8.9–10.3)
Chloride: 90 mmol/L — ABNORMAL LOW (ref 98–111)
Creatinine, Ser: 0.79 mg/dL (ref 0.44–1.00)
GFR, Estimated: >60 mL/min (ref >60)
Glucose, Bld: 121 mg/dL — ABNORMAL HIGH (ref 70–99)
Potassium: 3.5 mmol/L (ref 3.5–5.1)
Sodium: 133 mmol/L — ABNORMAL LOW (ref 135–145)
Total Bilirubin: 0.9 mg/dL (ref 0.3–1.2)
Total Protein: 7.5 g/dL (ref 6.5–8.1)

## 2021-05-05 LAB — LIPASE, BLOOD: Lipase: 27 U/L (ref 11–51)

## 2021-05-05 MED ORDER — SODIUM CHLORIDE 0.9 % IV BOLUS
1000.0000 mL | Freq: Once | INTRAVENOUS | Status: AC
  Administered 2021-05-05: 1000 mL via INTRAVENOUS

## 2021-05-05 MED ORDER — ONDANSETRON HCL 4 MG/2ML IJ SOLN
4.0000 mg | Freq: Once | INTRAMUSCULAR | Status: AC
  Administered 2021-05-05: 4 mg via INTRAVENOUS
  Filled 2021-05-05: qty 2

## 2021-05-05 MED ORDER — IOHEXOL 300 MG/ML  SOLN
100.0000 mL | Freq: Once | INTRAMUSCULAR | Status: AC | PRN
  Administered 2021-05-05: 100 mL via INTRAVENOUS

## 2021-05-05 MED ORDER — ONDANSETRON 4 MG PO TBDP: ORAL_TABLET | ORAL | 0 refills | Status: AC

## 2021-05-05 NOTE — ED Triage Notes (Signed)
Pt arrived by [facility] for nausea and abd pain that's been going on "for awhile".

## 2021-05-05 NOTE — Discharge Instructions (Signed)
Take Tylenol for pain.  We will also write you some Zofran for nausea.  And follow-up with your family doctor within a week for recheck of your urine culture and your stomach problems

## 2021-05-05 NOTE — ED Provider Notes (Signed)
[facility] Provider Note   CSN: [id number] Arrival date & time: 05/05/21  1497     History Chief Complaint  Patient presents with   Abdominal Pain    [name] is a 87 y.o. female.  Patient complains of some nausea and minor abdominal discomfort for many weeks.  Right now she is not having any pain  The history is provided by the patient, a relative and a significant other. No language interpreter was used.  Abdominal Pain Pain location:  Generalized Pain quality: aching   Pain radiates to:  Does not radiate Pain severity:  Mild Onset quality:  Gradual Timing:  Intermittent Progression:  Resolved Chronicity:  Recurrent Context: not alcohol use   Relieved by:  Nothing Worsened by:  Nothing Ineffective treatments:  None tried Associated symptoms: nausea   Associated symptoms: no chest pain, no cough, no diarrhea, no fatigue and no hematuria       Past Medical History:  Diagnosis Date   Arthralgia    Breast cancer ([place])    left breast    Constipation    Dyspepsia    GERD (gastroesophageal reflux disease)    High cholesterol    Hypertension    NSVT (nonsustained ventricular tachycardia)    Short run of monomorphic VT in setting of hypokalemia during 09/2011 hospitalization. EF 60-65% by echo 09/26/11   Right bundle branch block    SVT (supraventricular tachycardia) ([place])    Noted 09/2011 responsive to cardizem   Vertigo     Patient Active Problem List   Diagnosis Date Noted   Bilateral lower extremity edema 04/17/2021   Prediabetes 09/23/2020   Hypokalemia 09/23/2020   Paroxysmal SVT (supraventricular tachycardia) ([place]) 07/15/2020   Vaginal bleeding 07/15/2020   Thrombocytopenia ([place]) 07/15/2020   At high risk for falls 04/04/2020   Urge incontinence 04/04/2020   Stage 3a chronic kidney disease ([place]) 02/21/2020   Arthralgia of right hip 04/21/2015   GERD (gastroesophageal reflux disease) 11/26/2014   Hyperlipidemia 01/01/2014    Right bundle branch block 09/26/2011   Essential hypertension, benign 09/26/2011    Past Surgical History:  Procedure Laterality Date   BREAST LUMPECTOMY     left breast cancer years ago   CHOLECYSTECTOMY     EYE SURGERY     1- eye - cataract removal    TONSILLECTOMY       OB History     Gravida  5   Para  5   Term  5   Preterm      AB      Living  4      SAB      IAB      Ectopic      Multiple      Live Births  5           Family History  Problem Relation Age of Onset   Hypertension Mother    Tuberculosis Father    Hypertension Father     Social History   Tobacco Use   Smoking status: Never   Smokeless tobacco: Never  Vaping Use   Vaping Use: Never used  Substance Use Topics   Alcohol use: No   Drug use: No    Home Medications Prior to Admission medications   Medication Sig Start Date End Date Taking? Authorizing Provider  ondansetron (ZOFRAN ODT) 4 MG disintegrating tablet 4mg  ODT q4 hours prn nausea/vomit 05/05/21  Yes Milton Ferguson, MD  amLODipine (NORVASC) 10 MG tablet  TAKE 1 TABLET BY MOUTH EVERY DAY IN THE MORNING 06/25/20   Claretta Fraise, MD  aspirin EC 81 MG tablet Take 81 mg by mouth daily.    [provider]  esomeprazole (NEXIUM) 40 MG capsule TAKE 1 CAPSULE BY MOUTH EVERY DAY 04/13/21   Loman Brooklyn, FNP  fluticasone [facility]) 50 MCG/ACT nasal spray INSTILL 2 SPRAYS IN EACH NOSTRIL ONCE A DAY 11/19/19   Loman Brooklyn, FNP  hydrochlorothiazide (HYDRODIURIL) 25 MG tablet Take 1 tablet (25 mg total) by mouth daily. 04/23/21   Loman Brooklyn, FNP  KLOR-CON M20 20 MEQ tablet TAKE 1 TABLET BY MOUTH EVERY DAY 12/15/20   Loman Brooklyn, FNP  Metoprolol Tartrate 75 MG TABS Take 75 mg by mouth 2 (two) times daily. 01/23/21 02/22/21  Loel Dubonnet, NP  polyethylene glycol (MIRALAX / GLYCOLAX) 17 g packet Take 17 g by mouth daily.     [provider]    Allergies    Cozaar [losartan]  Review of Systems    Review of Systems  Constitutional:  Negative for appetite change and fatigue.  HENT:  Negative for congestion, ear discharge and sinus pressure.   Eyes:  Negative for discharge.  Respiratory:  Negative for cough.   Cardiovascular:  Negative for chest pain.  Gastrointestinal:  Positive for abdominal pain and nausea. Negative for diarrhea.  Genitourinary:  Negative for frequency and hematuria.  Musculoskeletal:  Negative for back pain.  Skin:  Negative for rash.  Neurological:  Negative for seizures and headaches.  Psychiatric/Behavioral:  Negative for hallucinations.    Physical Exam Updated Vital Signs BP (!) 173/77   Pulse 64   Temp 98.7 F (37.1 C) (Oral)   Resp 18   Ht 5\' 3"  (1.6 m)   Wt 60.6 kg   SpO2 94%   BMI 23.67 kg/m   Physical Exam Nursing note reviewed.  Constitutional:      Appearance: She is well-developed.  HENT:     Head: Normocephalic.     Mouth/Throat:     Mouth: Mucous membranes are moist.  Eyes:     General: No scleral icterus.    Conjunctiva/sclera: Conjunctivae normal.  Neck:     Thyroid: No thyromegaly.  Cardiovascular:     Rate and Rhythm: Normal rate and regular rhythm.     Heart sounds: No murmur heard.   No friction rub. No gallop.  Pulmonary:     Breath sounds: No stridor. No wheezing or rales.  Chest:     Chest wall: No tenderness.  Abdominal:     General: There is no distension.     Tenderness: There is no abdominal tenderness. There is no rebound.  Musculoskeletal:        General: Normal range of motion.     Cervical back: Neck supple.  Lymphadenopathy:     Cervical: No cervical adenopathy.  Skin:    Findings: No erythema or rash.  Neurological:     Mental Status: She is alert.     Motor: No abnormal muscle tone.     Coordination: Coordination normal.     Comments: Oriented to person only  Psychiatric:        Behavior: Behavior normal.    ED Results / Procedures / Treatments   Labs (all labs ordered are listed, but  only abnormal results are displayed) Labs Reviewed  COMPREHENSIVE METABOLIC PANEL - Abnormal; Notable for the following components:      Result Value   Sodium 133 (*)  Chloride 90 (*)    CO2 35 (*)    Glucose, Bld 121 (*)    All other components within normal limits  CBC - Abnormal; Notable for the following components:   WBC 3.6 (*)    All other components within normal limits  URINALYSIS, ROUTINE W REFLEX MICROSCOPIC - Abnormal; Notable for the following components:   Hgb urine dipstick SMALL (*)    Protein, ur 100 (*)    Leukocytes,Ua MODERATE (*)    Bacteria, UA RARE (*)    All other components within normal limits  URINE CULTURE  LIPASE, BLOOD    EKG None  Radiology CT ABDOMEN PELVIS W CONTRAST  Result Date: 05/05/2021 CLINICAL DATA:  Abdominal pain with nausea. EXAM: CT ABDOMEN AND PELVIS WITH CONTRAST TECHNIQUE: Multidetector CT imaging of the abdomen and pelvis was performed using the standard protocol following bolus administration of intravenous contrast. CONTRAST:  111mL OMNIPAQUE IOHEXOL 300 MG/ML  SOLN COMPARISON:  03/04/2020. FINDINGS: Lower chest: No acute abnormality. Hepatobiliary: No focal liver abnormality is seen. Status post cholecystectomy. No biliary dilatation. Pancreas: Unremarkable. No pancreatic ductal dilatation or surrounding inflammatory changes. Spleen: Normal in size without focal abnormality. Adrenals/Urinary Tract: No adrenal masses. Kidneys normal in position and orientation. Symmetric renal enhancement and excretion. Several subcentimeter low-density renal masses consistent with cysts. No stones. No hydronephrosis. Normal ureters. Normal bladder. Stomach/Bowel: Unremarkable stomach. Small bowel and colon are normal in caliber. No wall thickening. No inflammation. Normal appendix visualized. Vascular/Lymphatic: Aortic atherosclerosis. No enlarged lymph nodes. Reproductive: Round mass, 5.4 cm, containing dystrophic calcifications, arises in the  posterior mid to upper uterine segment. Is small amount of fluid and air within the endometrial canal. No adnexal masses. Pessary lies in the upper vagina. Other: No abdominal wall hernia or abnormality. No abdominopelvic ascites. Musculoskeletal: Moderate compression fracture of L1, increased in severity compared to the prior CT. No other fractures. No bone lesions. IMPRESSION: 1. Moderate compression fracture of L1, likely chronic, but increased in severity compared to the prior CT. Consider recent additional fracturing of this vertebra if there are symptoms of new back pain. 2. No definite acute abnormality within the abdomen or pelvis. No evidence of an abscess. 3. 5.4 cm uterine mass consistent with a submucosal fibroid, similar to the prior CT. 4. Aortic atherosclerosis. Electronically Signed   By: Lajean Manes M.D.   On: 05/05/2021 13:56    Procedures Procedures   Medications Ordered in ED Medications  sodium chloride 0.9 % bolus 1,000 mL (0 mLs Intravenous Stopped 05/05/21 1413)  ondansetron (ZOFRAN) injection 4 mg (4 mg Intravenous Given 05/05/21 1200)  iohexol (OMNIPAQUE) 300 MG/ML solution 100 mL (100 mLs Intravenous Contrast Given 05/05/21 1327)    ED Course  I have reviewed the triage vital signs and the nursing notes.  Pertinent labs & imaging results that were available during my care of the patient were reviewed by me and considered in my medical decision making (see chart for details).    MDM Rules/Calculators/A&P                           Patient with nausea and worsening compression fracture of L1.  She also has a questionable UTI.  We will culture her urine and she will get Tylenol for pain and Zofran for nausea and follow-up with her PCP within the week Final Clinical Impression(s) / ED Diagnoses Final diagnoses:  Nausea    Rx / DC Orders ED Discharge Orders  Ordered    ondansetron (ZOFRAN ODT) 4 MG disintegrating tablet        05/05/21 1441              Milton Ferguson, MD 05/09/21 1159

## 2021-05-06 LAB — URINE CULTURE

## 2021-05-10 ENCOUNTER — Other Ambulatory Visit: Payer: Self-pay | Admitting: Family Medicine

## 2021-05-10 DIAGNOSIS — E876 Hypokalemia: Secondary | ICD-10-CM

## 2021-05-11 ENCOUNTER — Telehealth: Payer: Self-pay | Admitting: Family Medicine

## 2021-05-11 NOTE — Telephone Encounter (Signed)
Appointment scheduled for 05/12/2021.

## 2021-05-11 NOTE — Telephone Encounter (Signed)
Spoke with son, he asks that antibiotic be sent in for his mother for abdominal pain, confusion and dysuria.  I explained to son that we do could not send in an antibiotic without an office visit and that I did not have any appointments available today.  The patient has a follow up scheduled with Hendricks Limes tomorrow at 4:25 but he asks that she be seen earlier tomorrow.  An appointment was scheduled with Dr. Warrick Parisian at 8:25 am.

## 2021-05-12 ENCOUNTER — Encounter: Payer: Self-pay | Admitting: Family Medicine

## 2021-05-12 ENCOUNTER — Ambulatory Visit: Payer: Medicare Other | Admitting: Family Medicine

## 2021-05-12 ENCOUNTER — Telehealth: Payer: Self-pay | Admitting: *Deleted

## 2021-05-12 ENCOUNTER — Ambulatory Visit (INDEPENDENT_AMBULATORY_CARE_PROVIDER_SITE_OTHER): Payer: Medicare Other | Admitting: Family Medicine

## 2021-05-12 ENCOUNTER — Other Ambulatory Visit: Payer: Self-pay

## 2021-05-12 VITALS — BP 143/75 | HR 86 | Ht 63.0 in | Wt 133.0 lb

## 2021-05-12 DIAGNOSIS — R399 Unspecified symptoms and signs involving the genitourinary system: Secondary | ICD-10-CM | POA: Diagnosis not present

## 2021-05-12 MED ORDER — CEPHALEXIN 500 MG PO CAPS: 500.0000 mg | ORAL_CAPSULE | Freq: Four times a day (QID) | ORAL | 0 refills | Status: AC

## 2021-05-12 NOTE — Progress Notes (Signed)
BP (!) 143/75   Pulse 86   Ht 5\' 3"  (1.6 m)   Wt 133 lb (60.3 kg)   BMI 23.56 kg/m    Subjective:   Patient ID: [name], female    DOB: [birth year], 87 y.o.   MRN: [id number]  HPI: [name] is a 87 y.o. female presenting on 05/12/2021 for Urinary Tract Infection and Altered Mental Status   HPI Urinary tract symptoms and altered mental status Patient is coming today with complaints of urinary tract symptoms and lower abdominal pain and burning and dysuria and dark urine that is been going on over the past week.  She is brought in by a friend of the family who is also a neighbor who helps take care of her sometimes and checks on her.  She says she has been more altered and not eating as well over the past 4 to 5 days.  She has been urinating less and drinking less and just not being herself.  She is more confused than she is normally although she does have an some amount of baseline normal confusion she says.  They are just worried because she has worsened over the past week or 2.  And especially worsened over the past 4 days  Relevant past medical, surgical, family and social history reviewed and updated as indicated. Interim medical history since our last visit reviewed. Allergies and medications reviewed and updated.  Review of Systems  Constitutional:  Negative for chills and fever.  Respiratory:  Negative for chest tightness and shortness of breath.   Cardiovascular:  Negative for chest pain and leg swelling.  Gastrointestinal:  Positive for abdominal pain.  Genitourinary:  Positive for dysuria, frequency, pelvic pain and urgency. Negative for difficulty urinating, hematuria, vaginal bleeding, vaginal discharge and vaginal pain.  Musculoskeletal:  Negative for back pain and gait problem.  Skin:  Negative for rash.  Neurological:  Negative for light-headedness and headaches.  Psychiatric/Behavioral:  Negative for agitation and behavioral problems.   All other  systems reviewed and are negative.  Per HPI unless specifically indicated above   Allergies as of 05/12/2021       Reactions   Cozaar [losartan] Swelling   Oral swelling         Medication List        Accurate as of May 12, 2021  9:19 AM. If you have any questions, ask your nurse or doctor.          amLODipine 10 MG tablet Commonly known as: NORVASC TAKE 1 TABLET BY MOUTH EVERY DAY IN THE MORNING   aspirin EC 81 MG tablet Take 81 mg by mouth daily.   esomeprazole 40 MG capsule Commonly known as: NEXIUM TAKE 1 CAPSULE BY MOUTH EVERY DAY   fluticasone 50 MCG/ACT nasal spray Commonly known as: FLONASE INSTILL 2 SPRAYS IN EACH NOSTRIL ONCE A DAY   hydrochlorothiazide 25 MG tablet Commonly known as: HYDRODIURIL Take 1 tablet (25 mg total) by mouth daily.   Klor-Con M20 20 MEQ tablet Generic drug: potassium chloride SA TAKE 1 TABLET BY MOUTH EVERY DAY   Metoprolol Tartrate 75 MG Tabs Take 75 mg by mouth 2 (two) times daily.   ondansetron 4 MG disintegrating tablet Commonly known as: Zofran ODT 4mg  ODT q4 hours prn nausea/vomit   polyethylene glycol 17 g packet Commonly known as: MIRALAX / GLYCOLAX Take 17 g by mouth daily.         Objective:   BP (!) 143/75  Pulse 86   Ht 5\' 3"  (1.6 m)   Wt 133 lb (60.3 kg)   BMI 23.56 kg/m   Wt Readings from Last 3 Encounters:  05/12/21 133 lb (60.3 kg)  05/05/21 133 lb 9.6 oz (60.6 kg)  04/23/21 133 lb 9.6 oz (60.6 kg)    Physical Exam Vitals and nursing note reviewed.  Constitutional:      General: She is not in acute distress.    Appearance: She is well-developed. She is not diaphoretic.  Eyes:     Conjunctiva/sclera: Conjunctivae normal.  Cardiovascular:     Rate and Rhythm: Normal rate and regular rhythm.     Heart sounds: Normal heart sounds. No murmur heard. Pulmonary:     Effort: Pulmonary effort is normal. No respiratory distress.     Breath sounds: Normal breath sounds. No wheezing.   Abdominal:     General: Abdomen is flat. Bowel sounds are normal. There is no distension.     Tenderness: There is abdominal tenderness in the suprapubic area. There is no right CVA tenderness, left CVA tenderness, guarding or rebound.  Musculoskeletal:        General: No tenderness. Normal range of motion.  Skin:    General: Skin is warm and dry.     Findings: No rash.  Neurological:     Mental Status: She is alert and oriented to person, place, and time.     Coordination: Coordination normal.  Psychiatric:        Behavior: Behavior normal.      Assessment & Plan:   Problem List Items Addressed This Visit   None Visit Diagnoses     UTI symptoms    -  Primary   Relevant Medications   cephALEXin (KEFLEX) 500 MG capsule   Other Relevant Orders   Urine Culture   Urinalysis       Patient is becoming more more sedentary and more confused and mostly sits in the chair did not have somebody to watch her constantly check on her.  She is at a stage where she is 24-hour supervision and they want a talk placement and getting an FL 2 form.  She is mostly in the wheelchair but is semiambulatory Follow up plan: Return if symptoms worsen or fail to improve.  Counseling provided for all of the vaccine components Orders Placed This Encounter  Procedures   Urine Culture   Urinalysis    Caryl Pina, MD [facility] 05/12/2021, 9:19 AM

## 2021-05-12 NOTE — Telephone Encounter (Signed)
Pt had a hard time swallowing the Keflex capsule, it took her about 20 minutes taking her first dose, did suggest that she could opened it up and put it in applesauce. She would like to know if we can go ahead and change it to the liquid form and send it into [facility] Please advise

## 2021-05-12 NOTE — Telephone Encounter (Signed)
Bertie informed to open capsule and put in applesauce. Asked to call back if needed.

## 2021-05-12 NOTE — Telephone Encounter (Signed)
Yes just have her open it up and put it in applesauce and will report any

## 2021-05-13 ENCOUNTER — Encounter (HOSPITAL_COMMUNITY): Payer: Self-pay | Admitting: *Deleted

## 2021-05-13 ENCOUNTER — Inpatient Hospital Stay (HOSPITAL_COMMUNITY)
Admission: EM | Admit: 2021-05-13 | Discharge: 2021-06-02 | DRG: 682 | Disposition: E | Payer: Medicare Other | Attending: Family Medicine | Admitting: Family Medicine

## 2021-05-13 ENCOUNTER — Telehealth: Payer: Self-pay | Admitting: Family Medicine

## 2021-05-13 ENCOUNTER — Other Ambulatory Visit: Payer: Self-pay

## 2021-05-13 ENCOUNTER — Emergency Department (HOSPITAL_COMMUNITY): Payer: Medicare Other

## 2021-05-13 DIAGNOSIS — D696 Thrombocytopenia, unspecified: Secondary | ICD-10-CM | POA: Diagnosis present

## 2021-05-13 DIAGNOSIS — I6340 Cerebral infarction due to embolism of unspecified cerebral artery: Secondary | ICD-10-CM | POA: Diagnosis not present

## 2021-05-13 DIAGNOSIS — I130 Hypertensive heart and chronic kidney disease with heart failure and stage 1 through stage 4 chronic kidney disease, or unspecified chronic kidney disease: Secondary | ICD-10-CM | POA: Diagnosis present

## 2021-05-13 DIAGNOSIS — Z7982 Long term (current) use of aspirin: Secondary | ICD-10-CM

## 2021-05-13 DIAGNOSIS — R627 Adult failure to thrive: Secondary | ICD-10-CM | POA: Diagnosis present

## 2021-05-13 DIAGNOSIS — I480 Paroxysmal atrial fibrillation: Secondary | ICD-10-CM | POA: Diagnosis present

## 2021-05-13 DIAGNOSIS — N1831 Chronic kidney disease, stage 3a: Secondary | ICD-10-CM | POA: Diagnosis present

## 2021-05-13 DIAGNOSIS — R1310 Dysphagia, unspecified: Secondary | ICD-10-CM | POA: Diagnosis present

## 2021-05-13 DIAGNOSIS — F0390 Unspecified dementia without behavioral disturbance: Secondary | ICD-10-CM | POA: Diagnosis present

## 2021-05-13 DIAGNOSIS — K5900 Constipation, unspecified: Secondary | ICD-10-CM | POA: Diagnosis present

## 2021-05-13 DIAGNOSIS — Z66 Do not resuscitate: Secondary | ICD-10-CM | POA: Diagnosis not present

## 2021-05-13 DIAGNOSIS — E870 Hyperosmolality and hypernatremia: Secondary | ICD-10-CM | POA: Diagnosis not present

## 2021-05-13 DIAGNOSIS — Z831 Family history of other infectious and parasitic diseases: Secondary | ICD-10-CM

## 2021-05-13 DIAGNOSIS — R7401 Elevation of levels of liver transaminase levels: Secondary | ICD-10-CM | POA: Diagnosis not present

## 2021-05-13 DIAGNOSIS — K921 Melena: Secondary | ICD-10-CM | POA: Diagnosis present

## 2021-05-13 DIAGNOSIS — I5032 Chronic diastolic (congestive) heart failure: Secondary | ICD-10-CM | POA: Diagnosis present

## 2021-05-13 DIAGNOSIS — K219 Gastro-esophageal reflux disease without esophagitis: Secondary | ICD-10-CM | POA: Diagnosis present

## 2021-05-13 DIAGNOSIS — Z23 Encounter for immunization: Secondary | ICD-10-CM

## 2021-05-13 DIAGNOSIS — N179 Acute kidney failure, unspecified: Secondary | ICD-10-CM | POA: Diagnosis present

## 2021-05-13 DIAGNOSIS — K922 Gastrointestinal hemorrhage, unspecified: Secondary | ICD-10-CM | POA: Diagnosis present

## 2021-05-13 DIAGNOSIS — Z20822 Contact with and (suspected) exposure to covid-19: Secondary | ICD-10-CM | POA: Diagnosis present

## 2021-05-13 DIAGNOSIS — D649 Anemia, unspecified: Secondary | ICD-10-CM

## 2021-05-13 DIAGNOSIS — J449 Chronic obstructive pulmonary disease, unspecified: Secondary | ICD-10-CM | POA: Diagnosis present

## 2021-05-13 DIAGNOSIS — Z515 Encounter for palliative care: Secondary | ICD-10-CM | POA: Diagnosis not present

## 2021-05-13 DIAGNOSIS — Z8249 Family history of ischemic heart disease and other diseases of the circulatory system: Secondary | ICD-10-CM | POA: Diagnosis not present

## 2021-05-13 DIAGNOSIS — I4510 Unspecified right bundle-branch block: Secondary | ICD-10-CM | POA: Diagnosis present

## 2021-05-13 DIAGNOSIS — G9341 Metabolic encephalopathy: Secondary | ICD-10-CM | POA: Diagnosis not present

## 2021-05-13 DIAGNOSIS — Z853 Personal history of malignant neoplasm of breast: Secondary | ICD-10-CM

## 2021-05-13 DIAGNOSIS — E7800 Pure hypercholesterolemia, unspecified: Secondary | ICD-10-CM | POA: Diagnosis present

## 2021-05-13 DIAGNOSIS — Z79899 Other long term (current) drug therapy: Secondary | ICD-10-CM

## 2021-05-13 DIAGNOSIS — Z888 Allergy status to other drugs, medicaments and biological substances status: Secondary | ICD-10-CM

## 2021-05-13 DIAGNOSIS — I639 Cerebral infarction, unspecified: Secondary | ICD-10-CM | POA: Clinically undetermined

## 2021-05-13 DIAGNOSIS — E872 Acidosis, unspecified: Secondary | ICD-10-CM | POA: Diagnosis present

## 2021-05-13 DIAGNOSIS — E860 Dehydration: Secondary | ICD-10-CM | POA: Diagnosis present

## 2021-05-13 DIAGNOSIS — Z7189 Other specified counseling: Secondary | ICD-10-CM | POA: Diagnosis not present

## 2021-05-13 LAB — CBC WITH DIFFERENTIAL/PLATELET
Abs Immature Granulocytes: 0.02 10*3/uL (ref 0.00–0.07)
Basophils Absolute: 0 10*3/uL (ref 0.0–0.1)
Basophils Relative: 0 %
Eosinophils Absolute: 0 10*3/uL (ref 0.0–0.5)
Eosinophils Relative: 0 %
HCT: 34.7 % — ABNORMAL LOW (ref 36.0–46.0)
Hemoglobin: 10.7 g/dL — ABNORMAL LOW (ref 12.0–15.0)
Immature Granulocytes: 0 %
Lymphocytes Relative: 8 %
Lymphs Abs: 0.5 10*3/uL — ABNORMAL LOW (ref 0.7–4.0)
MCH: 28.4 pg (ref 26.0–34.0)
MCHC: 30.8 g/dL (ref 30.0–36.0)
MCV: 92 fL (ref 80.0–100.0)
Monocytes Absolute: 0.5 10*3/uL (ref 0.1–1.0)
Monocytes Relative: 9 %
Neutro Abs: 4.8 10*3/uL (ref 1.7–7.7)
Neutrophils Relative %: 83 %
Platelets: 128 10*3/uL — ABNORMAL LOW (ref 150–400)
RBC: 3.77 MIL/uL — ABNORMAL LOW (ref 3.87–5.11)
RDW: 15.2 % (ref 11.5–15.5)
WBC: 5.8 10*3/uL (ref 4.0–10.5)
nRBC: 0.3 % — ABNORMAL HIGH (ref 0.0–0.2)

## 2021-05-13 LAB — BRAIN NATRIURETIC PEPTIDE: B Natriuretic Peptide: 1139 pg/mL — ABNORMAL HIGH (ref 0.0–100.0)

## 2021-05-13 LAB — LACTIC ACID, PLASMA
Lactic Acid, Venous: 1.7 mmol/L (ref 0.5–1.9)
Lactic Acid, Venous: 2.3 mmol/L (ref 0.5–1.9)

## 2021-05-13 LAB — BASIC METABOLIC PANEL
Anion gap: 9 (ref 5–15)
BUN: 85 mg/dL — ABNORMAL HIGH (ref 8–23)
CO2: 28 mmol/L (ref 22–32)
Calcium: 8.7 mg/dL — ABNORMAL LOW (ref 8.9–10.3)
Chloride: 103 mmol/L (ref 98–111)
Creatinine, Ser: 1.8 mg/dL — ABNORMAL HIGH (ref 0.44–1.00)
GFR, Estimated: 25 mL/min — ABNORMAL LOW (ref >60)
Glucose, Bld: 162 mg/dL — ABNORMAL HIGH (ref 70–99)
Potassium: 5.6 mmol/L — ABNORMAL HIGH (ref 3.5–5.1)
Sodium: 140 mmol/L (ref 135–145)

## 2021-05-13 LAB — TROPONIN I (HIGH SENSITIVITY)
Troponin I (High Sensitivity): 51 ng/L — ABNORMAL HIGH (ref <18)
Troponin I (High Sensitivity): 49 ng/L — ABNORMAL HIGH (ref <18)

## 2021-05-13 LAB — RESP PANEL BY RT-PCR (FLU A&B, COVID) ARPGX2
Influenza A by PCR: NEGATIVE
Influenza B by PCR: NEGATIVE
SARS Coronavirus 2 by RT PCR: NEGATIVE

## 2021-05-13 MED ORDER — SODIUM CHLORIDE 0.9 % IV BOLUS
1000.0000 mL | Freq: Once | INTRAVENOUS | Status: AC
  Administered 2021-05-13: 1000 mL via INTRAVENOUS

## 2021-05-13 MED ORDER — METHYLPREDNISOLONE SODIUM SUCC 125 MG IJ SOLR
60.0000 mg | Freq: Once | INTRAMUSCULAR | Status: AC
  Administered 2021-05-13: 60 mg via INTRAVENOUS
  Filled 2021-05-13: qty 2

## 2021-05-13 MED ORDER — ALBUTEROL SULFATE (2.5 MG/3ML) 0.083% IN NEBU
2.5000 mg | INHALATION_SOLUTION | Freq: Once | RESPIRATORY_TRACT | Status: AC
  Administered 2021-05-13: 2.5 mg via RESPIRATORY_TRACT
  Filled 2021-05-13: qty 3

## 2021-05-13 MED ORDER — IPRATROPIUM-ALBUTEROL 0.5-2.5 (3) MG/3ML IN SOLN
3.0000 mL | Freq: Once | RESPIRATORY_TRACT | Status: AC
  Administered 2021-05-13: 3 mL via RESPIRATORY_TRACT
  Filled 2021-05-13: qty 3

## 2021-05-13 NOTE — ED Triage Notes (Signed)
Pt brought in by [facility] with c/o SOB and weakness. EMS arrived and reports her O2 sat was in the upper 80s to lower 90s. Albuterol 2.5mg  neb given by EMS. Family reported to EMS that pt has been on a steady decline over the past week.

## 2021-05-13 NOTE — ED Notes (Signed)
POC occult blood negative

## 2021-05-13 NOTE — ED Provider Notes (Signed)
[facility] EMERGENCY DEPARTMENT Provider Note   CSN: [id number] Arrival date & time: 05/02/2021  1743     History Chief Complaint  Patient presents with   Shortness of Breath    [name] is a 87 y.o. female.  Patient brought in by family chief complaint of bloody stools.  They state that she has a caregiver that noticed bloody stools for the past 2 to 3 days.  Patient herself has no complaints of any pain or discomfort.  Family otherwise denies any complaints of vomiting or diarrhea.  EMS arrived and stated her O2 saturation was about 86 to 87% on room air which is low.        Past Medical History:  Diagnosis Date   Arthralgia    Breast cancer ([place])    left breast    Constipation    Dyspepsia    GERD (gastroesophageal reflux disease)    High cholesterol    Hypertension    NSVT (nonsustained ventricular tachycardia)    Short run of monomorphic VT in setting of hypokalemia during 09/2011 hospitalization. EF 60-65% by echo 09/26/11   Right bundle branch block    SVT (supraventricular tachycardia) ([place])    Noted 09/2011 responsive to cardizem   Vertigo     Patient Active Problem List   Diagnosis Date Noted   Bilateral lower extremity edema 04/17/2021   Prediabetes 09/23/2020   Hypokalemia 09/23/2020   Paroxysmal SVT (supraventricular tachycardia) ([place]) 07/15/2020   Vaginal bleeding 07/15/2020   Thrombocytopenia ([place]) 07/15/2020   At high risk for falls 04/04/2020   Urge incontinence 04/04/2020   Stage 3a chronic kidney disease ([place]) 02/21/2020   Arthralgia of right hip 04/21/2015   GERD (gastroesophageal reflux disease) 11/26/2014   Hyperlipidemia 01/01/2014   Right bundle branch block 09/26/2011   Essential hypertension, benign 09/26/2011    Past Surgical History:  Procedure Laterality Date   BREAST LUMPECTOMY     left breast cancer years ago   CHOLECYSTECTOMY     EYE SURGERY     1- eye - cataract removal    TONSILLECTOMY       OB History      Gravida  5   Para  5   Term  5   Preterm      AB      Living  4      SAB      IAB      Ectopic      Multiple      Live Births  5           Family History  Problem Relation Age of Onset   Hypertension Mother    Tuberculosis Father    Hypertension Father     Social History   Tobacco Use   Smoking status: Never   Smokeless tobacco: Never  Vaping Use   Vaping Use: Never used  Substance Use Topics   Alcohol use: No   Drug use: No    Home Medications Prior to Admission medications   Medication Sig Start Date End Date Taking? Authorizing Provider  amLODipine (NORVASC) 10 MG tablet TAKE 1 TABLET BY MOUTH EVERY DAY IN THE MORNING 06/25/20   Claretta Fraise, MD  aspirin EC 81 MG tablet Take 81 mg by mouth daily.    [provider]  cephALEXin (KEFLEX) 500 MG capsule Take 1 capsule (500 mg total) by mouth 4 (four) times daily. 05/12/21   Dettinger, Fransisca Kaufmann, MD  esomeprazole ([place]) 40  MG capsule TAKE 1 CAPSULE BY MOUTH EVERY DAY 04/13/21   Loman Brooklyn, FNP  fluticasone [facility]) 50 MCG/ACT nasal spray INSTILL 2 SPRAYS IN [facility] NOSTRIL ONCE A DAY 11/19/19   Loman Brooklyn, FNP  hydrochlorothiazide (HYDRODIURIL) 25 MG tablet Take 1 tablet (25 mg total) by mouth daily. 04/23/21   Loman Brooklyn, FNP  KLOR-CON M20 20 MEQ tablet TAKE 1 TABLET BY MOUTH EVERY DAY 05/11/21   Loman Brooklyn, FNP  Metoprolol Tartrate 75 MG TABS Take 75 mg by mouth 2 (two) times daily. 01/23/21 02/22/21  Loel Dubonnet, NP  ondansetron (ZOFRAN ODT) 4 MG disintegrating tablet 4mg  ODT q4 hours prn nausea/vomit 05/05/21   Milton Ferguson, MD  polyethylene glycol (MIRALAX / GLYCOLAX) 17 g packet Take 17 g by mouth daily.     [provider]    Allergies    Cozaar [losartan]  Review of Systems   Review of Systems  Constitutional:  Negative for fever.  HENT:  Negative for ear pain.   Eyes:  Negative for pain.  Respiratory:  Negative for cough.   Cardiovascular:   Negative for chest pain.  Gastrointestinal:  Negative for abdominal pain.  Genitourinary:  Negative for flank pain.  Musculoskeletal:  Negative for back pain.  Skin:  Negative for rash.  Neurological:  Negative for headaches.   Physical Exam Updated Vital Signs BP (!) 105/44   Pulse 72   Temp (!) 97.5 F (36.4 C) (Oral)   Resp (!) 22   Ht 5\' 3"  (1.6 m)   Wt 60.3 kg   SpO2 97%   BMI 23.56 kg/m   Physical Exam Constitutional:      General: She is not in acute distress.    Appearance: Normal appearance.  HENT:     Head: Normocephalic.     Nose: Nose normal.  Eyes:     Extraocular Movements: Extraocular movements intact.  Cardiovascular:     Rate and Rhythm: Normal rate.  Pulmonary:     Effort: Pulmonary effort is normal.     Breath sounds: Wheezing present.  Musculoskeletal:        General: Normal range of motion.     Cervical back: Normal range of motion.  Neurological:     General: No focal deficit present.     Mental Status: She is alert. Mental status is at baseline.    ED Results / Procedures / Treatments   Labs (all labs ordered are listed, but only abnormal results are displayed) Labs Reviewed  CBC WITH DIFFERENTIAL/PLATELET - Abnormal; Notable for the following components:      Result Value   RBC 3.77 (*)    Hemoglobin 10.7 (*)    HCT 34.7 (*)    Platelets 128 (*)    nRBC 0.3 (*)    Lymphs Abs 0.5 (*)    All other components within normal limits  BASIC METABOLIC PANEL - Abnormal; Notable for the following components:   Potassium 5.6 (*)    Glucose, Bld 162 (*)    BUN 85 (*)    Creatinine, Ser 1.80 (*)    Calcium 8.7 (*)    GFR, Estimated 25 (*)    All other components within normal limits  BRAIN NATRIURETIC PEPTIDE - Abnormal; Notable for the following components:   B Natriuretic Peptide 1,139.0 (*)    All other components within normal limits  LACTIC ACID, PLASMA - Abnormal; Notable for the following components:   Lactic Acid, Venous 2.3 (*)  All other components within normal limits  TROPONIN I (HIGH SENSITIVITY) - Abnormal; Notable for the following components:   Troponin I (High Sensitivity) 51 (*)    All other components within normal limits  TROPONIN I (HIGH SENSITIVITY) - Abnormal; Notable for the following components:   Troponin I (High Sensitivity) 49 (*)    All other components within normal limits  CULTURE, BLOOD (ROUTINE X 2)  CULTURE, BLOOD (ROUTINE X 2)  RESP PANEL BY RT-PCR (FLU A&B, COVID) ARPGX2  LACTIC ACID, PLASMA  POC OCCULT BLOOD, ED    EKG None  Radiology DG Chest Port 1 View  Result Date: 05/20/2021 CLINICAL DATA:  Short of breath, history of left breast cancer, hypoxia EXAM: PORTABLE CHEST 1 VIEW COMPARISON:  05/26/2020 FINDINGS: Single frontal view of the chest demonstrates an unremarkable cardiac silhouette. Atherosclerosis of the aortic arch. No acute airspace disease, effusion, or pneumothorax. No acute bony abnormalities. IMPRESSION: 1. No acute intrathoracic process. Electronically Signed   By: Randa Ngo M.D.   On: 05/24/2021 22:29    Procedures Procedures   Medications Ordered in ED Medications  ipratropium-albuterol (DUONEB) 0.5-2.5 (3) MG/3ML nebulizer solution 3 mL (3 mLs Nebulization Given 05/10/2021 1931)  methylPREDNISolone sodium succinate (SOLU-MEDROL) 125 mg/2 mL injection 60 mg (60 mg Intravenous Given 05/30/2021 1917)  albuterol (PROVENTIL) (2.5 MG/3ML) 0.083% nebulizer solution 2.5 mg (2.5 mg Nebulization Given 05/09/2021 2013)  sodium chloride 0.9 % bolus 1,000 mL (1,000 mLs Intravenous New Bag/Given 05/12/2021 2123)    ED Course  I have reviewed the triage vital signs and the nursing notes.  Pertinent labs & imaging results that were available during my care of the patient were reviewed by me and considered in my medical decision making (see chart for details).    MDM Rules/Calculators/A&P                           Saturation 97% on room air which is low.  Diffuse  wheezes heard on exam.  Patient given Solu-Medrol multiple breathing treatments with resolution of symptoms, now satting 92 to 95% on room air which is appropriate.  Rectal exam is benign appearing with guaiac was negative.  Brown appearing stool noted.  Labs that show mild hemoglobin drop from 13-10 etiology unclear.  She also appears to have AKI with creatinine nearly doubling.  Patient given liter bolus of fluids, will be admitted to the hospitalist team.  Final Clinical Impression(s) / ED Diagnoses Final diagnoses:  Anemia, unspecified type  AKI (acute kidney injury) [facility])    Rx / [place] Orders ED Discharge Orders     None        Luna Fuse, MD 05/23/2021 2234

## 2021-05-13 NOTE — Telephone Encounter (Signed)
Pts daughter Robet Leu) called to see if Dr Dettinger had filled out FL2 form that pt brought in with her yesterday when she came in for her appt.  Please advise and call patient or daughter Robet Leu [phone])

## 2021-05-14 ENCOUNTER — Ambulatory Visit: Payer: Medicare Other | Admitting: Family Medicine

## 2021-05-14 DIAGNOSIS — D649 Anemia, unspecified: Secondary | ICD-10-CM

## 2021-05-14 DIAGNOSIS — N179 Acute kidney failure, unspecified: Principal | ICD-10-CM

## 2021-05-14 DIAGNOSIS — K922 Gastrointestinal hemorrhage, unspecified: Secondary | ICD-10-CM

## 2021-05-14 DIAGNOSIS — E872 Acidosis, unspecified: Secondary | ICD-10-CM | POA: Diagnosis not present

## 2021-05-14 LAB — CBC
HCT: 39 % (ref 36.0–46.0)
Hemoglobin: 11.9 g/dL — ABNORMAL LOW (ref 12.0–15.0)
MCH: 28.2 pg (ref 26.0–34.0)
MCHC: 30.5 g/dL (ref 30.0–36.0)
MCV: 92.4 fL (ref 80.0–100.0)
Platelets: 125 10*3/uL — ABNORMAL LOW (ref 150–400)
RBC: 4.22 MIL/uL (ref 3.87–5.11)
RDW: 15.3 % (ref 11.5–15.5)
WBC: 4.2 10*3/uL (ref 4.0–10.5)
nRBC: 0 % (ref 0.0–0.2)

## 2021-05-14 LAB — COMPREHENSIVE METABOLIC PANEL
ALT: 218 U/L — ABNORMAL HIGH (ref 0–44)
AST: 113 U/L — ABNORMAL HIGH (ref 15–41)
Albumin: 3.5 g/dL (ref 3.5–5.0)
Alkaline Phosphatase: 209 U/L — ABNORMAL HIGH (ref 38–126)
Anion gap: 10 (ref 5–15)
BUN: 74 mg/dL — ABNORMAL HIGH (ref 8–23)
CO2: 24 mmol/L (ref 22–32)
Calcium: 8.6 mg/dL — ABNORMAL LOW (ref 8.9–10.3)
Chloride: 108 mmol/L (ref 98–111)
Creatinine, Ser: 1.42 mg/dL — ABNORMAL HIGH (ref 0.44–1.00)
GFR, Estimated: 33 mL/min — ABNORMAL LOW (ref >60)
Glucose, Bld: 161 mg/dL — ABNORMAL HIGH (ref 70–99)
Potassium: 5 mmol/L (ref 3.5–5.1)
Sodium: 142 mmol/L (ref 135–145)
Total Bilirubin: 0.8 mg/dL (ref 0.3–1.2)
Total Protein: 7.3 g/dL (ref 6.5–8.1)

## 2021-05-14 LAB — URINALYSIS, ROUTINE W REFLEX MICROSCOPIC
Bilirubin Urine: NEGATIVE
Glucose, UA: NEGATIVE mg/dL
Ketones, ur: NEGATIVE mg/dL
Nitrite: NEGATIVE
Protein, ur: 100 mg/dL — AB
RBC / HPF: >50 RBC/hpf — ABNORMAL HIGH (ref 0–5)
Specific Gravity, Urine: 1.015 (ref 1.005–1.030)
pH: 5 (ref 5.0–8.0)

## 2021-05-14 LAB — MAGNESIUM: Magnesium: 2.7 mg/dL — ABNORMAL HIGH (ref 1.7–2.4)

## 2021-05-14 LAB — LACTIC ACID, PLASMA
Lactic Acid, Venous: 2.3 mmol/L (ref 0.5–1.9)
Lactic Acid, Venous: 1.8 mmol/L (ref 0.5–1.9)

## 2021-05-14 LAB — TSH: TSH: 1.445 u[IU]/mL (ref 0.350–4.500)

## 2021-05-14 LAB — TYPE AND SCREEN
ABO/RH(D): A POS
Antibody Screen: NEGATIVE

## 2021-05-14 LAB — POC OCCULT BLOOD, ED: Fecal Occult Bld: NEGATIVE

## 2021-05-14 MED ORDER — AMLODIPINE BESYLATE 5 MG PO TABS
5.0000 mg | ORAL_TABLET | Freq: Every day | ORAL | Status: DC
  Administered 2021-05-14: 5 mg via ORAL
  Filled 2021-05-14: qty 1

## 2021-05-14 MED ORDER — HYDROCHLOROTHIAZIDE 25 MG PO TABS: 25.0000 mg | ORAL_TABLET | Freq: Every day | ORAL | Status: DC

## 2021-05-14 MED ORDER — ALBUTEROL SULFATE (2.5 MG/3ML) 0.083% IN NEBU: 2.5000 mg | INHALATION_SOLUTION | RESPIRATORY_TRACT | Status: DC | PRN

## 2021-05-14 MED ORDER — AMLODIPINE BESYLATE 5 MG PO TABS: 10.0000 mg | ORAL_TABLET | Freq: Every day | ORAL | Status: DC

## 2021-05-14 MED ORDER — METHYLPREDNISOLONE SODIUM SUCC 125 MG IJ SOLR: 125.0000 mg | Freq: Every day | INTRAMUSCULAR | Status: DC

## 2021-05-14 MED ORDER — METOPROLOL TARTRATE 75 MG PO TABS: 75.0000 mg | ORAL_TABLET | Freq: Two times a day (BID) | ORAL | Status: DC

## 2021-05-14 MED ORDER — METOPROLOL TARTRATE 50 MG PO TABS
50.0000 mg | ORAL_TABLET | Freq: Two times a day (BID) | ORAL | Status: DC
  Administered 2021-05-14 – 2021-05-15 (×3): 50 mg via ORAL
  Filled 2021-05-14 (×7): qty 1

## 2021-05-14 MED ORDER — ASPIRIN EC 81 MG PO TBEC
81.0000 mg | DELAYED_RELEASE_TABLET | Freq: Every day | ORAL | Status: DC
  Administered 2021-05-14 – 2021-05-15 (×2): 81 mg via ORAL
  Filled 2021-05-14 (×2): qty 1

## 2021-05-14 MED ORDER — INFLUENZA VAC A&B SA ADJ QUAD 0.5 ML IM PRSY
0.5000 mL | PREFILLED_SYRINGE | INTRAMUSCULAR | Status: AC
  Administered 2021-05-15: 0.5 mL via INTRAMUSCULAR
  Filled 2021-05-14: qty 0.5

## 2021-05-14 MED ORDER — ACETAMINOPHEN 325 MG PO TABS
650.0000 mg | ORAL_TABLET | Freq: Four times a day (QID) | ORAL | Status: DC | PRN
  Administered 2021-05-14: 650 mg via ORAL
  Filled 2021-05-14: qty 2

## 2021-05-14 MED ORDER — ACETAMINOPHEN 650 MG RE SUPP: 650.0000 mg | Freq: Four times a day (QID) | RECTAL | Status: DC | PRN

## 2021-05-14 MED ORDER — SODIUM CHLORIDE 0.9 % IV SOLN: INTRAVENOUS | Status: DC

## 2021-05-14 MED ORDER — ONDANSETRON HCL 4 MG/2ML IJ SOLN
4.0000 mg | Freq: Four times a day (QID) | INTRAMUSCULAR | Status: DC | PRN
  Administered 2021-05-14: 4 mg via INTRAVENOUS
  Filled 2021-05-14: qty 2

## 2021-05-14 MED ORDER — ONDANSETRON HCL 4 MG PO TABS: 4.0000 mg | ORAL_TABLET | Freq: Four times a day (QID) | ORAL | Status: DC | PRN

## 2021-05-14 NOTE — Evaluation (Signed)
Physical Therapy Evaluation Patient Details Name: [name] MRN: [id number] DOB: 06/05/1920 Today's Date: 05/14/2021  History of Present Illness  [name]  is a 87 y.o. female, with history of breast cancer, GERD, hyperlipidemia, hypertension, SVT, vertigo, and more presents ED with a chief complaint of rectal bleeding versus wheezing.  According to triage patient was brought in via EMS for shortness of breath and weakness.  When EMS arrived to her her O2 sats were in the 74s and 90s.  She was given albuterol neb in route.  Family had reported to the EMS the patient has had a steady decline over the past week.  The ED provider spoke with family they reported that the caregiver noticed bloody stools for the past 2 or 3 days.  Patient herself has had no complaints.  Unfortunately patient is not able to give any history secondary to dementia.  In the ED she was found to be afebrile with stable heart rate, intermittent tachypnea up to 25, blood pressure stable 118/48, satting at 95% on 2.5 L nasal cannula  No leukocytosis with white blood cell count 5.8, hemoglobin 10.7  Slightly elevated potassium of 5.6, AKI is revealed with a creatinine of 1.8, baseline 0.8  BNP is significantly elevated at 1139, prior was in the 200s  Troponins are 51 and 49  Lactic acid is 2.3, but improved to 1.8 after 1 L of fluid   Clinical Impression  Patient demonstrates slow labored movement for sitting up at bedside requiring Mod assist due to generalized weakness with difficulty moving BLE, limited to a few side steps limited mostly due to fatigue, buckling of knees and fall risk.  Patient put back to bed after therapy.  Patient will benefit from continued physical therapy in hospital and recommended venue below to increase strength, balance, endurance for safe ADLs and gait.      Recommendations for follow up therapy are one component of a multi-disciplinary discharge planning process, led by the attending  physician.  Recommendations may be updated based on patient status, additional functional criteria and insurance authorization.  Follow Up Recommendations SNF    Equipment Recommendations  None recommended by PT;Rolling walker with 5" wheels    Recommendations for Other Services       Precautions / Restrictions Precautions Precautions: Fall Restrictions Weight Bearing Restrictions: No      Mobility  Bed Mobility Overal bed mobility: Needs Assistance Bed Mobility: Supine to Sit;Sit to Supine     Supine to sit: Mod assist Sit to supine: Mod assist   General bed mobility comments: increased time, labored movement    Transfers Overall transfer level: Needs assistance Equipment used: Rolling walker (2 wheeled) Transfers: Sit to/from Stand Sit to Stand: Mod assist         General transfer comment: increased time, labored movement  Ambulation/Gait Ambulation/Gait assistance: Mod assist;Max assist Gait Distance (Feet): 4 Feet Assistive device: Rolling walker (2 wheeled) Gait Pattern/deviations: Decreased step length - right;Decreased step length - left;Decreased stride length Gait velocity: decreased   General Gait Details: limited to a few slow labored side steps before having to sit due to fatigue and poor standing balance  Stairs            Wheelchair Mobility    Modified Rankin (Stroke Patients Only)       Balance Overall balance assessment: Needs assistance Sitting-balance support: Feet supported;No upper extremity supported Sitting balance-Leahy Scale: Fair Sitting balance - Comments: seated at EOB   Standing balance support:  During functional activity;Bilateral upper extremity supported Standing balance-Leahy Scale: Poor Standing balance comment: using RW                             Pertinent Vitals/Pain Pain Assessment: No/denies pain    Home Living Family/patient expects to be discharged to:: Private residence Living  Arrangements: Alone Available Help at Discharge: Family;Available PRN/intermittently Type of Home: House Home Access: Stairs to enter Entrance Stairs-Rails: Left Entrance Stairs-Number of Steps: 2-3 into carport Home Layout: One level Home Equipment: Shower seat;Cane - quad      Prior Function Level of Independence: Needs assistance   Gait / Transfers Assistance Needed: household ambulator using quad-cane  ADL's / Homemaking Assistance Needed: has home aide 2-3 hours/day x 2-3 days/week, family also assist  Comments: infor taken from previous admission due to poor historian     Hand Dominance        Extremity/Trunk Assessment   Upper Extremity Assessment Upper Extremity Assessment: Generalized weakness    Lower Extremity Assessment Lower Extremity Assessment: Generalized weakness    Cervical / Trunk Assessment Cervical / Trunk Assessment: Normal  Communication   Communication: No difficulties  Cognition Arousal/Alertness: Awake/alert Behavior During Therapy: WFL for tasks assessed/performed Overall Cognitive Status: Within Functional Limits for tasks assessed                                        General Comments      Exercises     Assessment/Plan    PT Assessment Patient needs continued PT services  PT Problem List Decreased strength;Decreased activity tolerance;Decreased balance;Decreased mobility       PT Treatment Interventions DME instruction;Gait training;Stair training;Functional mobility training;Therapeutic activities;Therapeutic exercise;Balance training;Patient/family education    PT Goals (Current goals can be found in the Care Plan section)  Acute Rehab PT Goals Patient Stated Goal: return home PT Goal Formulation: With patient Time For Goal Achievement: 05/28/21 Potential to Achieve Goals: Good    Frequency Min 3X/week   Barriers to discharge        Co-evaluation               AM-PAC PT "6 Clicks" Mobility   Outcome Measure Help needed turning from your back to your side while in a flat bed without using bedrails?: A Lot Help needed moving from lying on your back to sitting on the side of a flat bed without using bedrails?: A Lot Help needed moving to and from a bed to a chair (including a wheelchair)?: A Lot Help needed standing up from a chair using your arms (e.g., wheelchair or bedside chair)?: A Lot Help needed to walk in hospital room?: A Lot Help needed climbing 3-5 steps with a railing? : Total 6 Click Score: 11    End of Session   Activity Tolerance: Patient tolerated treatment well;Patient limited by fatigue Patient left: in bed;with call bell/phone within reach Nurse Communication: Mobility status PT Visit Diagnosis: Unsteadiness on feet (R26.81);Other abnormalities of gait and mobility (R26.89);Muscle weakness (generalized) (M62.81)    Time: 5852-7782 PT Time Calculation (min) (ACUTE ONLY): 20 min   Charges:   PT Evaluation $PT Eval Moderate Complexity: 1 Mod PT Treatments $Therapeutic Activity: 8-22 mins        1:55 PM, 05/14/21 Lonell Grandchild, MPT Physical Therapist with [facility] [phone] office [phone] mobile phone

## 2021-05-14 NOTE — Plan of Care (Signed)
  Problem: Acute Rehab PT Goals(only PT should resolve) Goal: Pt Will Go Supine/Side To Sit Outcome: Progressing Flowsheets (Taken 05/14/2021 1400) Pt will go Supine/Side to Sit:  with min guard assist  with minimal assist Goal: Patient Will Transfer Sit To/From Stand Outcome: Progressing Flowsheets (Taken 05/14/2021 1400) Patient will transfer sit to/from stand: with minimal assist Goal: Pt Will Transfer Bed To Chair/Chair To Bed Outcome: Progressing Flowsheets (Taken 05/14/2021 1400) Pt will Transfer Bed to Chair/Chair to Bed: with min assist Goal: Pt Will Ambulate Outcome: Progressing Flowsheets (Taken 05/14/2021 1400) Pt will Ambulate:  25 feet  with minimal assist  with rolling walker  with least restrictive assistive device   2:01 PM, 05/14/21 Lonell Grandchild, MPT Physical Therapist with [facility] [phone] office [phone] mobile phone

## 2021-05-14 NOTE — ED Notes (Signed)
Pts. Heart rate was in the 140's contacted DO Zierle-Ghosh. Pts. Heart rate is currently 105.

## 2021-05-14 NOTE — H&P (Signed)
TRH H&P    Patient Demographics:    [name], is a 87 y.o. female  MRN: [id number]  DOB - 01-07-1920  Admit Date - 05/07/2021  Referring MD/NP/PA: Almyra Free  Outpatient Primary MD for the patient is Loman Brooklyn, FNP  Patient coming from: Home  Chief complaint-wheezing, rectal bleeding   HPI:    [name]  is a 87 y.o. female, with history of breast cancer, GERD, hyperlipidemia, hypertension, SVT, vertigo, and more presents ED with a chief complaint of rectal bleeding versus wheezing.  According to triage patient was brought in via EMS for shortness of breath and weakness.  When EMS arrived to her her O2 sats were in the 73s and 90s.  She was given albuterol neb in route.  Family had reported to the EMS the patient has had a steady decline over the past week.  The ED provider spoke with family they reported that the caregiver noticed bloody stools for the past 2 or 3 days.  Patient herself has had no complaints.  Unfortunately patient is not able to give any history secondary to dementia.  In the ED she was found to be afebrile with stable heart rate, intermittent tachypnea up to 25, blood pressure stable 118/48, satting at 95% on 2.5 L nasal cannula No leukocytosis with white blood cell count 5.8, hemoglobin 10.7 Slightly elevated potassium of 5.6, AKI is revealed with a creatinine of 1.8, baseline 0.8 BNP is significantly elevated at 1139, prior was in the 200s Troponins are 51 and 49 Lactic acid is 2.3, but improved to 1.8 after 1 L of fluid  EKG shows wide-complex tachycardia with a rate of 138, QTc 485, Chest x-ray shows no acute intrathoracic process Patient was given albuterol, DuoNeb, Solu-Medrol, 1 L normal saline.  Patient had negative respiratory panel. Admission requested for lactic acidosis and acute respiratory failure with hypoxia as well as AKI    Review of systems:     Unfortunately review of systems could not be obtained secondary to patient's dementia    Past History of the following :    Past Medical History:  Diagnosis Date   Arthralgia    Breast cancer ([place])    left breast    Constipation    Dyspepsia    GERD (gastroesophageal reflux disease)    High cholesterol    Hypertension    NSVT (nonsustained ventricular tachycardia)    Short run of monomorphic VT in setting of hypokalemia during 09/2011 hospitalization. EF 60-65% by echo 09/26/11   Right bundle branch block    SVT (supraventricular tachycardia) ([place])    Noted 09/2011 responsive to cardizem   Vertigo       Past Surgical History:  Procedure Laterality Date   BREAST LUMPECTOMY     left breast cancer years ago   CHOLECYSTECTOMY     EYE SURGERY     1- eye - cataract removal    TONSILLECTOMY        Social History:      Social History   Tobacco Use  Smoking status: Never   Smokeless tobacco: Never  Substance Use Topics   Alcohol use: No       Family History :     Family History  Problem Relation Age of Onset   Hypertension Mother    Tuberculosis Father    Hypertension Father       Home Medications:   Prior to Admission medications   Medication Sig Start Date End Date Taking? Authorizing Provider  amLODipine (NORVASC) 10 MG tablet TAKE 1 TABLET BY MOUTH EVERY DAY IN THE MORNING 06/25/20   Claretta Fraise, MD  aspirin EC 81 MG tablet Take 81 mg by mouth daily.    [provider]  cephALEXin (KEFLEX) 500 MG capsule Take 1 capsule (500 mg total) by mouth 4 (four) times daily. 05/12/21   Dettinger, Fransisca Kaufmann, MD  esomeprazole (NEXIUM) 40 MG capsule TAKE 1 CAPSULE BY MOUTH EVERY DAY 04/13/21   Loman Brooklyn, FNP  fluticasone [facility]) 50 MCG/ACT nasal spray INSTILL 2 SPRAYS IN [facility] NOSTRIL ONCE A DAY 11/19/19   Loman Brooklyn, FNP  hydrochlorothiazide (HYDRODIURIL) 25 MG tablet Take 1 tablet (25 mg total) by mouth daily. 04/23/21   Loman Brooklyn, FNP   KLOR-CON M20 20 MEQ tablet TAKE 1 TABLET BY MOUTH EVERY DAY 05/11/21   Loman Brooklyn, FNP  Metoprolol Tartrate 75 MG TABS Take 75 mg by mouth 2 (two) times daily. 01/23/21 02/22/21  Loel Dubonnet, NP  ondansetron (ZOFRAN ODT) 4 MG disintegrating tablet 4mg  ODT q4 hours prn nausea/vomit 05/05/21   [place] Ferguson, MD  polyethylene glycol (MIRALAX / GLYCOLAX) 17 g packet Take 17 g by mouth daily.     [provider]     Allergies:     Allergies  Allergen Reactions   Cozaar [Losartan] Swelling    Oral swelling      Physical Exam:   Vitals  Blood pressure 124/65, pulse (!) 107, temperature (!) 97.5 F (36.4 C), temperature source Oral, resp. rate (!) 21, height 5\' 3"  (1.6 m), weight 60.3 kg, SpO2 100 %.  1.  General: Patient lying supine in bed,  no acute distress   2. Psychiatric: Somnolent and nonverbal  3. Neurologic: Somnolent and nonverbal symmetric face, equal withdrawal to pain in the upper extremities bilaterally  4. HEENMT:  Head is atraumatic, normocephalic, pupils reactive to light, neck is supple, JVD present trachea is midline, mucous membranes are moist   5. Respiratory : Lungs are clear to auscultation bilaterally without wheezing, rhonchi, rales, no cyanosis, no increase in work of breathing or accessory muscle use   6. Cardiovascular : Heart rate normal, rhythm is regular, no murmurs, rubs or gallops, significant peripheral edema, peripheral pulses palpated   7. Gastrointestinal:  Abdomen is soft, nondistended, nontender to palpation bowel sounds active, no masses or organomegaly palpated   8. Skin:  Skin is warm, dry and intact without rashes, acute lesions, or ulcers on limited exam   9.Musculoskeletal:  No acute deformities or trauma, no asymmetry in tone, peripheral pitting edema present, peripheral pulses palpated, no tenderness to palpation in the extremities     Data Review:    CBC Recent Labs  Lab 05/12/2021 1813 05/14/21 0428   WBC 5.8 4.2  HGB 10.7* 11.9*  HCT 34.7* 39.0  PLT 128* 125*  MCV 92.0 92.4  MCH 28.4 28.2  MCHC 30.8 30.5  RDW 15.2 15.3  LYMPHSABS 0.5*  --   MONOABS 0.5  --   EOSABS 0.0  --  BASOSABS 0.0  --    ------------------------------------------------------------------------------------------------------------------  Results for orders placed or performed during the hospital encounter of 05/09/2021 (from the past 48 hour(s))  CBC with Differential     Status: Abnormal   Collection Time: 05/30/2021  6:13 PM  Result Value Ref Range   WBC 5.8 4.0 - 10.5 K/uL   RBC 3.77 (L) 3.87 - 5.11 MIL/uL   Hemoglobin 10.7 (L) 12.0 - 15.0 g/dL   HCT 34.7 (L) 36.0 - 46.0 %   MCV 92.0 80.0 - 100.0 fL   MCH 28.4 26.0 - 34.0 pg   MCHC 30.8 30.0 - 36.0 g/dL   RDW 15.2 11.5 - 15.5 %   Platelets 128 (L) 150 - 400 K/uL   nRBC 0.3 (H) 0.0 - 0.2 %   Neutrophils Relative % 83 %   Neutro Abs 4.8 1.7 - 7.7 K/uL   Lymphocytes Relative 8 %   Lymphs Abs 0.5 (L) 0.7 - 4.0 K/uL   Monocytes Relative 9 %   Monocytes Absolute 0.5 0.1 - 1.0 K/uL   Eosinophils Relative 0 %   Eosinophils Absolute 0.0 0.0 - 0.5 K/uL   Basophils Relative 0 %   Basophils Absolute 0.0 0.0 - 0.1 K/uL   Immature Granulocytes 0 %   Abs Immature Granulocytes 0.02 0.00 - 0.07 K/uL    Comment: Performed at [facility], [address], [place], [place] [address]  Basic metabolic panel     Status: Abnormal   Collection Time: 05/14/2021  6:13 PM  Result Value Ref Range   Sodium 140 135 - 145 mmol/L   Potassium 5.6 (H) 3.5 - 5.1 mmol/L   Chloride 103 98 - 111 mmol/L   CO2 28 22 - 32 mmol/L   Glucose, Bld 162 (H) 70 - 99 mg/dL    Comment: Glucose reference range applies only to samples taken after fasting for at least 8 hours.   BUN 85 (H) 8 - 23 mg/dL   Creatinine, Ser 1.80 (H) 0.44 - 1.00 mg/dL   Calcium 8.7 (L) 8.9 - 10.3 mg/dL   GFR, Estimated 25 (L) >60 mL/min    Comment: (NOTE) Calculated using the CKD-EPI Creatinine Equation (2021)     Anion gap 9 5 - 15    Comment: Performed at [facility], [address], [place], [place] [address]  Troponin I (High Sensitivity)     Status: Abnormal   Collection Time: 05/09/2021  6:13 PM  Result Value Ref Range   Troponin I (High Sensitivity) 51 (H) <18 ng/L    Comment: (NOTE) Elevated high sensitivity troponin I (hsTnI) values and significant  changes across serial measurements may suggest ACS but many other  chronic and acute conditions are known to elevate hsTnI results.  Refer to the "Links" section for chest pain algorithms and additional  guidance. Performed at [facility], [address] [place], [place] [address]   Brain natriuretic peptide     Status: Abnormal   Collection Time: 05/02/2021  6:13 PM  Result Value Ref Range   B Natriuretic Peptide 1,139.0 (H) 0.0 - 100.0 pg/mL    Comment: Performed at [facility], [address] [place], [place] [address]  Lactic acid, plasma     Status: None   Collection Time: 05/27/2021  6:13 PM  Result Value Ref Range   Lactic Acid, Venous 1.7 0.5 - 1.9 mmol/L    Comment: Performed at [facility], [address], [place],  [address]  Culture, blood (routine x 2)  Status: None (Preliminary result)   Collection Time: 05/11/2021  6:18 PM   Specimen: Left Antecubital; Blood  Result Value Ref Range   Specimen Description LEFT ANTECUBITAL    Special Requests      Blood Culture adequate volume BOTTLES DRAWN AEROBIC AND ANAEROBIC Performed at [facility], [address] [place], [place] [address]    Culture PENDING    Report Status PENDING   Resp Panel by RT-PCR (Flu A&B, Covid) Nasopharyngeal Swab     Status: None   Collection Time: 05/23/2021  6:33 PM   Specimen: Nasopharyngeal Swab; Nasopharyngeal(NP) swabs in vial transport medium  Result Value Ref Range   SARS Coronavirus 2 by RT PCR NEGATIVE NEGATIVE    Comment: (NOTE) SARS-CoV-2 target nucleic acids are NOT DETECTED.  The SARS-CoV-2 RNA is generally detectable in upper  respiratory specimens during the acute phase of infection. The lowest concentration of SARS-CoV-2 viral copies this assay can detect is 138 copies/mL. A negative result does not preclude SARS-Cov-2 infection and should not be used as the sole basis for treatment or other patient management decisions. A negative result may occur with  improper specimen collection/handling, submission of specimen other than nasopharyngeal swab, presence of viral mutation(s) within the areas targeted by this assay, and inadequate number of viral copies(<138 copies/mL). A negative result must be combined with clinical observations, patient history, and epidemiological information. The expected result is Negative.  Fact Sheet for Patients:  EntrepreneurPulse.com.au  Fact Sheet for Healthcare Providers:  IncredibleEmployment.be  This test is no t yet approved or cleared by the [place] FDA and  has been authorized for detection and/or diagnosis of SARS-CoV-2 by FDA under an Emergency Use Authorization (EUA). This EUA will remain  in effect (meaning this test can be used) for the duration of the COVID-19 declaration under Section 564(b)(1) of the Act, 21 U.S.C.section 360bbb-3(b)(1), unless the authorization is terminated  or revoked sooner.       Influenza A by PCR NEGATIVE NEGATIVE   Influenza B by PCR NEGATIVE NEGATIVE    Comment: (NOTE) The Xpert Xpress SARS-CoV-2/FLU/RSV plus assay is intended as an aid in the diagnosis of influenza from Nasopharyngeal swab specimens and should not be used as a sole basis for treatment. Nasal washings and aspirates are unacceptable for Xpert Xpress SARS-CoV-2/FLU/RSV testing.  Fact Sheet for Patients: EntrepreneurPulse.com.au  Fact Sheet for Healthcare Providers: IncredibleEmployment.be  This test is not yet approved or cleared by the [place] FDA and has been authorized for  detection and/or diagnosis of SARS-CoV-2 by FDA under an Emergency Use Authorization (EUA). This EUA will remain in effect (meaning this test can be used) for the duration of the COVID-19 declaration under Section 564(b)(1) of the Act, 21 U.S.C. section 360bbb-3(b)(1), unless the authorization is terminated or revoked.  Performed at [facility], [address] [place], [place] [address]   Culture, blood (routine x 2)     Status: None (Preliminary result)   Collection Time: 05/08/2021  7:06 PM   Specimen: BLOOD RIGHT HAND  Result Value Ref Range   Specimen Description BLOOD RIGHT HAND    Special Requests      Blood Culture adequate volume BOTTLES DRAWN AEROBIC AND ANAEROBIC Performed at [facility], [address] [place], [place] [address]    Culture PENDING    Report Status PENDING   Lactic acid, plasma     Status: Abnormal   Collection Time: 05/23/2021  8:29 PM  Result Value Ref Range   Lactic  Acid, Venous 2.3 (HH) 0.5 - 1.9 mmol/L    Comment: CRITICAL RESULT CALLED TO, READ BACK BY AND VERIFIED WITH: WALKER,T ON 05/12/2021 AT 2120 BY LOY,C Performed at [facility], [address] [place], [place] [address]   Troponin I (High Sensitivity)     Status: Abnormal   Collection Time: 05/26/2021  8:29 PM  Result Value Ref Range   Troponin I (High Sensitivity) 49 (H) <18 ng/L    Comment: (NOTE) Elevated high sensitivity troponin I (hsTnI) values and significant  changes across serial measurements may suggest ACS but many other  chronic and acute conditions are known to elevate hsTnI results.  Refer to the "Links" section for chest pain algorithms and additional  guidance. Performed at [facility], [address], [place], [place] [address]   Lactic acid, plasma     Status: Abnormal   Collection Time: 05/25/2021 11:05 PM  Result Value Ref Range   Lactic Acid, Venous 2.3 (HH) 0.5 - 1.9 mmol/L    Comment: CRITICAL VALUE NOTED.  VALUE IS CONSISTENT WITH PREVIOUSLY REPORTED AND CALLED  VALUE. Performed at [facility], [address] [place], [place] [address]   Type and screen     Status: None   Collection Time: 05/14/21 12:24 AM  Result Value Ref Range   ABO/RH(D) A POS    Antibody Screen NEG    Sample Expiration      06/01/2021,2359 Performed at [facility], [address], [place], [place] [address]   Lactic acid, plasma     Status: None   Collection Time: 05/14/21  2:15 AM  Result Value Ref Range   Lactic Acid, Venous 1.8 0.5 - 1.9 mmol/L    Comment: Performed at [facility], [address], [place], [place] [address]  Comprehensive metabolic panel     Status: Abnormal   Collection Time: 05/14/21  4:28 AM  Result Value Ref Range   Sodium 142 135 - 145 mmol/L   Potassium 5.0 3.5 - 5.1 mmol/L   Chloride 108 98 - 111 mmol/L   CO2 24 22 - 32 mmol/L   Glucose, Bld 161 (H) 70 - 99 mg/dL    Comment: Glucose reference range applies only to samples taken after fasting for at least 8 hours.   BUN 74 (H) 8 - 23 mg/dL   Creatinine, Ser 1.42 (H) 0.44 - 1.00 mg/dL   Calcium 8.6 (L) 8.9 - 10.3 mg/dL   Total Protein 7.3 6.5 - 8.1 g/dL   Albumin 3.5 3.5 - 5.0 g/dL   AST 113 (H) 15 - 41 U/L   ALT 218 (H) 0 - 44 U/L   Alkaline Phosphatase 209 (H) 38 - 126 U/L   Total Bilirubin 0.8 0.3 - 1.2 mg/dL   GFR, Estimated 33 (L) >60 mL/min    Comment: (NOTE) Calculated using the CKD-EPI Creatinine Equation (2021)    Anion gap 10 5 - 15    Comment: Performed at [facility], [address], [place], [place] [address]  Magnesium     Status: Abnormal   Collection Time: 05/14/21  4:28 AM  Result Value Ref Range   Magnesium 2.7 (H) 1.7 - 2.4 mg/dL    Comment: Performed at [facility], [address], [place], [place] [address]  CBC     Status: Abnormal   Collection Time: 05/14/21  4:28 AM  Result Value Ref Range   WBC 4.2 4.0 - 10.5 K/uL   RBC 4.22 3.87 - 5.11 MIL/uL   Hemoglobin 11.9 (  L) 12.0 - 15.0 g/dL   HCT 39.0 36.0 - 46.0 %   MCV 92.4 80.0 - 100.0 fL   MCH 28.2 26.0  - 34.0 pg   MCHC 30.5 30.0 - 36.0 g/dL   RDW 15.3 11.5 - 15.5 %   Platelets 125 (L) 150 - 400 K/uL    Comment: SPECIMEN CHECKED FOR CLOTS CONSISTENT WITH PREVIOUS RESULT    nRBC 0.0 0.0 - 0.2 %    Comment: Performed at [facility], [address] [place], [place] [address]    Chemistries  Recent Labs  Lab 05/03/2021 1813 05/14/21 0428  NA 140 142  K 5.6* 5.0  CL 103 108  CO2 28 24  GLUCOSE 162* 161*  BUN 85* 74*  CREATININE 1.80* 1.42*  CALCIUM 8.7* 8.6*  MG  --  2.7*  AST  --  113*  ALT  --  218*  ALKPHOS  --  209*  BILITOT  --  0.8   ------------------------------------------------------------------------------------------------------------------  ------------------------------------------------------------------------------------------------------------------ GFR: Estimated Creatinine Clearance: 17.9 mL/min (A) (by C-G formula based on SCr of 1.42 mg/dL (H)). Liver Function Tests: Recent Labs  Lab 05/14/21 0428  AST 113*  ALT 218*  ALKPHOS 209*  BILITOT 0.8  PROT 7.3  ALBUMIN 3.5   No results for input(s): LIPASE, AMYLASE in the last 168 hours. No results for input(s): AMMONIA in the last 168 hours. Coagulation Profile: No results for input(s): INR, PROTIME in the last 168 hours. Cardiac Enzymes: No results for input(s): CKTOTAL, CKMB, CKMBINDEX, TROPONINI in the last 168 hours. BNP (last 3 results) No results for input(s): PROBNP in the last 8760 hours. HbA1C: No results for input(s): HGBA1C in the last 72 hours. CBG: No results for input(s): GLUCAP in the last 168 hours. Lipid Profile: No results for input(s): CHOL, HDL, LDLCALC, TRIG, CHOLHDL, LDLDIRECT in the last 72 hours. Thyroid Function Tests: No results for input(s): TSH, T4TOTAL, FREET4, T3FREE, THYROIDAB in the last 72 hours. Anemia Panel: No results for input(s): VITAMINB12, FOLATE, FERRITIN, TIBC, IRON, RETICCTPCT in the last 72  hours.  --------------------------------------------------------------------------------------------------------------- Urine analysis:    Component Value Date/Time   COLORURINE YELLOW 05/05/2021 1108   APPEARANCEUR CLEAR 05/05/2021 1108   LABSPEC 1.010 05/05/2021 1108   PHURINE 7.0 05/05/2021 1108   GLUCOSEU NEGATIVE 05/05/2021 1108   HGBUR SMALL (A) 05/05/2021 1108   BILIRUBINUR NEGATIVE 05/05/2021 1108   KETONESUR NEGATIVE 05/05/2021 1108   PROTEINUR 100 (A) 05/05/2021 1108   UROBILINOGEN 0.2 04/20/2015 1445   NITRITE NEGATIVE 05/05/2021 1108   LEUKOCYTESUR MODERATE (A) 05/05/2021 1108      Imaging Results:    DG Chest Port 1 View  Result Date: 05/05/2021 CLINICAL DATA:  Short of breath, history of left breast cancer, hypoxia EXAM: PORTABLE CHEST 1 VIEW COMPARISON:  05/26/2020 FINDINGS: Single frontal view of the chest demonstrates an unremarkable cardiac silhouette. Atherosclerosis of the aortic arch. No acute airspace disease, effusion, or pneumothorax. No acute bony abnormalities. IMPRESSION: 1. No acute intrathoracic process. Electronically Signed   By: Randa Ngo M.D.   On: 05/04/2021 22:29       Assessment & Plan:    Active Problems:   AKI (acute kidney injury) ([place])   Lactic acidosis   GI bleed   AKI Likely secondary to dehydration Creatinine increased from 0.8-1.8 Hold nephrotoxic agents when possible 1 L normal saline given in the ED, trend in the a.m. There is concern for possible cardiorenal syndrome, however patient does not have history of CHF.  She  does have JVD and peripheral edema today with an BNP of 1100.  No vascular congestion or pulmonary edema noted on chest x-ray Continue to monitor Lactic acidosis Likely also secondary to dehydration Lactic acid improved from 2.3-1.8 after 1 L fluid Continue to monitor GI bleeding Hemoglobin initially dropped from 13-10, type and screen done Hemoglobin now is better at almost 11 ED provider reports  that the occult blood was negative Patient has been typed and screened Will not keep patient n.p.o. as it is unlikely with a stable hemoglobin that procedures will be done outpatient with this advanced age Continue to monitor Hypertension Hold hydrochlorothiazide in the setting of AKI Continue amlodipine Acute respiratory failure with hypoxia Patient was treated for COPD in the ED with albuterol, DuoNeb, Solu-Medrol and had improvement Continue Solu-Medrol, continue as needed albuterol Continue supplemental oxygen and wean patient off as tolerated Low threshold for repeat chest x-ray to evaluate for any pulmonary edema, in which case Lasix should be trialed COVID-negative Troponin flat Continue to monitor    DVT Prophylaxis- SCDs   AM Labs Ordered, also please review Full Orders  Family Communication: No family at bedside full  Code Status:  Full  Admission status: Inpatient :The appropriate admission status for this patient is INPATIENT. Inpatient status is judged to be reasonable and necessary in order to provide the required intensity of service to ensure the patient's safety. The patient's presenting symptoms, physical exam findings, and initial radiographic and laboratory data in the context of their chronic comorbidities is felt to place them at high risk for further clinical deterioration. Furthermore, it is not anticipated that the patient will be medically stable for discharge from the hospital within 2 midnights of admission. The following factors support the admission status of inpatient.     The patient's presenting symptoms include GI bleed and dyspnea. The worrisome physical exam findings include tachycardia. The initial radiographic and laboratory data are worrisome because of lactic acidosis. The chronic co-morbidities include hypertension, hyperlipidemia.       * I certify that at the point of admission it is my clinical judgment that the patient will require  inpatient hospital care spanning beyond 2 midnights from the point of admission due to high intensity of service, high risk for further deterioration and high frequency of surveillance required.*  Time spent in minutes : [place]

## 2021-05-14 NOTE — TOC Initial Note (Signed)
Transition of Care [facility]) - Initial/Assessment Note    Patient Details  Name: [name] MRN: [id number] Date of Birth: 11/21/1919  Transition of Care [facility]) CM/SW Contact:    Salome Arnt, [place] Phone Number: 05/14/2021, 1:04 PM  Clinical Narrative:  Pt admitted due to acute kidney injury. LCSW spoke with pt's son, Donison who reports he is HCPOA. Donison indicates pt lives alone and has 2 hours a day of PCS services. They recently started paying for someone to stay overnight with pt as well. Son reports pt has been very weak the past 2 weeks. She ambulates with a cane at baseline. PT evaluated pt and recommend SNF. Son states they had started working on placement from home this week with PCP completing FL2. He requests facility in [place]. LCSW will initiate bed search. Pt is COVID vaccinated x4. Will need to complete PASRR when able as system is currently down.                  Expected Discharge Plan: Skilled Nursing Facility Barriers to Discharge: Continued Medical Work up   Patient Goals and CMS Choice Patient states their goals for this hospitalization and ongoing recovery are:: short term SNF   Choice offered to / list presented to : Adult Children  Expected Discharge Plan and Services Expected Discharge Plan: [place] In-house Referral: Clinical Social Work   Post Acute Care Choice: [place] Living arrangements for the past 2 months: [place]                 DME Arranged: N/A                    Prior Living Arrangements/Services Living arrangements for the past 2 months: Single Family Home Lives with:: Self Patient language and need for interpreter reviewed:: Yes        Need for Family Participation in Patient Care: Yes (Comment)   Current home services: DME (cane) Criminal Activity/Legal Involvement Pertinent to Current Situation/Hospitalization: No - Comment as needed  Activities of Daily  Living      Permission Sought/Granted                  Emotional Assessment         Alcohol / Substance Use: Not Applicable Psych Involvement: No (comment)  Admission diagnosis:  AKI (acute kidney injury) ([place]) [N17.9] Anemia, unspecified type [D64.9] Patient Active Problem List   Diagnosis Date Noted   Lactic acidosis 05/14/2021   GI bleed 05/14/2021   AKI (acute kidney injury) ([place]) 05/10/2021   Bilateral lower extremity edema 04/17/2021   Prediabetes 09/23/2020   Hypokalemia 09/23/2020   Paroxysmal SVT (supraventricular tachycardia) ([place]) 07/15/2020   Vaginal bleeding 07/15/2020   Thrombocytopenia ([place]) 07/15/2020   At high risk for falls 04/04/2020   Urge incontinence 04/04/2020   Stage 3a chronic kidney disease ([place]) 02/21/2020   Arthralgia of right hip 04/21/2015   GERD (gastroesophageal reflux disease) 11/26/2014   Hyperlipidemia 01/01/2014   Right bundle branch block 09/26/2011   Essential hypertension, benign 09/26/2011   PCP:  Loman Brooklyn, FNP Pharmacy:   [id number] - [place], [place] [address] Phone: [phone] Fax: [phone]     Social Determinants of Health (SDOH) Interventions    Readmission Risk Interventions No flowsheet data found.

## 2021-05-14 NOTE — NC FL2 (Signed)
[place] LEVEL OF CARE SCREENING TOOL     IDENTIFICATION  Patient Name: [name] Birthdate: [birth year] Sex: female Admission Date (Current Location): 05/19/2021  [place] and [place] Number:  [place] [id number] [place] and Address:  [facility] [address] [place]      Provider Number: [id number]  Attending Physician Name and Address:  Murlean Iba, MD  Relative Name and Phone Number:       Current Level of Care: Hospital Recommended Level of Care: [place] Prior Approval Number:    Date Approved/Denied:   PASRR Number: pending- system down  Discharge Plan: SNF    Current Diagnoses: Patient Active Problem List   Diagnosis Date Noted   Lactic acidosis 05/14/2021   GI bleed 05/14/2021   Anemia    AKI (acute kidney injury) ([place]) 05/20/2021   Bilateral lower extremity edema 04/17/2021   Prediabetes 09/23/2020   Hypokalemia 09/23/2020   Paroxysmal SVT (supraventricular tachycardia) ([place]) 07/15/2020   Vaginal bleeding 07/15/2020   Thrombocytopenia ([place]) 07/15/2020   At high risk for falls 04/04/2020   Urge incontinence 04/04/2020   Stage 3a chronic kidney disease ([place]) 02/21/2020   Arthralgia of right hip 04/21/2015   GERD (gastroesophageal reflux disease) 11/26/2014   Hyperlipidemia 01/01/2014   Right bundle branch block 09/26/2011   Essential hypertension, benign 09/26/2011    Orientation RESPIRATION BLADDER Height & Weight     Self, Place  Normal External catheter Weight: 130 lb 4.7 oz (59.1 kg) Height:  5\' 7"  (170.2 cm)  BEHAVIORAL SYMPTOMS/MOOD NEUROLOGICAL BOWEL NUTRITION STATUS      Incontinent Diet (Soft diet. See d/c summary for updates.)  AMBULATORY STATUS COMMUNICATION OF NEEDS Skin   Extensive Assist Verbally Normal                       Personal Care Assistance Level of Assistance  Bathing, Feeding, Dressing Bathing Assistance: Maximum assistance Feeding  assistance: Limited assistance Dressing Assistance: Maximum assistance     Functional Limitations Info  Sight, Hearing, Speech Sight Info: Impaired Hearing Info: Adequate Speech Info: Adequate    SPECIAL CARE FACTORS FREQUENCY  PT (By licensed PT)     PT Frequency: 5x weekly              Contractures      Additional Factors Info  Code Status, Allergies Code Status Info: Full code Allergies Info: Cozaar (Losartan)           Current Medications (05/14/2021):  This is the current hospital active medication list Current Facility-Administered Medications  Medication Dose Route Frequency Provider Last Rate Last Admin   acetaminophen (TYLENOL) tablet 650 mg  650 mg Oral Q6H PRN Zierle-Ghosh, Asia B, DO       Or   acetaminophen (TYLENOL) suppository 650 mg  650 mg Rectal Q6H PRN Zierle-Ghosh, Asia B, DO       albuterol (PROVENTIL) (2.5 MG/3ML) 0.083% nebulizer solution 2.5 mg  2.5 mg Nebulization Q2H PRN Zierle-Ghosh, Asia B, DO       amLODipine (NORVASC) tablet 5 mg  5 mg Oral Daily Johnson, Clanford L, MD   5 mg at 05/14/21 1059   aspirin EC tablet 81 mg  81 mg Oral Daily Zierle-Ghosh, Asia B, DO   81 mg at 05/14/21 1059   metoprolol tartrate (LOPRESSOR) tablet 50 mg  50 mg Oral BID Johnson, Clanford L, MD   50 mg at 05/14/21 1059   ondansetron (ZOFRAN)  tablet 4 mg  4 mg Oral Q6H PRN Zierle-Ghosh, Asia B, DO       Or   ondansetron (ZOFRAN) injection 4 mg  4 mg Intravenous Q6H PRN Zierle-Ghosh, Asia B, DO         Discharge Medications: Please see discharge summary for a list of discharge medications.  Relevant Imaging Results:  Relevant Lab Results:   Additional Information SSN: [id number]. Pt is COVID vaccinated x4.  Salome Arnt, LCSW

## 2021-05-14 NOTE — Telephone Encounter (Signed)
Aware FL2 ready for pick up

## 2021-05-14 NOTE — Progress Notes (Addendum)
ASSUMPTION OF CARE NOTE   05/14/2021 1:11 PM  [name] was seen and examined.  The H&P by the admitting provider, orders, imaging was reviewed.  Please see new orders.  Will continue to follow.  I am very concerned that patient may be at the end of life now that she is not eating and drinking to support her daily needs.  Given this, I am asking for an inpatient palliative care consultation for ongoing discussions regarding goals of care and end-of-life.  At minimum patient will need some type of placement as I no longer believe that she can care for herself.  Also, noted elevated LFTs.  Recheck in AM.    Vitals:   05/14/21 1200 05/14/21 1255  BP: (!) 144/80 (!) 145/76  Pulse: 99 75  Resp: 19 20  Temp:  97.9 F (36.6 C)  SpO2: 97% 93%    Results for orders placed or performed during the hospital encounter of 05/02/2021  Culture, blood (routine x 2)   Specimen: BLOOD RIGHT HAND  Result Value Ref Range   Specimen Description BLOOD RIGHT HAND    Special Requests      Blood Culture adequate volume BOTTLES DRAWN AEROBIC AND ANAEROBIC   Culture      NO GROWTH < 24 HOURS Performed at [facility], [address], [place], [place] [address]    Report Status PENDING   Culture, blood (routine x 2)   Specimen: Left Antecubital; Blood  Result Value Ref Range   Specimen Description LEFT ANTECUBITAL    Special Requests      Blood Culture adequate volume BOTTLES DRAWN AEROBIC AND ANAEROBIC   Culture      NO GROWTH < 24 HOURS Performed at [facility], [address] [place], [place] [address]    Report Status PENDING   Resp Panel by RT-PCR (Flu A&B, Covid) Nasopharyngeal Swab   Specimen: Nasopharyngeal Swab; Nasopharyngeal(NP) swabs in vial transport medium  Result Value Ref Range   SARS Coronavirus 2 by RT PCR NEGATIVE NEGATIVE   Influenza A by PCR NEGATIVE NEGATIVE   Influenza B by PCR NEGATIVE NEGATIVE  CBC with Differential  Result Value Ref Range   WBC 5.8 4.0 - 10.5 K/uL    RBC 3.77 (L) 3.87 - 5.11 MIL/uL   Hemoglobin 10.7 (L) 12.0 - 15.0 g/dL   HCT 34.7 (L) 36.0 - 46.0 %   MCV 92.0 80.0 - 100.0 fL   MCH 28.4 26.0 - 34.0 pg   MCHC 30.8 30.0 - 36.0 g/dL   RDW 15.2 11.5 - 15.5 %   Platelets 128 (L) 150 - 400 K/uL   nRBC 0.3 (H) 0.0 - 0.2 %   Neutrophils Relative % 83 %   Neutro Abs 4.8 1.7 - 7.7 K/uL   Lymphocytes Relative 8 %   Lymphs Abs 0.5 (L) 0.7 - 4.0 K/uL   Monocytes Relative 9 %   Monocytes Absolute 0.5 0.1 - 1.0 K/uL   Eosinophils Relative 0 %   Eosinophils Absolute 0.0 0.0 - 0.5 K/uL   Basophils Relative 0 %   Basophils Absolute 0.0 0.0 - 0.1 K/uL   Immature Granulocytes 0 %   Abs Immature Granulocytes 0.02 0.00 - 0.07 K/uL  Basic metabolic panel  Result Value Ref Range   Sodium 140 135 - 145 mmol/L   Potassium 5.6 (H) 3.5 - 5.1 mmol/L   Chloride 103 98 - 111 mmol/L   CO2 28 22 - 32 mmol/L   Glucose, Bld 162 (  H) 70 - 99 mg/dL   BUN 85 (H) 8 - 23 mg/dL   Creatinine, Ser 1.80 (H) 0.44 - 1.00 mg/dL   Calcium 8.7 (L) 8.9 - 10.3 mg/dL   GFR, Estimated 25 (L) >60 mL/min   Anion gap 9 5 - 15  Brain natriuretic peptide  Result Value Ref Range   B Natriuretic Peptide 1,139.0 (H) 0.0 - 100.0 pg/mL  Lactic acid, plasma  Result Value Ref Range   Lactic Acid, Venous 1.7 0.5 - 1.9 mmol/L  Lactic acid, plasma  Result Value Ref Range   Lactic Acid, Venous 2.3 (HH) 0.5 - 1.9 mmol/L  Lactic acid, plasma  Result Value Ref Range   Lactic Acid, Venous 2.3 (HH) 0.5 - 1.9 mmol/L  Lactic acid, plasma  Result Value Ref Range   Lactic Acid, Venous 1.8 0.5 - 1.9 mmol/L  Comprehensive metabolic panel  Result Value Ref Range   Sodium 142 135 - 145 mmol/L   Potassium 5.0 3.5 - 5.1 mmol/L   Chloride 108 98 - 111 mmol/L   CO2 24 22 - 32 mmol/L   Glucose, Bld 161 (H) 70 - 99 mg/dL   BUN 74 (H) 8 - 23 mg/dL   Creatinine, Ser 1.42 (H) 0.44 - 1.00 mg/dL   Calcium 8.6 (L) 8.9 - 10.3 mg/dL   Total Protein 7.3 6.5 - 8.1 g/dL   Albumin 3.5 3.5 - 5.0 g/dL    AST 113 (H) 15 - 41 U/L   ALT 218 (H) 0 - 44 U/L   Alkaline Phosphatase 209 (H) 38 - 126 U/L   Total Bilirubin 0.8 0.3 - 1.2 mg/dL   GFR, Estimated 33 (L) >60 mL/min   Anion gap 10 5 - 15  Magnesium  Result Value Ref Range   Magnesium 2.7 (H) 1.7 - 2.4 mg/dL  CBC  Result Value Ref Range   WBC 4.2 4.0 - 10.5 K/uL   RBC 4.22 3.87 - 5.11 MIL/uL   Hemoglobin 11.9 (L) 12.0 - 15.0 g/dL   HCT 39.0 36.0 - 46.0 %   MCV 92.4 80.0 - 100.0 fL   MCH 28.2 26.0 - 34.0 pg   MCHC 30.5 30.0 - 36.0 g/dL   RDW 15.3 11.5 - 15.5 %   Platelets 125 (L) 150 - 400 K/uL   nRBC 0.0 0.0 - 0.2 %  TSH  Result Value Ref Range   TSH 1.445 0.350 - 4.500 uIU/mL  POC occult blood, ED  Result Value Ref Range   Fecal Occult Bld NEGATIVE NEGATIVE  Type and screen  Result Value Ref Range   ABO/RH(D) A POS    Antibody Screen NEG    Sample Expiration      2021-06-12,2359 Performed at [facility], [address], [place], [place] [address]   Troponin I (High Sensitivity)  Result Value Ref Range   Troponin I (High Sensitivity) 51 (H) <18 ng/L  Troponin I (High Sensitivity)  Result Value Ref Range   Troponin I (High Sensitivity) 49 (H) <18 ng/L   Time spent: 35 minutes   Murvin Natal, MD [facility]   05/20/2021  5:47 PM How to contact the [facility] Attending or Consulting provider 7A - 7P or covering provider during after hours [place], for this patient?  Check the care team in [facility] and look for a) attending/consulting TRH provider listed and b) the [facility] team listed Log into [email or web address] and use [place]'s universal password to access. If you do not have the password, please contact  the hospital operator. Locate the [facility] provider you are looking for under [facility] and page to a number that you can be directly reached. If you still have difficulty reaching the provider, please page the [facility] (Director on Call) for the Hospitalists listed on amion for assistance.

## 2021-05-15 DIAGNOSIS — K922 Gastrointestinal hemorrhage, unspecified: Secondary | ICD-10-CM | POA: Diagnosis not present

## 2021-05-15 DIAGNOSIS — Z7189 Other specified counseling: Secondary | ICD-10-CM

## 2021-05-15 DIAGNOSIS — N179 Acute kidney failure, unspecified: Secondary | ICD-10-CM | POA: Diagnosis not present

## 2021-05-15 DIAGNOSIS — Z515 Encounter for palliative care: Secondary | ICD-10-CM

## 2021-05-15 DIAGNOSIS — E872 Acidosis, unspecified: Secondary | ICD-10-CM | POA: Diagnosis not present

## 2021-05-15 LAB — CBC
HCT: 37.1 % (ref 36.0–46.0)
Hemoglobin: 11 g/dL — ABNORMAL LOW (ref 12.0–15.0)
MCH: 28.2 pg (ref 26.0–34.0)
MCHC: 29.6 g/dL — ABNORMAL LOW (ref 30.0–36.0)
MCV: 95.1 fL (ref 80.0–100.0)
Platelets: 122 10*3/uL — ABNORMAL LOW (ref 150–400)
RBC: 3.9 MIL/uL (ref 3.87–5.11)
RDW: 15.4 % (ref 11.5–15.5)
WBC: 6 10*3/uL (ref 4.0–10.5)
nRBC: 0.8 % — ABNORMAL HIGH (ref 0.0–0.2)

## 2021-05-15 LAB — HEPATIC FUNCTION PANEL
ALT: 188 U/L — ABNORMAL HIGH (ref 0–44)
AST: 90 U/L — ABNORMAL HIGH (ref 15–41)
Albumin: 3.3 g/dL — ABNORMAL LOW (ref 3.5–5.0)
Alkaline Phosphatase: 180 U/L — ABNORMAL HIGH (ref 38–126)
Bilirubin, Direct: 0.3 mg/dL — ABNORMAL HIGH (ref 0.0–0.2)
Indirect Bilirubin: 0.7 mg/dL (ref 0.3–0.9)
Total Bilirubin: 1 mg/dL (ref 0.3–1.2)
Total Protein: 6.8 g/dL (ref 6.5–8.1)

## 2021-05-15 LAB — MAGNESIUM: Magnesium: 2.8 mg/dL — ABNORMAL HIGH (ref 1.7–2.4)

## 2021-05-15 LAB — BASIC METABOLIC PANEL
Anion gap: 6 (ref 5–15)
BUN: 70 mg/dL — ABNORMAL HIGH (ref 8–23)
CO2: 31 mmol/L (ref 22–32)
Calcium: 8.8 mg/dL — ABNORMAL LOW (ref 8.9–10.3)
Chloride: 109 mmol/L (ref 98–111)
Creatinine, Ser: 1.44 mg/dL — ABNORMAL HIGH (ref 0.44–1.00)
GFR, Estimated: 33 mL/min — ABNORMAL LOW (ref >60)
Glucose, Bld: 105 mg/dL — ABNORMAL HIGH (ref 70–99)
Potassium: 4.9 mmol/L (ref 3.5–5.1)
Sodium: 146 mmol/L — ABNORMAL HIGH (ref 135–145)

## 2021-05-15 MED ORDER — LORAZEPAM 2 MG/ML IJ SOLN
0.5000 mg | INTRAMUSCULAR | Status: DC | PRN
  Administered 2021-05-15 – 2021-05-16 (×2): 0.5 mg via INTRAVENOUS
  Filled 2021-05-15 (×2): qty 1

## 2021-05-15 MED ORDER — DEXTROSE 5 % IV SOLN: INTRAVENOUS | Status: DC

## 2021-05-15 MED ORDER — PANTOPRAZOLE SODIUM 40 MG PO TBEC
40.0000 mg | DELAYED_RELEASE_TABLET | Freq: Every day | ORAL | Status: DC
  Administered 2021-05-15: 40 mg via ORAL
  Filled 2021-05-15 (×2): qty 1

## 2021-05-15 NOTE — Progress Notes (Signed)
PROGRESS NOTE   [name]  AYT:[id number] DOB: [birth year] DOA: 05/30/2021 PCP: Loman Brooklyn, FNP   Chief Complaint  Patient presents with   Shortness of Breath   Level of care: Telemetry  Brief Admission History:  87 y.o. female, with history of breast cancer, GERD, hyperlipidemia, hypertension, SVT, vertigo, and more presents ED with a chief complaint of rectal bleeding versus wheezing.  According to triage patient was brought in via EMS for shortness of breath and weakness.  When EMS arrived to her her O2 sats were in the 61s and 90s.  She was given albuterol neb in route.  Family had reported to the EMS the patient has had a steady decline over the past week.  The ED provider spoke with family they reported that the caregiver noticed bloody stools for the past 2 or 3 days.  Patient herself has had no complaints.  Unfortunately patient is not able to give any history secondary to dementia.  In the ED she was found to be afebrile with stable heart rate, intermittent tachypnea up to 25, blood pressure stable 118/48, satting at 95% on 2.5 L nasal cannula.  No leukocytosis with white blood cell count 5.8, hemoglobin 10.7.  Slightly elevated potassium of 5.6, AKI is revealed with a creatinine of 1.8, baseline 0.8.  BNP is significantly elevated at 1139, prior was in the 200s.  Troponins are 51 and 49.  Lactic acid is 2.3, but improved to 1.8 after 1 L of fluid.    Assessment & Plan:   Active Problems:   AKI (acute kidney injury) ([place])   Lactic acidosis   GI bleed   Anemia   AKI - prerenal from poor oral intake - continue with gentle IV hydration  - check BMP in morning  Hypernatremia  - add more free water to IV fluids - changed to D5W infusion  - recheck in AM  Lactic acidosis - resolved after IV fluid hydration  - continue IV fluid    Reported bloody stool at home  - hemoccult to stools ordered - add protonix daily for GI protection - await palliative discussions  regarding [place]   Essential hypertension  - HCTZ on hold due to AKI and dehydration  - family requested patient not take amlodipine due to leg edema  - resumed home metoprolol - add hydralazine as needed   Acute respiratory failure with hypoxia - ruled out  - pt stable on room air     DVT prophylaxis: SCD Code Status: full  Family Communication: none present during rounds Disposition: working on SNF placement  Status is: Inpatient  Remains inpatient appropriate because: ongoing need for IV fluid    Consultants:  Palliative care   Procedures:    Antimicrobials:     Subjective: Pt reports she doesn't have much appetite but no other complaints.   Objective: Vitals:   05/14/21 1255 05/14/21 1600 05/14/21 2030 05/15/21 0640  BP: (!) 145/76 (!) 154/70 (!) 158/92 127/88  Pulse: 75 74 78 (!) 110  Resp: 20  18 20   Temp: 97.9 F (36.6 C) 97.9 F (36.6 C) 98.2 F (36.8 C) 98.9 F (37.2 C)  TempSrc: Oral Oral    SpO2: 93% 96% 99% 96%  Weight: 59.1 kg     Height: 5\' 7"  (1.702 m)       Intake/Output Summary (Last 24 hours) at 05/15/2021 1020 Last data filed at 05/15/2021 0701 Gross per 24 hour  Intake 370.88 ml  Output 200 ml  Net 170.88 ml   Filed Weights   05/22/2021 1753 05/14/21 1255  Weight: 60.3 kg 59.1 kg    Examination:  General exam: frail, elderly female, awake, NAD, Appears calm and comfortable  Respiratory system: Clear to auscultation. Respiratory effort normal. Cardiovascular system: normal S1 & S2 heard. No JVD, murmurs, rubs, gallops or clicks. No pedal edema. Gastrointestinal system: Abdomen is nondistended, soft and nontender. No organomegaly or masses felt. Normal bowel sounds heard. Central nervous system: Alert and oriented. No focal neurological deficits. Extremities: Symmetric 5 x 5 power. Skin: No rashes, lesions or ulcers Psychiatry: Judgement and insight appear normal. Mood & affect appropriate.   Data Reviewed: I have personally  reviewed following labs and imaging studies  CBC: Recent Labs  Lab 05/12/2021 1813 05/14/21 0428  WBC 5.8 4.2  NEUTROABS 4.8  --   HGB 10.7* 11.9*  HCT 34.7* 39.0  MCV 92.0 92.4  PLT 128* 125*    Basic Metabolic Panel: Recent Labs  Lab 05/05/2021 1813 05/14/21 0428 05/15/21 0639  NA 140 142 146*  K 5.6* 5.0 4.9  CL 103 108 109  CO2 28 24 31   GLUCOSE 162* 161* 105*  BUN 85* 74* 70*  CREATININE 1.80* 1.42* 1.44*  CALCIUM 8.7* 8.6* 8.8*  MG  --  2.7* 2.8*    GFR: Estimated Creatinine Clearance: 19.9 mL/min (A) (by C-G formula based on SCr of 1.44 mg/dL (H)).  Liver Function Tests: Recent Labs  Lab 05/14/21 0428 05/15/21 0639  AST 113* 90*  ALT 218* 188*  ALKPHOS 209* 180*  BILITOT 0.8 1.0  PROT 7.3 6.8  ALBUMIN 3.5 3.3*    CBG: No results for input(s): GLUCAP in the last 168 hours.  Recent Results (from the past 240 hour(s))  Urine Culture     Status: Abnormal   Collection Time: 05/05/21  2:47 PM   Specimen: Urine, Clean Catch  Result Value Ref Range Status   Specimen Description   Final    URINE, CLEAN CATCH Performed at [facility], [address] [place], [place] [address]    Special Requests   Final    NONE Performed at [facility], [address], [place], [place] [address]    Culture MULTIPLE SPECIES PRESENT, SUGGEST RECOLLECTION (A)  Final   Report Status 05/06/2021 FINAL  Final  Culture, blood (routine x 2)     Status: None (Preliminary result)   Collection Time: 05/30/2021  6:18 PM   Specimen: Left Antecubital; Blood  Result Value Ref Range Status   Specimen Description LEFT ANTECUBITAL  Final   Special Requests   Final    Blood Culture adequate volume BOTTLES DRAWN AEROBIC AND ANAEROBIC   Culture   Final    NO GROWTH 2 DAYS Performed at [facility], [address], [place], [place] [address]    Report Status PENDING  Incomplete  Resp Panel by RT-PCR (Flu A&B, Covid) Nasopharyngeal Swab     Status: None   Collection Time: 05/09/2021  6:33  PM   Specimen: Nasopharyngeal Swab; Nasopharyngeal(NP) swabs in vial transport medium  Result Value Ref Range Status   SARS Coronavirus 2 by RT PCR NEGATIVE NEGATIVE Final    Comment: (NOTE) SARS-CoV-2 target nucleic acids are NOT DETECTED.  The SARS-CoV-2 RNA is generally detectable in upper respiratory specimens during the acute phase of infection. The lowest concentration of SARS-CoV-2 viral copies this assay can detect is 138 copies/mL. A negative result does not preclude SARS-Cov-2 infection and should not be used as  the sole basis for treatment or other patient management decisions. A negative result may occur with  improper specimen collection/handling, submission of specimen other than nasopharyngeal swab, presence of viral mutation(s) within the areas targeted by this assay, and inadequate number of viral copies(<138 copies/mL). A negative result must be combined with clinical observations, patient history, and epidemiological information. The expected result is Negative.  Fact Sheet for Patients:  EntrepreneurPulse.com.au  Fact Sheet for Healthcare Providers:  IncredibleEmployment.be  This test is no t yet approved or cleared by the [place] FDA and  has been authorized for detection and/or diagnosis of SARS-CoV-2 by FDA under an Emergency Use Authorization (EUA). This EUA will remain  in effect (meaning this test can be used) for the duration of the COVID-19 declaration under Section 564(b)(1) of the Act, 21 U.S.C.section 360bbb-3(b)(1), unless the authorization is terminated  or revoked sooner.       Influenza A by PCR NEGATIVE NEGATIVE Final   Influenza B by PCR NEGATIVE NEGATIVE Final    Comment: (NOTE) The Xpert Xpress SARS-CoV-2/FLU/RSV plus assay is intended as an aid in the diagnosis of influenza from Nasopharyngeal swab specimens and should not be used as a sole basis for treatment. Nasal washings and aspirates are  unacceptable for Xpert Xpress SARS-CoV-2/FLU/RSV testing.  Fact Sheet for Patients: EntrepreneurPulse.com.au  Fact Sheet for Healthcare Providers: IncredibleEmployment.be  This test is not yet approved or cleared by the [place] FDA and has been authorized for detection and/or diagnosis of SARS-CoV-2 by FDA under an Emergency Use Authorization (EUA). This EUA will remain in effect (meaning this test can be used) for the duration of the COVID-19 declaration under Section 564(b)(1) of the Act, 21 U.S.C. section 360bbb-3(b)(1), unless the authorization is terminated or revoked.  Performed at [facility], [address] [place], [place] [address]   Culture, blood (routine x 2)     Status: None (Preliminary result)   Collection Time: 05/28/2021  7:06 PM   Specimen: BLOOD RIGHT HAND  Result Value Ref Range Status   Specimen Description BLOOD RIGHT HAND  Final   Special Requests   Final    Blood Culture adequate volume BOTTLES DRAWN AEROBIC AND ANAEROBIC   Culture   Final    NO GROWTH 2 DAYS Performed at [facility], [address], [place], [place] [address]    Report Status PENDING  Incomplete     Radiology Studies: DG Chest Port 1 View  Result Date: 05/06/2021 CLINICAL DATA:  Short of breath, history of left breast cancer, hypoxia EXAM: PORTABLE CHEST 1 VIEW COMPARISON:  05/26/2020 FINDINGS: Single frontal view of the chest demonstrates an unremarkable cardiac silhouette. Atherosclerosis of the aortic arch. No acute airspace disease, effusion, or pneumothorax. No acute bony abnormalities. IMPRESSION: 1. No acute intrathoracic process. Electronically Signed   By: Randa Ngo M.D.   On: 05/04/2021 22:29    Scheduled Meds:  aspirin EC  81 mg Oral Daily   metoprolol tartrate  50 mg Oral BID   Continuous Infusions:  dextrose 45 mL/hr at 05/15/21 0905     LOS: 2 days   Time spent: 60 mins   Kash Davie Wynetta Emery, MD How to contact the [facility]  Attending or Consulting provider [place] or covering provider during after hours [place], for this patient?  Check the care team in [facility] and look for a) attending/consulting TRH provider listed and b) the [facility] team listed Log into [email or web address] and use [place]'s universal password to access. If  you do not have the password, please contact the hospital operator. Locate the [facility] provider you are looking for under [facility] and page to a number that you can be directly reached. If you still have difficulty reaching the provider, please page the [facility] (Director on Call) for the Hospitalists listed on amion for assistance.  05/15/2021, 10:20 AM

## 2021-05-15 NOTE — Consult Note (Signed)
Consultation Note Date: 05/15/2021   Patient Name: [name]  DOB: [birth year]  MRN: [id number]  Age / Sex: 87 y.o., female  PCP: Loman Brooklyn, FNP Referring Physician: Murlean Iba, MD  Reason for Consultation: Establishing goals of care  HPI/Patient Profile: 87 y.o. female  with past medical history of dementia, breast cancer, diastolic CHF (GIDD per ECHO 03/05/20), hyperlipidemia, hypertension, SVT, vertigo, CKD stage 3a, GERD admitted on 05/03/2021 with rectal bleeding and wheezing and found to have AKI with dehydration. Hemoglobin is stable. Lives alone with 2 hours daily of PCS services but becoming weaker and already working on pursuing placement prior to admission. Recent visit with PCP for UTI symptoms, dark urine, poor intake, confusion and weakness. Also noted trouble swallowing pills. Previous palliative consultation 03/06/20 patient and family accepted out of facility DNR form with ongoing discussions recommended.   Clinical Assessment and Goals of Care: I met today with [name] but no family present. [name] is difficult to arouse and then when I did get her to awaken it was only for a brief time. She denied any discomfort and then went back to sleep. She does seem a bit restless and breathing a little labored after awakening but this calmed as she went back to sleep. Lunch try at bedside with food cut with appearance that she was attempted to be fed but did not consume more than a few bites. RN confirms that she is not really eating, drinking, and sleeping all the time.   I called and spoke with son, [name]. [name] shares that he is HCPOA. We discussed [name]' condition as very poor. He reports that she has been declining for 2-3 weeks significantly as she was doing well and living at home (alone and with minimal assistance) prior to this time. I explained that I worry about her  current condition and ability to improve. I fear that she may be approaching end of life. [name] is surprised but then shares that he knows that his mother is 65 years old. He also shares that his sister-in-law died 2022/10/19 and he does see many of the same symptoms in his mother. [name] shares that he knows his mother would not want to be resuscitated - DNR placed. We discussed plan to move forward and decided to continue current interventions and reassess tomorrow to give her any opportunity for improvement. If no improvement or if worsening may consider comfort measures tomorrow. [name] will reach out to the rest of his family and make them aware of poor prognosis.   All questions/concerns addressed. Emotional support provided.   Primary Decision Maker HCPOA son [name]    SUMMARY OF RECOMMENDATIONS   - DNR - If no improvement or if decline may consider transition to comfort measures  Code Status/Advance Care Planning: DNR   Symptom Management:  Per attending.   Palliative Prophylaxis:  Aspiration, Bowel Regimen, Delirium Protocol, Frequent Pain Assessment, Oral Care, and Turn Reposition  Prognosis:  Overall prognosis poor. Likely approaching days.   Discharge  Planning: To Be Determined      Primary Diagnoses: Present on Admission:  AKI (acute kidney injury) ([place])  Lactic acidosis  GI bleed   I have reviewed the medical record, interviewed the patient and family, and examined the patient. The following aspects are pertinent.  Past Medical History:  Diagnosis Date   Arthralgia    Breast cancer ([place])    left breast    Constipation    Dyspepsia    GERD (gastroesophageal reflux disease)    High cholesterol    Hypertension    NSVT (nonsustained ventricular tachycardia)    Short run of monomorphic VT in setting of hypokalemia during 09/2011 hospitalization. EF 60-65% by echo 09/26/11   Right bundle branch block    SVT (supraventricular tachycardia) ([place])    Noted  09/2011 responsive to cardizem   Vertigo    Social History   Socioeconomic History   Marital status: Widowed    Spouse name: Not on file   Number of children: 4   Years of education: Not on file   Highest education level: Not on file  Occupational History   Not on file  Tobacco Use   Smoking status: Never   Smokeless tobacco: Never  Vaping Use   Vaping Use: Never used  Substance and Sexual Activity   Alcohol use: No   Drug use: No   Sexual activity: Not Currently    Birth control/protection: Post-menopausal  Other Topics Concern   Not on file  Social History Narrative   5 children - 1 passed away,    Lives alone - has alert   Social Determinants of Health   Financial Resource Strain: Not on file  Food Insecurity: Not on file  Transportation Needs: Not on file  Physical Activity: Not on file  Stress: Not on file  Social Connections: Not on file   Family History  Problem Relation Age of Onset   Hypertension Mother    Tuberculosis Father    Hypertension Father    Scheduled Meds:  aspirin EC  81 mg Oral Daily   influenza vaccine adjuvanted  0.5 mL Intramuscular Tomorrow-1000   metoprolol tartrate  50 mg Oral BID   Continuous Infusions:  dextrose     PRN Meds:.acetaminophen **OR** acetaminophen, albuterol, ondansetron **OR** ondansetron (ZOFRAN) IV Allergies  Allergen Reactions   Cozaar [Losartan] Swelling    Oral swelling    Review of Systems  Unable to perform ROS: Acuity of condition   Physical Exam Vitals and nursing note reviewed.  Constitutional:      General: She is sleeping.     Appearance: She is ill-appearing.  Cardiovascular:     Rate and Rhythm: Tachycardia present.  Pulmonary:     Effort: Accessory muscle usage present. No tachypnea or respiratory distress.  Neurological:     Mental Status: She is lethargic and confused.    Vital Signs: BP 127/88 (BP Location: Left Arm)   Pulse (!) 110   Temp 98.9 F (37.2 C)   Resp 20   Ht '5\' 7"'   (1.702 m)   Wt 59.1 kg   SpO2 96%   BMI 20.41 kg/m  Pain Scale: PAINAD   Pain Score: Asleep   SpO2: SpO2: 96 % O2 Device:SpO2: 96 % O2 Flow Rate: .O2 Flow Rate (L/min): 2 L/min  IO: Intake/output summary:  Intake/Output Summary (Last 24 hours) at 05/15/2021 0849 Last data filed at 05/15/2021 0701 Gross per 24 hour  Intake 370.88 ml  Output 200 ml  Net  170.88 ml    LBM: Last BM Date: 05/31/2021 Baseline Weight: Weight: 60.3 kg Most recent weight: Weight: 59.1 kg     Palliative Assessment/Data:     Time Total: 50 min  Greater than 50%  of this time was spent counseling and coordinating care related to the above assessment and plan.  Signed by: Vinie Sill, NP Palliative Medicine Team Pager # [phone] (M-F 8a-5p) Team Phone # [phone] (Nights/Weekends)

## 2021-05-16 DIAGNOSIS — K922 Gastrointestinal hemorrhage, unspecified: Secondary | ICD-10-CM | POA: Diagnosis not present

## 2021-05-16 DIAGNOSIS — E872 Acidosis, unspecified: Secondary | ICD-10-CM | POA: Diagnosis not present

## 2021-05-16 DIAGNOSIS — N179 Acute kidney failure, unspecified: Secondary | ICD-10-CM | POA: Diagnosis not present

## 2021-05-16 LAB — CBC
HCT: 35.9 % — ABNORMAL LOW (ref 36.0–46.0)
Hemoglobin: 10.6 g/dL — ABNORMAL LOW (ref 12.0–15.0)
MCH: 28.2 pg (ref 26.0–34.0)
MCHC: 29.5 g/dL — ABNORMAL LOW (ref 30.0–36.0)
MCV: 95.5 fL (ref 80.0–100.0)
Platelets: 134 10*3/uL — ABNORMAL LOW (ref 150–400)
RBC: 3.76 MIL/uL — ABNORMAL LOW (ref 3.87–5.11)
RDW: 15.3 % (ref 11.5–15.5)
WBC: 6.4 10*3/uL (ref 4.0–10.5)
nRBC: 1.4 % — ABNORMAL HIGH (ref 0.0–0.2)

## 2021-05-16 LAB — BASIC METABOLIC PANEL
Anion gap: 6 (ref 5–15)
BUN: 70 mg/dL — ABNORMAL HIGH (ref 8–23)
CO2: 29 mmol/L (ref 22–32)
Calcium: 8.7 mg/dL — ABNORMAL LOW (ref 8.9–10.3)
Chloride: 108 mmol/L (ref 98–111)
Creatinine, Ser: 1.48 mg/dL — ABNORMAL HIGH (ref 0.44–1.00)
GFR, Estimated: 32 mL/min — ABNORMAL LOW (ref >60)
Glucose, Bld: 147 mg/dL — ABNORMAL HIGH (ref 70–99)
Potassium: 4.8 mmol/L (ref 3.5–5.1)
Sodium: 143 mmol/L (ref 135–145)

## 2021-05-16 MED ORDER — METOPROLOL TARTRATE 5 MG/5ML IV SOLN
INTRAVENOUS | Status: AC
  Filled 2021-05-16: qty 5

## 2021-05-16 MED ORDER — METOPROLOL TARTRATE 5 MG/5ML IV SOLN
2.5000 mg | Freq: Once | INTRAVENOUS | Status: AC
  Administered 2021-05-16: 2.5 mg via INTRAVENOUS

## 2021-05-16 MED ORDER — HYDROMORPHONE BOLUS VIA INFUSION: 0.2000 mg | INTRAVENOUS | Status: DC | PRN

## 2021-05-16 MED ORDER — GLYCOPYRROLATE 0.2 MG/ML IJ SOLN: 0.2000 mg | INTRAMUSCULAR | Status: DC | PRN

## 2021-05-16 MED ORDER — MORPHINE 100MG IN NS 100ML (1MG/ML) PREMIX INFUSION
0.5000 mg/h | INTRAVENOUS | Status: DC
  Administered 2021-05-16: 0.5 mg/h via INTRAVENOUS
  Filled 2021-05-16: qty 100

## 2021-05-16 MED ORDER — SODIUM CHLORIDE 0.9 % IV SOLN: 0.5000 mg/h | INTRAVENOUS | Status: DC

## 2021-05-16 MED ORDER — MORPHINE BOLUS VIA INFUSION
0.5000 mg | INTRAVENOUS | Status: DC | PRN
  Filled 2021-05-16: qty 1

## 2021-05-16 MED ORDER — GLYCOPYRROLATE 1 MG PO TABS: 1.0000 mg | ORAL_TABLET | ORAL | Status: DC | PRN

## 2021-05-16 MED ORDER — POLYVINYL ALCOHOL 1.4 % OP SOLN: 1.0000 [drp] | Freq: Four times a day (QID) | OPHTHALMIC | Status: DC | PRN

## 2021-05-16 NOTE — Progress Notes (Signed)
PROGRESS NOTE   [name]  PQZ:[id number] DOB: 06/17/1920 DOA: 05/10/2021 PCP: Loman Brooklyn, FNP   Chief Complaint  Patient presents with   Shortness of Breath   Level of care: Med-Surg  Brief Admission History:  87 y.o. female, with history of breast cancer, GERD, hyperlipidemia, hypertension, SVT, vertigo, and more presents ED with a chief complaint of rectal bleeding versus wheezing.  According to triage patient was brought in via EMS for shortness of breath and weakness.  When EMS arrived to her her O2 sats were in the 55s and 90s.  She was given albuterol neb in route.  Family had reported to the EMS the patient has had a steady decline over the past week.  The ED provider spoke with family they reported that the caregiver noticed bloody stools for the past 2 or 3 days.  Patient herself has had no complaints.  Unfortunately patient is not able to give any history secondary to dementia.  In the ED she was found to be afebrile with stable heart rate, intermittent tachypnea up to 25, blood pressure stable 118/48, satting at 95% on 2.5 L nasal cannula.  No leukocytosis with white blood cell count 5.8, hemoglobin 10.7.  Slightly elevated potassium of 5.6, AKI is revealed with a creatinine of 1.8, baseline 0.8.  BNP is significantly elevated at 1139, prior was in the 200s.  Troponins are 51 and 49.  Lactic acid is 2.3, but improved to 1.8 after 1 L of fluid.   On 10/14-10/15 patient started having bouts of rapid afib with RVR and developed acute encephalopathy and suspect had an acute CVA.  I spoke with her son and he was agreeable to full comfort measures as patient is actively dying.     Assessment & Plan:   Active Problems:   AKI (acute kidney injury) ([place])   Lactic acidosis   GI bleed   Anemia   Acute encephalopathy  - I am concerned patient has had a stroke and appears to be actively dying - I called and spoke with son and with patient becoming worse agreed to pursue full  comfort measures - Pt is grimacing and groaning at times, we have started on IV morphine infusion   Suspected acute CVA - family wants to pursue full comfort measures and no further diagnostic testing - with paroxsmal atrial fib I feel she likely had embolic CVA  AKI - poor oral intake over past weeks - full comfort measures   Anemia and GI bleed - full comfort measures no invasive testing   Code Status: DNR  Family Communication: son Education officer, community) updated by telephone Disposition: anticipating hospital death  Status is: Inpatient  Remains inpatient appropriate because: IV pain management   Consultants:  Palliative care   Procedures:  N/a  Antimicrobials:  N/a   Subjective: Patient acutely had a change in status and appears to be actively dying  Objective: Vitals:   05/15/21 1437 05/15/21 2027 05/16/21 0200 05/16/21 0320  BP: (!) 148/79 136/76  (!) 165/67  Pulse: (!) 108 (!) 101 85 88  Resp: 20 15  19   Temp: 98 F (36.7 C) 97.9 F (36.6 C)    TempSrc:  Oral    SpO2: 98% 95%  95%  Weight:      Height:        Intake/Output Summary (Last 24 hours) at 05/16/2021 1811 Last data filed at 05/16/2021 1600 Gross per 24 hour  Intake 1033.93 ml  Output 700 ml  Net  333.93 ml   Filed Weights   05/12/2021 1753 05/14/21 1255  Weight: 60.3 kg 59.1 kg    Examination:  General exam: Pt appears lethargic and unresponsive with facial droop and protruding tongue Respiratory system: Clear to auscultation. Respiratory effort normal. Cardiovascular system: Irregularly irregular and tachycardia normal S1 & S2 heard.  No pedal edema. Gastrointestinal system: Abdomen is nondistended, soft and nontender. No organomegaly or masses felt. Normal bowel sounds heard. Central nervous system: Lethargic with limp extremities Extremities: No pretibial edema Skin: No rashes, lesions or ulcers Psychiatry: Judgement and insight unable to determine  Data Reviewed: I have personally reviewed  following labs and imaging studies  CBC: Recent Labs  Lab 05/14/2021 1813 05/14/21 0428 05/15/21 1048 05/16/21 0438  WBC 5.8 4.2 6.0 6.4  NEUTROABS 4.8  --   --   --   HGB 10.7* 11.9* 11.0* 10.6*  HCT 34.7* 39.0 37.1 35.9*  MCV 92.0 92.4 95.1 95.5  PLT 128* 125* 122* 134*    Basic Metabolic Panel: Recent Labs  Lab 05/26/2021 1813 05/14/21 0428 05/15/21 0639 05/16/21 0438  NA 140 142 146* 143  K 5.6* 5.0 4.9 4.8  CL 103 108 109 108  CO2 28 24 31 29   GLUCOSE 162* 161* 105* 147*  BUN 85* 74* 70* 70*  CREATININE 1.80* 1.42* 1.44* 1.48*  CALCIUM 8.7* 8.6* 8.8* 8.7*  MG  --  2.7* 2.8*  --     GFR: Estimated Creatinine Clearance: 19.3 mL/min (A) (by C-G formula based on SCr of 1.48 mg/dL (H)).  Liver Function Tests: Recent Labs  Lab 05/14/21 0428 05/15/21 0639  AST 113* 90*  ALT 218* 188*  ALKPHOS 209* 180*  BILITOT 0.8 1.0  PROT 7.3 6.8  ALBUMIN 3.5 3.3*    CBG: No results for input(s): GLUCAP in the last 168 hours.  Recent Results (from the past 240 hour(s))  Culture, blood (routine x 2)     Status: None (Preliminary result)   Collection Time: 05/18/2021  6:18 PM   Specimen: Left Antecubital; Blood  Result Value Ref Range Status   Specimen Description LEFT ANTECUBITAL  Final   Special Requests   Final    Blood Culture adequate volume BOTTLES DRAWN AEROBIC AND ANAEROBIC   Culture   Final    NO GROWTH 3 DAYS Performed at [facility], [address], [place], [place] [address]    Report Status PENDING  Incomplete  Resp Panel by RT-PCR (Flu A&B, Covid) Nasopharyngeal Swab     Status: None   Collection Time: 05/08/2021  6:33 PM   Specimen: Nasopharyngeal Swab; Nasopharyngeal(NP) swabs in vial transport medium  Result Value Ref Range Status   SARS Coronavirus 2 by RT PCR NEGATIVE NEGATIVE Final    Comment: (NOTE) SARS-CoV-2 target nucleic acids are NOT DETECTED.  The SARS-CoV-2 RNA is generally detectable in upper respiratory specimens during the acute phase  of infection. The lowest concentration of SARS-CoV-2 viral copies this assay can detect is 138 copies/mL. A negative result does not preclude SARS-Cov-2 infection and should not be used as the sole basis for treatment or other patient management decisions. A negative result may occur with  improper specimen collection/handling, submission of specimen other than nasopharyngeal swab, presence of viral mutation(s) within the areas targeted by this assay, and inadequate number of viral copies(<138 copies/mL). A negative result must be combined with clinical observations, patient history, and epidemiological information. The expected result is Negative.  Fact Sheet for Patients:  EntrepreneurPulse.com.au  Fact Sheet  for Healthcare Providers:  IncredibleEmployment.be  This test is no t yet approved or cleared by the [place] and  has been authorized for detection and/or diagnosis of SARS-CoV-2 by FDA under an Emergency Use Authorization (EUA). This EUA will remain  in effect (meaning this test can be used) for the duration of the COVID-19 declaration under Section 564(b)(1) of the Act, 21 U.S.C.section 360bbb-3(b)(1), unless the authorization is terminated  or revoked sooner.       Influenza A by PCR NEGATIVE NEGATIVE Final   Influenza B by PCR NEGATIVE NEGATIVE Final    Comment: (NOTE) The Xpert Xpress SARS-CoV-2/FLU/RSV plus assay is intended as an aid in the diagnosis of influenza from Nasopharyngeal swab specimens and should not be used as a sole basis for treatment. Nasal washings and aspirates are unacceptable for Xpert Xpress SARS-CoV-2/FLU/RSV testing.  Fact Sheet for Patients: EntrepreneurPulse.com.au  Fact Sheet for Healthcare Providers: IncredibleEmployment.be  This test is not yet approved or cleared by the [place] FDA and has been authorized for detection and/or diagnosis of  SARS-CoV-2 by FDA under an Emergency Use Authorization (EUA). This EUA will remain in effect (meaning this test can be used) for the duration of the COVID-19 declaration under Section 564(b)(1) of the Act, 21 U.S.C. section 360bbb-3(b)(1), unless the authorization is terminated or revoked.  Performed at [facility], [address] [place], [place] [address]   Culture, blood (routine x 2)     Status: None (Preliminary result)   Collection Time: 05/02/2021  7:06 PM   Specimen: BLOOD RIGHT HAND  Result Value Ref Range Status   Specimen Description BLOOD RIGHT HAND  Final   Special Requests   Final    Blood Culture adequate volume BOTTLES DRAWN AEROBIC AND ANAEROBIC   Culture   Final    NO GROWTH 3 DAYS Performed at [facility], [address] [place], [place] [address]    Report Status PENDING  Incomplete     Radiology Studies: No results found.  Scheduled Meds:  metoprolol tartrate       Continuous Infusions:  morphine 0.5 mg/hr (05/16/21 1600)     LOS: 3 days   Time spent: 35 mins   Jaylean Buenaventura Wynetta Emery, MD How to contact the [facility] Attending or Consulting provider [place] or covering provider during after hours [place], for this patient?  Check the care team in [facility] and look for a) attending/consulting TRH provider listed and b) the [facility] team listed Log into [email or web address] and use 's universal password to access. If you do not have the password, please contact the hospital operator. Locate the [facility] provider you are looking for under [facility] and page to a number that you can be directly reached. If you still have difficulty reaching the provider, please page the [facility] (Director on Call) for the Hospitalists listed on amion for assistance.  05/16/2021, 6:11 PM

## 2021-05-16 NOTE — Progress Notes (Signed)
Patient has been having some periods of afib non sustained got up to 120-130 for a few seconds now back to NSR 75-85/. Vitals taken and found to be as follows BP 118/72 HR 88, o2 sats 99% on 2l temp 98.2. provider made aware via text. No new orders initiated at this time. I will continue top monitor patient

## 2021-05-16 NOTE — Progress Notes (Signed)
Patient sustaining a heart rate of 140-150's for > 40mins. Provider ( Dr Earnest Conroy) texted and informed of change. Order given for IV lopressor. Vitals as followed BP 148/72 temp 98.2 sats 99% on 2L HR 122. Noted left JVD distention. Patient will be monitored as the shift progresses. AM nurse will be briefed on the above current clinical status for on going management as needed.

## 2021-05-17 DIAGNOSIS — I6340 Cerebral infarction due to embolism of unspecified cerebral artery: Secondary | ICD-10-CM | POA: Diagnosis not present

## 2021-05-17 DIAGNOSIS — N179 Acute kidney failure, unspecified: Secondary | ICD-10-CM | POA: Diagnosis not present

## 2021-05-17 DIAGNOSIS — D649 Anemia, unspecified: Secondary | ICD-10-CM | POA: Diagnosis not present

## 2021-05-17 DIAGNOSIS — I639 Cerebral infarction, unspecified: Secondary | ICD-10-CM | POA: Clinically undetermined

## 2021-05-17 DIAGNOSIS — E872 Acidosis, unspecified: Secondary | ICD-10-CM | POA: Diagnosis not present

## 2021-05-18 LAB — CULTURE, BLOOD (ROUTINE X 2)
Culture: NO GROWTH
Culture: NO GROWTH
Special Requests: ADEQUATE
Special Requests: ADEQUATE

## 2021-06-02 NOTE — TOC Progression Note (Signed)
Transition of Care [facility]) - Progression Note    Patient Details  Name: [name] MRN: [id number] Date of Birth: [birth year]  Transition of Care [facility]) CM/SW [place], LCSW Phone Number: 06/11/2021, 2:51 PM  Clinical Narrative:    CSW notified of patient's comfort care status and families requested for residential hospice. CSW placed referral for residential hospice with [facility] hospice. Robin with [facility] hospice reported that they had a home hospice patient that is scheduled to take next available bed. Shirlean Mylar reported that she will follow up when another bed becomes available. TOC to follow.    Expected Discharge Plan: [place] Barriers to Discharge: Continued Medical Work up  Expected Discharge Plan and Services Expected Discharge Plan: [place] In-house Referral: Clinical Social Work   Post Acute Care Choice: [place] Living arrangements for the past 2 months: Single Family Home                 DME Arranged: N/A                     Social Determinants of Health (SDOH) Interventions    Readmission Risk Interventions No flowsheet data found.

## 2021-06-02 NOTE — Death Summary Note (Signed)
DEATH SUMMARY   Patient Details  Name: [name] MRN: [id number] DOB: [birth year]  Admission/Discharge Information   Admit Date:  May 27, 2021  Date of Death: Date of Death: 2021-05-31  Time of Death: Time of Death: 10/30/1336  Length of Stay: 4  Referring Physician: Loman Brooklyn, FNP   Reason(s) for Hospitalization  ADMISSION HPI:  [name]  is a 87 y.o. female, with history of breast cancer, GERD, hyperlipidemia, hypertension, SVT, vertigo, and more presents ED with a chief complaint of rectal bleeding versus wheezing.  According to triage patient was brought in via EMS for shortness of breath and weakness.  When EMS arrived to her her O2 sats were in the 58s and 90s.  She was given albuterol neb in route.  Family had reported to the EMS the patient has had a steady decline over the past week.  The ED provider spoke with family they reported that the caregiver noticed bloody stools for the past 2 or 3 days.  Patient herself has had no complaints.  Unfortunately patient is not able to give any history secondary to dementia.  In the ED she was found to be afebrile with stable heart rate, intermittent tachypnea up to 25, blood pressure stable 118/48, satting at 95% on 2.5 L nasal cannula No leukocytosis with white blood cell count 5.8, hemoglobin 10.7 Slightly elevated potassium of 5.6, AKI is revealed with a creatinine of 1.8, baseline 0.8 BNP is significantly elevated at 1139, prior was in the 200s Troponins are 51 and 49 Lactic acid is 2.3, but improved to 1.8 after 1 L of fluid  EKG shows wide-complex tachycardia with a rate of 138, QTc 485, Chest x-ray shows no acute intrathoracic process Patient was given albuterol, DuoNeb, Solu-Medrol, 1 L normal saline.  Patient had negative respiratory panel. Admission requested for lactic acidosis and acute respiratory failure with hypoxia as well as AKI   Diagnoses  Preliminary cause of death: acute CVA  Secondary Diagnoses (including  complications and co-morbidities):  Active Problems:   AKI (acute kidney injury) ([place])   Lactic acidosis   GI bleed   Anemia   Hypernatremia     DNR    Full comfort measures    Acute metabolic encephalopathy     Paroxysmal atrial fibrillation with RVR    Transaminitis     Thrombocytopenia   Brief Hospital Course (including significant findings, care, treatment, and services provided and events leading to death)  [name] is a 87 y.o. year old female who was admitted with failure to thrive, severe dehydration, acute kidney injury, hypernatremia developed an acute encephalopathy suspected to be an acute CVA and never regained consciousness.  Pt had developed paroxsmal atrial fibrillation with RVR which likely caused an embolic stroke.  After speaking with her son healthcare power of attorney decision was made to pursue full comfort measures as patient was actively dying.  Patient was placed on IV morphine infusion and full comfort care.  Patient expired on 05-31-2021 at 1338.  Pertinent Labs and Studies  Significant Diagnostic Studies CT ABDOMEN PELVIS W CONTRAST  Result Date: 05/05/2021 CLINICAL DATA:  Abdominal pain with nausea. EXAM: CT ABDOMEN AND PELVIS WITH CONTRAST TECHNIQUE: Multidetector CT imaging of the abdomen and pelvis was performed using the standard protocol following bolus administration of intravenous contrast. CONTRAST:  150mL OMNIPAQUE IOHEXOL 300 MG/ML  SOLN COMPARISON:  03/04/2020. FINDINGS: Lower chest: No acute abnormality. Hepatobiliary: No focal liver abnormality is seen. Status post cholecystectomy. No biliary dilatation.  Pancreas: Unremarkable. No pancreatic ductal dilatation or surrounding inflammatory changes. Spleen: Normal in size without focal abnormality. Adrenals/Urinary Tract: No adrenal masses. Kidneys normal in position and orientation. Symmetric renal enhancement and excretion. Several subcentimeter low-density renal masses consistent with cysts. No  stones. No hydronephrosis. Normal ureters. Normal bladder. Stomach/Bowel: Unremarkable stomach. Small bowel and colon are normal in caliber. No wall thickening. No inflammation. Normal appendix visualized. Vascular/Lymphatic: Aortic atherosclerosis. No enlarged lymph nodes. Reproductive: Round mass, 5.4 cm, containing dystrophic calcifications, arises in the posterior mid to upper uterine segment. Is small amount of fluid and air within the endometrial canal. No adnexal masses. Pessary lies in the upper vagina. Other: No abdominal wall hernia or abnormality. No abdominopelvic ascites. Musculoskeletal: Moderate compression fracture of L1, increased in severity compared to the prior CT. No other fractures. No bone lesions. IMPRESSION: 1. Moderate compression fracture of L1, likely chronic, but increased in severity compared to the prior CT. Consider recent additional fracturing of this vertebra if there are symptoms of new back pain. 2. No definite acute abnormality within the abdomen or pelvis. No evidence of an abscess. 3. 5.4 cm uterine mass consistent with a submucosal fibroid, similar to the prior CT. 4. Aortic atherosclerosis. Electronically Signed   By: Lajean Manes M.D.   On: 05/05/2021 13:56   DG Chest Port 1 View  Result Date: 05/14/2021 CLINICAL DATA:  Short of breath, history of left breast cancer, hypoxia EXAM: PORTABLE CHEST 1 VIEW COMPARISON:  05/26/2020 FINDINGS: Single frontal view of the chest demonstrates an unremarkable cardiac silhouette. Atherosclerosis of the aortic arch. No acute airspace disease, effusion, or pneumothorax. No acute bony abnormalities. IMPRESSION: 1. No acute intrathoracic process. Electronically Signed   By: Randa Ngo M.D.   On: 05/21/2021 22:29    Microbiology Recent Results (from the past 240 hour(s))  Culture, blood (routine x 2)     Status: None (Preliminary result)   Collection Time: 05/03/2021  6:18 PM   Specimen: Left Antecubital; Blood  Result Value Ref  Range Status   Specimen Description LEFT ANTECUBITAL  Final   Special Requests   Final    Blood Culture adequate volume BOTTLES DRAWN AEROBIC AND ANAEROBIC   Culture   Final    NO GROWTH 4 DAYS Performed at [facility], [address] [place], [place] [address]    Report Status PENDING  Incomplete  Resp Panel by RT-PCR (Flu A&B, Covid) Nasopharyngeal Swab     Status: None   Collection Time: 05/21/2021  6:33 PM   Specimen: Nasopharyngeal Swab; Nasopharyngeal(NP) swabs in vial transport medium  Result Value Ref Range Status   SARS Coronavirus 2 by RT PCR NEGATIVE NEGATIVE Final    Comment: (NOTE) SARS-CoV-2 target nucleic acids are NOT DETECTED.  The SARS-CoV-2 RNA is generally detectable in upper respiratory specimens during the acute phase of infection. The lowest concentration of SARS-CoV-2 viral copies this assay can detect is 138 copies/mL. A negative result does not preclude SARS-Cov-2 infection and should not be used as the sole basis for treatment or other patient management decisions. A negative result may occur with  improper specimen collection/handling, submission of specimen other than nasopharyngeal swab, presence of viral mutation(s) within the areas targeted by this assay, and inadequate number of viral copies(<138 copies/mL). A negative result must be combined with clinical observations, patient history, and epidemiological information. The expected result is Negative.  Fact Sheet for Patients:  EntrepreneurPulse.com.au  Fact Sheet for Healthcare Providers:  IncredibleEmployment.be  This test is no  t yet approved or cleared by the [place] and  has been authorized for detection and/or diagnosis of SARS-CoV-2 by FDA under an Emergency Use Authorization (EUA). This EUA will remain  in effect (meaning this test can be used) for the duration of the COVID-19 declaration under Section 564(b)(1) of the Act, 21 U.S.C.section  360bbb-3(b)(1), unless the authorization is terminated  or revoked sooner.       Influenza A by PCR NEGATIVE NEGATIVE Final   Influenza B by PCR NEGATIVE NEGATIVE Final    Comment: (NOTE) The Xpert Xpress SARS-CoV-2/FLU/RSV plus assay is intended as an aid in the diagnosis of influenza from Nasopharyngeal swab specimens and should not be used as a sole basis for treatment. Nasal washings and aspirates are unacceptable for Xpert Xpress SARS-CoV-2/FLU/RSV testing.  Fact Sheet for Patients: EntrepreneurPulse.com.au  Fact Sheet for Healthcare Providers: IncredibleEmployment.be  This test is not yet approved or cleared by the [place] FDA and has been authorized for detection and/or diagnosis of SARS-CoV-2 by FDA under an Emergency Use Authorization (EUA). This EUA will remain in effect (meaning this test can be used) for the duration of the COVID-19 declaration under Section 564(b)(1) of the Act, 21 U.S.C. section 360bbb-3(b)(1), unless the authorization is terminated or revoked.  Performed at [facility], [address] [place], [place] [address]   Culture, blood (routine x 2)     Status: None (Preliminary result)   Collection Time: 05/12/2021  7:06 PM   Specimen: BLOOD RIGHT HAND  Result Value Ref Range Status   Specimen Description BLOOD RIGHT HAND  Final   Special Requests   Final    Blood Culture adequate volume BOTTLES DRAWN AEROBIC AND ANAEROBIC   Culture   Final    NO GROWTH 4 DAYS Performed at [facility], [address] [place], [place] [address]    Report Status PENDING  Incomplete    Lab Basic Metabolic Panel: Recent Labs  Lab 05/26/2021 1813 05/14/21 0428 05/15/21 0639 05/16/21 0438  NA 140 142 146* 143  K 5.6* 5.0 4.9 4.8  CL 103 108 109 108  CO2 28 24 31 29   GLUCOSE 162* 161* 105* 147*  BUN 85* 74* 70* 70*  CREATININE 1.80* 1.42* 1.44* 1.48*  CALCIUM 8.7* 8.6* 8.8* 8.7*  MG  --  2.7* 2.8*  --    Liver  Function Tests: Recent Labs  Lab 05/14/21 0428 05/15/21 0639  AST 113* 90*  ALT 218* 188*  ALKPHOS 209* 180*  BILITOT 0.8 1.0  PROT 7.3 6.8  ALBUMIN 3.5 3.3*   No results for input(s): LIPASE, AMYLASE in the last 168 hours. No results for input(s): AMMONIA in the last 168 hours. CBC: Recent Labs  Lab 05/05/2021 1813 05/14/21 0428 05/15/21 1048 05/16/21 0438  WBC 5.8 4.2 6.0 6.4  NEUTROABS 4.8  --   --   --   HGB 10.7* 11.9* 11.0* 10.6*  HCT 34.7* 39.0 37.1 35.9*  MCV 92.0 92.4 95.1 95.5  PLT 128* 125* 122* 134*   Cardiac Enzymes: No results for input(s): CKTOTAL, CKMB, CKMBINDEX, TROPONINI in the last 168 hours. Sepsis Labs: Recent Labs  Lab 05/10/2021 1813 05/21/2021 2029 05/12/2021 2305 05/14/21 0215 05/14/21 0428 05/15/21 1048 05/16/21 0438  WBC 5.8  --   --   --  4.2 6.0 6.4  LATICACIDVEN 1.7 2.3* 2.3* 1.8  --   --   --     Procedures/Operations   Irwin Brakeman MD 05-22-21, 2:57 PM How to contact  the [facility] Attending or Consulting provider [place] or covering provider during after hours [place], for this patient?  Check the care team in [facility] and look for a) attending/consulting [facility] provider listed and b) the [facility] team listed Log into [email or web address] and use [place]'s universal password to access. If you do not have the password, please contact the hospital operator. Locate the [facility] provider you are looking for under [facility] and page to a number that you can be directly reached. If you still have difficulty reaching the provider, please page the [facility] (Director on Call) for the Hospitalists listed on amion for assistance.

## 2021-06-02 DEATH — deceased

## 2021-07-28 ENCOUNTER — Ambulatory Visit: Payer: Medicare Other | Admitting: Family Medicine

## 2021-08-04 ENCOUNTER — Ambulatory Visit: Payer: Medicare Other | Admitting: Obstetrics & Gynecology
